# Patient Record
Sex: Male | Born: 1945 | Race: White | Hispanic: No | Marital: Married | Smoking: Former smoker
Health system: Southern US, Community
[De-identification: ages and names within clinical notes are randomized; demographics above are authoritative.]

## PROBLEM LIST (undated history)

## (undated) DIAGNOSIS — Z9289 Personal history of other medical treatment: Secondary | ICD-10-CM

## (undated) DIAGNOSIS — I509 Heart failure, unspecified: Secondary | ICD-10-CM

## (undated) DIAGNOSIS — E119 Type 2 diabetes mellitus without complications: Secondary | ICD-10-CM

## (undated) DIAGNOSIS — Z85828 Personal history of other malignant neoplasm of skin: Secondary | ICD-10-CM

## (undated) DIAGNOSIS — D649 Anemia, unspecified: Secondary | ICD-10-CM

## (undated) DIAGNOSIS — R0600 Dyspnea, unspecified: Secondary | ICD-10-CM

## (undated) DIAGNOSIS — E785 Hyperlipidemia, unspecified: Secondary | ICD-10-CM

## (undated) DIAGNOSIS — T8859XA Other complications of anesthesia, initial encounter: Secondary | ICD-10-CM

## (undated) DIAGNOSIS — Z8042 Family history of malignant neoplasm of prostate: Secondary | ICD-10-CM

## (undated) DIAGNOSIS — I480 Paroxysmal atrial fibrillation: Secondary | ICD-10-CM

## (undated) DIAGNOSIS — J189 Pneumonia, unspecified organism: Secondary | ICD-10-CM

## (undated) HISTORY — PX: BASAL CELL CARCINOMA EXCISION: SHX1214

## (undated) HISTORY — DX: Family history of malignant neoplasm of prostate: Z80.42

## (undated) HISTORY — PX: VASECTOMY: SHX75

## (undated) HISTORY — PX: COLONOSCOPY: SHX174

## (undated) HISTORY — DX: Type 2 diabetes mellitus without complications: E11.9

## (undated) HISTORY — DX: Personal history of other medical treatment: Z92.89

## (undated) HISTORY — PX: SKIN GRAFT: SHX250

## (undated) HISTORY — DX: Personal history of other malignant neoplasm of skin: Z85.828

## (undated) HISTORY — DX: Paroxysmal atrial fibrillation: I48.0

## (undated) HISTORY — DX: Hyperlipidemia, unspecified: E78.5

---

## 1991-12-14 HISTORY — PX: ANAL FISSURE REPAIR: SHX2312

## 2017-04-28 ENCOUNTER — Encounter: Payer: Self-pay | Admitting: Physician Assistant

## 2017-04-29 ENCOUNTER — Ambulatory Visit (INDEPENDENT_AMBULATORY_CARE_PROVIDER_SITE_OTHER): Payer: Medicare Other | Admitting: Physician Assistant

## 2017-04-29 ENCOUNTER — Encounter: Payer: Self-pay | Admitting: Physician Assistant

## 2017-04-29 ENCOUNTER — Encounter (INDEPENDENT_AMBULATORY_CARE_PROVIDER_SITE_OTHER): Payer: Self-pay

## 2017-04-29 VITALS — BP 136/70 | HR 73 | Ht 70.5 in | Wt 185.2 lb

## 2017-04-29 DIAGNOSIS — R0609 Other forms of dyspnea: Secondary | ICD-10-CM

## 2017-04-29 DIAGNOSIS — I480 Paroxysmal atrial fibrillation: Secondary | ICD-10-CM | POA: Diagnosis not present

## 2017-04-29 DIAGNOSIS — E119 Type 2 diabetes mellitus without complications: Secondary | ICD-10-CM

## 2017-04-29 DIAGNOSIS — R0683 Snoring: Secondary | ICD-10-CM

## 2017-04-29 MED ORDER — APIXABAN 5 MG PO TABS: 5.0000 mg | ORAL_TABLET | Freq: Two times a day (BID) | ORAL | 3 refills | Status: DC
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
## 2017-04-29 NOTE — Patient Instructions (Signed)
Medication Instructions:  1. STOP ASPIRIN   2. A REFILL HAS BEEN SENT IN FOR ELIQUIS   Labwork: 1. BMET, CBC TO BE DONE IN 6 WEEKS 06/10/17   Testing/Procedures: 1. Your physician has requested that you have an echocardiogram. Echocardiography is a painless test that uses sound waves to create images of your heart. It provides your doctor with information about the size and shape of your heart and how well your heart's chambers and valves are working. This procedure takes approximately one hour. There are no restrictions for this procedure.   2. Your physician has requested that you have en exercise stress myoview. For further information please visit [email or web address]. Please follow instruction sheet, as given.      Follow-Up: DR. Meda Coffee 3 MONTHS   Any Other Special Instructions Will Be Listed Below (If Applicable).     If you need a refill on your cardiac medications before your next appointment, please call your pharmacy.

## 2017-05-02 ENCOUNTER — Telehealth: Payer: Self-pay | Admitting: Physician Assistant

## 2017-05-02 MED ORDER — METOPROLOL SUCCINATE ER 25 MG PO TB24: 25.0000 mg | ORAL_TABLET | Freq: Every day | ORAL | 3 refills | Status: DC

## 2017-05-02 NOTE — Telephone Encounter (Signed)
I called pt and went over recommendations per Brynda Rim. PA of what would be acceptable for the pt to take for his cold. I did advise pt per PA to start Metoprolol Succinate 25 mg 1 tablet daily, Rx has been sent in to [facility] on [address]. Pt advised we do not need to schedule an appt for an EKG to be done in 2 weeks since he is having a Myoview and Echo 05/12/17. Advised per Brynda Rim. PA ok to continue with testing as planned on 05/12/17. Pt advised he can also check with pharmacist as to OTC cold medications and if so to be sure to let the pharm-d know that he has a hx of A-fib. Pt thanked me for my time and call tonight.

## 2017-05-02 NOTE — Telephone Encounter (Signed)
I called pt back in regards to his c/o HR still running high. I asked pt what has his heart rate been running. Pt states in the 80's and 90's. I did explain to the pt that these are normal ranges, normal range 60-100 BPM. I did ask pt has he had chest pain, sob, n/v, perfuse sweating, fevers, pt answered no not really. He said he has a cold right now that started about 2 days and so. I asked pt if taking any cold medicine, pt answered yes. Pt states he has taken Delsym cough syrup. I explained to the pt that some cold medicine contain an ingredient called guaifenesin which can raise heart rate and BP. I looked up Delsym and I did not see guaifenesin in this cough syrup. I stated to the pt that I will d/w Brynda Rim. PA for further advice about heart rate. I explained to the pt I will call back later today. Pt thanked me for my time and help in the matter. It was discussed Friday 04/29/17 of PA wanting to start pt on a low dose beta blocker, though pt declined at the time.

## 2017-05-02 NOTE — Telephone Encounter (Signed)
He is probably back in atrial fibrillation.  Guaifenesin will not increase blood pressure or heart rate.  However, I would avoid decongestants which can increase blood pressure and heart rate and may cause him to go back into atrial fibrillation.   Coricidin HBP is ok to use for cold symptoms. Robitussin with only guaifenesin or guaifenesin and dextromethorphan is ok. Tylenol, Flonase is ok. Do not take anything with phenylephrine or pseudoephedrine in it.  Some formulations of Delsym may have this. He can always ask the pharmacist about OTC medications and tell them that he has a hx of atrial fibrillation.   PLAN: 1. Start Metoprolol Succinate 25 mg Once daily. 2. Return in 2 weeks for ECG.  3. If his HR runs faster, call for sooner appointment. Richardson Dopp, PA-C    05/02/2017 5:20 PM

## 2017-05-02 NOTE — Telephone Encounter (Signed)
Follow Up:    Pt saw Scott on Friday,he wanted you to know that his pulse rate is still high. Please call to advise.

## 2017-05-10 ENCOUNTER — Telehealth (HOSPITAL_COMMUNITY): Payer: Self-pay | Admitting: *Deleted

## 2017-05-10 NOTE — Telephone Encounter (Signed)
Patient given detailed instructions per Myocardial Perfusion Study Information Sheet for the test on 05/12/17 at 0715. Patient notified to arrive 15 minutes early and that it is imperative to arrive on time for appointment to keep from having the test rescheduled.  If you need to cancel or reschedule your appointment, please call the office within 24 hours of your appointment. . Patient verbalized understanding.Amiere Cawley, Ranae Palms

## 2017-05-12 ENCOUNTER — Telehealth: Payer: Self-pay | Admitting: *Deleted

## 2017-05-12 ENCOUNTER — Ambulatory Visit (HOSPITAL_BASED_OUTPATIENT_CLINIC_OR_DEPARTMENT_OTHER): Payer: Medicare Other

## 2017-05-12 ENCOUNTER — Encounter: Payer: Self-pay | Admitting: Physician Assistant

## 2017-05-12 ENCOUNTER — Ambulatory Visit (HOSPITAL_COMMUNITY): Payer: Medicare Other | Attending: Internal Medicine

## 2017-05-12 ENCOUNTER — Other Ambulatory Visit: Payer: Self-pay

## 2017-05-12 DIAGNOSIS — R0609 Other forms of dyspnea: Secondary | ICD-10-CM | POA: Diagnosis not present

## 2017-05-12 DIAGNOSIS — I480 Paroxysmal atrial fibrillation: Secondary | ICD-10-CM | POA: Diagnosis present

## 2017-05-12 LAB — MYOCARDIAL PERFUSION IMAGING
CHL CUP NUCLEAR SDS: 3
CHL CUP RESTING HR STRESS: 70 {beats}/min
CSEPPHR: 141 {beats}/min
Estimated workload: 7 METS
Exercise duration (min): 6 min
Exercise duration (sec): 0 s
LVDIAVOL: 81 mL (ref 62–150)
LVSYSVOL: 32 mL
MPHR: 147 {beats}/min
Percent HR: 96 %
RATE: 0.29
RPE: 18
SRS: 9
SSS: 12
TID: 0.88

## 2017-05-12 LAB — ECHOCARDIOGRAM COMPLETE
HEIGHTINCHES: 70 in
WEIGHTICAEL: 2960 [oz_av]

## 2017-05-12 MED ORDER — REGADENOSON 0.4 MG/5ML IV SOLN: 0.4000 mg | Freq: Once | INTRAVENOUS | Status: AC

## 2017-05-12 MED ORDER — TECHNETIUM TC 99M TETROFOSMIN IV KIT
10.7000 | PACK | Freq: Once | INTRAVENOUS | Status: AC | PRN
  Administered 2017-05-12: 10.7 via INTRAVENOUS
  Filled 2017-05-12: qty 11

## 2017-05-12 MED ORDER — TECHNETIUM TC 99M TETROFOSMIN IV KIT
32.8000 | PACK | Freq: Once | INTRAVENOUS | Status: AC | PRN
  Administered 2017-05-12: 32.8 via INTRAVENOUS
  Filled 2017-05-12: qty 33

## 2017-05-12 NOTE — Telephone Encounter (Signed)
Lmtcb go over Myoview results.

## 2017-05-12 NOTE — Telephone Encounter (Signed)
-----   Message from Liliane Shi, [place] sent at 05/12/2017  2:03 PM EDT ----- Please call the patient. The Nuclear stress test is ok.  It does not show any ischemia (loss of blood flow from a blockage).   Continue current treatment plan.  Please fax a copy of this study result to his PCP:  Hulan Fess, MD  Thanks! Richardson Dopp, PA-C    05/12/2017 2:01 PM

## 2017-05-13 ENCOUNTER — Encounter: Payer: Self-pay | Admitting: Physician Assistant

## 2017-05-13 NOTE — Telephone Encounter (Signed)
-----   Message from Liliane Shi, [place] sent at 05/12/2017  2:03 PM EDT ----- Please call the patient. The Nuclear stress test is ok.  It does not show any ischemia (loss of blood flow from a blockage).   Continue current treatment plan.  Please fax a copy of this study result to his PCP:  Hulan Fess, MD  Thanks! Richardson Dopp, PA-C    05/12/2017 2:01 PM

## 2017-05-13 NOTE — Telephone Encounter (Signed)
Just wanted to try and reach pt to go over test results. No answer.

## 2017-05-13 NOTE — Telephone Encounter (Signed)
Lmtcb to go over Echo and Myoview results and recommendations.

## 2017-05-13 NOTE — Telephone Encounter (Signed)
-----   Message from Liliane Shi, [place] sent at 05/13/2017 10:49 AM EDT ----- Please call the patient. The echocardiogram shows normal ejection fraction.   The lung pressures are a little elevated.  Since he has had a hx of atrial fibrillation, I want him to get tested for obstructive sleep apnea. Please arrange Sleep Study. Please fax a copy of this study result to his PCP:  Hulan Fess, MD  Thanks! Richardson Dopp, PA-C    05/13/2017 10:43 AM

## 2017-05-16 ENCOUNTER — Telehealth: Payer: Self-pay | Admitting: Physician Assistant

## 2017-05-16 DIAGNOSIS — I480 Paroxysmal atrial fibrillation: Secondary | ICD-10-CM

## 2017-05-16 NOTE — Telephone Encounter (Signed)
Patient aware of results and recommendations. Patient verbalized understanding.   Sleep study order placed.  Patient request to call back when wife is available to get scheduled.

## 2017-05-16 NOTE — Telephone Encounter (Signed)
Patient calling, in regards to test results. Thanks.

## 2017-06-06 ENCOUNTER — Telehealth: Payer: Self-pay | Admitting: *Deleted

## 2017-06-06 ENCOUNTER — Other Ambulatory Visit: Payer: Medicare Other | Admitting: *Deleted

## 2017-06-06 DIAGNOSIS — R0609 Other forms of dyspnea: Principal | ICD-10-CM

## 2017-06-06 DIAGNOSIS — I480 Paroxysmal atrial fibrillation: Secondary | ICD-10-CM

## 2017-06-06 LAB — CBC
HEMOGLOBIN: 11.7 g/dL — AB (ref 13.0–17.7)
Hematocrit: 34.4 % — ABNORMAL LOW (ref 37.5–51.0)
MCH: 33 pg (ref 26.6–33.0)
MCHC: 34 g/dL (ref 31.5–35.7)
MCV: 97 fL (ref 79–97)
Platelets: 195 10*3/uL (ref 150–379)
RBC: 3.55 x10E6/uL — ABNORMAL LOW (ref 4.14–5.80)
RDW: 13.2 % (ref 12.3–15.4)
WBC: 5.3 10*3/uL (ref 3.4–10.8)

## 2017-06-06 LAB — BASIC METABOLIC PANEL
BUN/Creatinine Ratio: 16 (ref 10–24)
BUN: 17 mg/dL (ref 8–27)
CALCIUM: 9.8 mg/dL (ref 8.6–10.2)
CO2: 19 mmol/L — ABNORMAL LOW (ref 20–29)
CREATININE: 1.05 mg/dL (ref 0.76–1.27)
Chloride: 102 mmol/L (ref 96–106)
GFR, EST AFRICAN AMERICAN: 81 mL/min/{1.73_m2} (ref >59)
GFR, EST NON AFRICAN AMERICAN: 70 mL/min/{1.73_m2} (ref >59)
Glucose: 140 mg/dL — ABNORMAL HIGH (ref 65–99)
POTASSIUM: 4.5 mmol/L (ref 3.5–5.2)
SODIUM: 136 mmol/L (ref 134–144)

## 2017-06-06 NOTE — Telephone Encounter (Signed)
Lmtcb to go over lab results and recommendations to repeat cbc in 2 weeks.

## 2017-06-06 NOTE — Telephone Encounter (Signed)
-----   Message from Burtis Junes, NP sent at 06/06/2017  4:19 PM EDT ----- Ok to report. Reviewed for Richardson Dopp, PA His BMET is stable. The CBC is just slightly low - I do not see a baseline to compare to - needs repeat CBC in 2 weeks to recheck.  Otherwise would continue on current regimen.

## 2017-06-07 NOTE — Telephone Encounter (Signed)
Follow up    Pt is calling about results

## 2017-06-07 NOTE — Telephone Encounter (Signed)
Patient and wife informed and verbalized understanding of plan. 

## 2017-06-20 ENCOUNTER — Other Ambulatory Visit: Payer: Medicare Other

## 2017-06-20 ENCOUNTER — Encounter (INDEPENDENT_AMBULATORY_CARE_PROVIDER_SITE_OTHER): Payer: Self-pay

## 2017-06-20 DIAGNOSIS — I480 Paroxysmal atrial fibrillation: Secondary | ICD-10-CM

## 2017-06-21 ENCOUNTER — Telehealth: Payer: Self-pay | Admitting: *Deleted

## 2017-06-21 LAB — CBC
Hematocrit: 33.9 % — ABNORMAL LOW (ref 37.5–51.0)
Hemoglobin: 11.6 g/dL — ABNORMAL LOW (ref 13.0–17.7)
MCH: 32.2 pg (ref 26.6–33.0)
MCHC: 34.2 g/dL (ref 31.5–35.7)
MCV: 94 fL (ref 79–97)
PLATELETS: 224 10*3/uL (ref 150–379)
RBC: 3.6 x10E6/uL — ABNORMAL LOW (ref 4.14–5.80)
RDW: 13.8 % (ref 12.3–15.4)
WBC: 5.4 10*3/uL (ref 3.4–10.8)

## 2017-06-21 NOTE — Telephone Encounter (Signed)
-----   Message from Liliane Shi, [place] sent at 06/21/2017  7:55 AM EDT ----- Please call the patient The hemoglobin is stable. Continue with current treatment plan. Richardson Dopp, PA-C   06/21/2017 7:55 AM

## 2017-06-21 NOTE — Telephone Encounter (Signed)
Ptcb and has been notified of lab results and findings by phone with verbal understanding. I will forward lab results to PCP as well. Pt thanked me for my call today.

## 2017-11-29 ENCOUNTER — Encounter: Payer: Self-pay | Admitting: Cardiology

## 2017-11-29 ENCOUNTER — Ambulatory Visit: Payer: Medicare Other | Admitting: Cardiology

## 2017-11-29 VITALS — BP 136/72 | HR 72 | Ht 70.0 in | Wt 187.0 lb

## 2017-11-29 DIAGNOSIS — I480 Paroxysmal atrial fibrillation: Secondary | ICD-10-CM | POA: Diagnosis not present

## 2017-11-29 DIAGNOSIS — Z7901 Long term (current) use of anticoagulants: Secondary | ICD-10-CM

## 2017-11-29 DIAGNOSIS — I10 Essential (primary) hypertension: Secondary | ICD-10-CM

## 2017-11-29 NOTE — Patient Instructions (Signed)
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
## 2018-09-06 ENCOUNTER — Other Ambulatory Visit: Payer: Self-pay | Admitting: Physician Assistant

## 2018-09-06 DIAGNOSIS — I480 Paroxysmal atrial fibrillation: Secondary | ICD-10-CM

## 2018-09-07 NOTE — Telephone Encounter (Signed)
Pt last saw Dr Meda Coffee 11/29/17, last labs 05/17/18 Creat 1.03 at [place], age 72, weight 84.8kg, based on specified criteria pt is on appropriate dosage of Eliquis 5mg  BID, Will refill rx.

## 2018-10-12 ENCOUNTER — Encounter

## 2018-11-24 ENCOUNTER — Encounter: Payer: Self-pay | Admitting: Cardiology

## 2018-11-24 ENCOUNTER — Ambulatory Visit: Payer: Medicare Other | Admitting: Cardiology

## 2018-11-24 VITALS — BP 136/72 | HR 80 | Ht 70.0 in | Wt 183.0 lb

## 2018-11-24 DIAGNOSIS — I480 Paroxysmal atrial fibrillation: Secondary | ICD-10-CM

## 2018-11-24 DIAGNOSIS — R0600 Dyspnea, unspecified: Secondary | ICD-10-CM

## 2018-11-24 DIAGNOSIS — R0609 Other forms of dyspnea: Secondary | ICD-10-CM | POA: Diagnosis not present

## 2018-11-24 DIAGNOSIS — Z7901 Long term (current) use of anticoagulants: Secondary | ICD-10-CM

## 2018-11-24 MED ORDER — METOPROLOL SUCCINATE ER 25 MG PO TB24: 25.0000 mg | ORAL_TABLET | Freq: Every day | ORAL | 3 refills | Status: DC | PRN

## 2018-11-24 MED ORDER — ASPIRIN EC 81 MG PO TBEC: 81.0000 mg | DELAYED_RELEASE_TABLET | Freq: Every day | ORAL | 3 refills | Status: DC
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
## 2018-11-24 NOTE — Patient Instructions (Addendum)
Medication Instructions:  Your physician has recommended you make the following change in your medication:   STOP: eliquis  START: aspirin 81 mg daily  TAKE: metoprolol 25 mg daily AS NEEDED   If you need a refill on your cardiac medications before your next appointment, please call your pharmacy.   Lab work: None Ordered  If you have labs (blood work) drawn today and your tests are completely normal, you will receive your results only by: Marland Kitchen MyChart Message (if you have MyChart) OR . A paper copy in the mail If you have any lab test that is abnormal or we need to change your treatment, we will call you to review the results.  Testing/Procedures: None ordered  Follow-Up: At [facility], you and your health needs are our priority.  As part of our continuing mission to provide you with exceptional heart care, we have created designated Provider Care Teams.  These Care Teams include your primary Cardiologist (physician) and Advanced Practice Providers (APPs -  Physician Assistants and Nurse Practitioners) who all work together to provide you with the care you need, when you need it. . You will need a follow up appointment in 6 months.  Please call our office 2 months in advance to schedule this appointment.  You may see Ena Dawley, MD or one of the following Advanced Practice Providers on your designated Care Team:   . Lyda Jester, PA-C . Dayna Dunn, PA-C . Ermalinda Barrios, PA-C  Any Other Special Instructions Will Be Listed Below (If Applicable).

## 2019-01-04 ENCOUNTER — Other Ambulatory Visit: Payer: Self-pay | Admitting: Family Medicine

## 2019-01-04 DIAGNOSIS — Z87891 Personal history of nicotine dependence: Secondary | ICD-10-CM

## 2019-07-06 ENCOUNTER — Ambulatory Visit
Admission: RE | Admit: 2019-07-06 | Discharge: 2019-07-06 | Disposition: A | Discharge status: Home / Self Care | Payer: Medicare Other | Source: Ambulatory Visit | Attending: Family Medicine | Admitting: Family Medicine

## 2019-07-06 DIAGNOSIS — Z87891 Personal history of nicotine dependence: Secondary | ICD-10-CM

## 2019-11-15 ENCOUNTER — Ambulatory Visit: Payer: Medicare Other | Admitting: Cardiology

## 2019-11-20 ENCOUNTER — Other Ambulatory Visit: Payer: Self-pay

## 2019-11-20 ENCOUNTER — Ambulatory Visit: Payer: Medicare Other | Admitting: Cardiology

## 2019-11-20 ENCOUNTER — Encounter: Payer: Self-pay | Admitting: Cardiology

## 2019-11-20 VITALS — BP 134/74 | HR 90 | Ht 71.0 in | Wt 179.6 lb

## 2019-11-20 DIAGNOSIS — I480 Paroxysmal atrial fibrillation: Secondary | ICD-10-CM | POA: Diagnosis not present

## 2019-11-20 DIAGNOSIS — I10 Essential (primary) hypertension: Secondary | ICD-10-CM | POA: Diagnosis not present
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
## 2019-11-20 NOTE — Patient Instructions (Signed)
Medication Instructions:   Your physician recommends that you continue on your current medications as directed. Please refer to the Current Medication list given to you today.  *If you need a refill on your cardiac medications before your next appointment, please call your pharmacy*    Follow-Up: At [facility], you and your health needs are our priority.  As part of our continuing mission to provide you with exceptional heart care, we have created designated Provider Care Teams.  These Care Teams include your primary Cardiologist (physician) and Advanced Practice Providers (APPs -  Physician Assistants and Nurse Practitioners) who all work together to provide you with the care you need, when you need it.  Your next appointment:   12 month(s)  The format for your next appointment:   In Person  Provider:   Ena Dawley, MD

## 2020-01-26 ENCOUNTER — Ambulatory Visit: Payer: Medicare Other | Attending: Internal Medicine

## 2020-01-26 DIAGNOSIS — Z23 Encounter for immunization: Secondary | ICD-10-CM | POA: Insufficient documentation

## 2020-01-26 NOTE — Progress Notes (Signed)
   Covid-19 Vaccination Clinic  Name:  [name] [name]    MRN: [id number] DOB: 18-Oct-1946  01/26/2020  Mr. [name] [name] was observed post Covid-19 immunization for 15 minutes without incidence. He was provided with Vaccine Information Sheet and instruction to access the V-Safe system.   Mr. [name] [name] was instructed to call 911 with any severe reactions post vaccine: Marland Kitchen Difficulty breathing  . Swelling of your face and throat  . A fast heartbeat  . A bad rash all over your body  . Dizziness and weakness    Immunizations Administered    Name Date Dose VIS Date Route   Moderna COVID-19 Vaccine 01/26/2020  1:12 PM 0.5 mL 11/13/2019 Intramuscular   Manufacturer: Moderna   Lot: [id number]   [place]: 37366-815-94

## 2020-02-23 ENCOUNTER — Ambulatory Visit: Payer: Medicare Other | Attending: Internal Medicine

## 2020-02-23 DIAGNOSIS — Z23 Encounter for immunization: Secondary | ICD-10-CM

## 2020-02-23 NOTE — Progress Notes (Signed)
   Covid-19 Vaccination Clinic  Name:  Rooney Swails    MRN: [id number] DOB: 04-24-1946  02/23/2020  Mr. Brizzi was observed post Covid-19 immunization for 15 minutes without incident. He was provided with Vaccine Information Sheet and instruction to access the V-Safe system.   Mr. Smethers was instructed to call 911 with any severe reactions post vaccine: Marland Kitchen Difficulty breathing  . Swelling of face and throat  . A fast heartbeat  . A bad rash all over body  . Dizziness and weakness   Immunizations Administered    Name Date Dose VIS Date Route   Pfizer COVID-19 Vaccine 02/23/2020 11:14 AM 0.3 mL 11/23/2019 Intramuscular   Manufacturer: [place]   Lot: [id number]   [place]: [id number]

## 2021-10-21 ENCOUNTER — Other Ambulatory Visit: Payer: Self-pay | Admitting: Internal Medicine

## 2021-10-21 ENCOUNTER — Other Ambulatory Visit (HOSPITAL_COMMUNITY): Payer: Self-pay | Admitting: Internal Medicine

## 2021-10-21 DIAGNOSIS — R918 Other nonspecific abnormal finding of lung field: Secondary | ICD-10-CM

## 2021-10-22 ENCOUNTER — Other Ambulatory Visit: Payer: Self-pay

## 2021-10-22 ENCOUNTER — Ambulatory Visit (HOSPITAL_COMMUNITY)
Admission: RE | Admit: 2021-10-22 | Discharge: 2021-10-22 | Disposition: A | Discharge status: Home / Self Care | Payer: Medicare Other | Source: Ambulatory Visit | Attending: Internal Medicine | Admitting: Internal Medicine

## 2021-10-22 DIAGNOSIS — R918 Other nonspecific abnormal finding of lung field: Secondary | ICD-10-CM | POA: Diagnosis present

## 2021-10-22 LAB — POCT I-STAT CREATININE: Creatinine, Ser: 0.8 mg/dL (ref 0.61–1.24)

## 2021-10-22 MED ORDER — IOHEXOL 350 MG/ML SOLN
60.0000 mL | Freq: Once | INTRAVENOUS | Status: AC | PRN
  Administered 2021-10-22: 60 mL via INTRAVENOUS

## 2021-10-23 ENCOUNTER — Emergency Department (HOSPITAL_COMMUNITY): Payer: Medicare Other

## 2021-10-23 ENCOUNTER — Emergency Department (HOSPITAL_COMMUNITY)
Admission: EM | Admit: 2021-10-23 | Discharge: 2021-10-23 | Disposition: A | Discharge status: Home / Self Care | Payer: Medicare Other | Attending: Emergency Medicine | Admitting: Emergency Medicine

## 2021-10-23 ENCOUNTER — Other Ambulatory Visit: Payer: Self-pay

## 2021-10-23 ENCOUNTER — Encounter (HOSPITAL_COMMUNITY): Payer: Self-pay

## 2021-10-23 DIAGNOSIS — Z7982 Long term (current) use of aspirin: Secondary | ICD-10-CM | POA: Insufficient documentation

## 2021-10-23 DIAGNOSIS — R0602 Shortness of breath: Secondary | ICD-10-CM | POA: Insufficient documentation

## 2021-10-23 DIAGNOSIS — Z85118 Personal history of other malignant neoplasm of bronchus and lung: Secondary | ICD-10-CM | POA: Insufficient documentation

## 2021-10-23 DIAGNOSIS — E785 Hyperlipidemia, unspecified: Secondary | ICD-10-CM | POA: Insufficient documentation

## 2021-10-23 DIAGNOSIS — R918 Other nonspecific abnormal finding of lung field: Secondary | ICD-10-CM

## 2021-10-23 DIAGNOSIS — E1169 Type 2 diabetes mellitus with other specified complication: Secondary | ICD-10-CM | POA: Diagnosis not present

## 2021-10-23 DIAGNOSIS — Z7984 Long term (current) use of oral hypoglycemic drugs: Secondary | ICD-10-CM | POA: Insufficient documentation

## 2021-10-23 DIAGNOSIS — Z87891 Personal history of nicotine dependence: Secondary | ICD-10-CM | POA: Insufficient documentation

## 2021-10-23 DIAGNOSIS — R030 Elevated blood-pressure reading, without diagnosis of hypertension: Secondary | ICD-10-CM

## 2021-10-23 DIAGNOSIS — J9811 Atelectasis: Secondary | ICD-10-CM

## 2021-10-23 DIAGNOSIS — R051 Acute cough: Secondary | ICD-10-CM

## 2021-10-23 NOTE — Discharge Instructions (Addendum)
It was our pleasure to provide your ER care today - we hope that you feel better.  Try mucinex or robitussin to see if helps symptoms. Also try resting/sleeping in different positions to see if there is one position that is more comfortable in terms of your coughing/breathing.   Follow up closely with your doctor this coming week to discuss plan for cancer management. Also have your blood pressure rechecked then, as it is high tonight.   Return to ER if worse, new symptoms, fevers, increased trouble breathing, new/severe pain, or other concern.
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
## 2021-10-26 ENCOUNTER — Encounter: Payer: Self-pay | Admitting: *Deleted

## 2021-10-26 ENCOUNTER — Telehealth: Payer: Self-pay | Admitting: Internal Medicine

## 2021-10-26 DIAGNOSIS — R918 Other nonspecific abnormal finding of lung field: Secondary | ICD-10-CM

## 2021-10-26 NOTE — Telephone Encounter (Signed)
Scheduled appt per 11/11 referral. Pt is aware of appt date and time.  

## 2021-10-26 NOTE — Progress Notes (Signed)
I received new patient referral on [name] [name].  I notified new patient coordinator to call and schedule him to be seen on 11/18 at 9 with labs before.

## 2021-10-28 ENCOUNTER — Emergency Department (HOSPITAL_COMMUNITY)
Admission: EM | Admit: 2021-10-28 | Discharge: 2021-10-28 | Disposition: A | Discharge status: Home / Self Care | Payer: Medicare Other | Attending: Emergency Medicine | Admitting: Emergency Medicine

## 2021-10-28 ENCOUNTER — Emergency Department (HOSPITAL_COMMUNITY): Payer: Medicare Other

## 2021-10-28 ENCOUNTER — Encounter (HOSPITAL_COMMUNITY): Payer: Self-pay

## 2021-10-28 DIAGNOSIS — Z85118 Personal history of other malignant neoplasm of bronchus and lung: Secondary | ICD-10-CM | POA: Diagnosis not present

## 2021-10-28 DIAGNOSIS — Z87891 Personal history of nicotine dependence: Secondary | ICD-10-CM | POA: Insufficient documentation

## 2021-10-28 DIAGNOSIS — Z7984 Long term (current) use of oral hypoglycemic drugs: Secondary | ICD-10-CM | POA: Diagnosis not present

## 2021-10-28 DIAGNOSIS — J189 Pneumonia, unspecified organism: Secondary | ICD-10-CM | POA: Diagnosis not present

## 2021-10-28 DIAGNOSIS — E119 Type 2 diabetes mellitus without complications: Secondary | ICD-10-CM | POA: Diagnosis not present

## 2021-10-28 DIAGNOSIS — R0602 Shortness of breath: Secondary | ICD-10-CM | POA: Diagnosis present

## 2021-10-28 LAB — BASIC METABOLIC PANEL
Anion gap: 13 (ref 5–15)
BUN: 13 mg/dL (ref 8–23)
CO2: 23 mmol/L (ref 22–32)
Calcium: 9.2 mg/dL (ref 8.9–10.3)
Chloride: 91 mmol/L — ABNORMAL LOW (ref 98–111)
Creatinine, Ser: 0.81 mg/dL (ref 0.61–1.24)
GFR, Estimated: >60 mL/min (ref >60)
Glucose, Bld: 182 mg/dL — ABNORMAL HIGH (ref 70–99)
Potassium: 4.4 mmol/L (ref 3.5–5.1)
Sodium: 127 mmol/L — ABNORMAL LOW (ref 135–145)

## 2021-10-28 LAB — CBC WITH DIFFERENTIAL/PLATELET
Abs Immature Granulocytes: 0.05 10*3/uL (ref 0.00–0.07)
Basophils Absolute: 0 10*3/uL (ref 0.0–0.1)
Basophils Relative: 0 %
Eosinophils Absolute: 0 10*3/uL (ref 0.0–0.5)
Eosinophils Relative: 1 %
HCT: 32.4 % — ABNORMAL LOW (ref 39.0–52.0)
Hemoglobin: 11 g/dL — ABNORMAL LOW (ref 13.0–17.0)
Immature Granulocytes: 1 %
Lymphocytes Relative: 13 %
Lymphs Abs: 0.8 10*3/uL (ref 0.7–4.0)
MCH: 32.6 pg (ref 26.0–34.0)
MCHC: 34 g/dL (ref 30.0–36.0)
MCV: 96.1 fL (ref 80.0–100.0)
Monocytes Absolute: 0.6 10*3/uL (ref 0.1–1.0)
Monocytes Relative: 9 %
Neutro Abs: 4.9 10*3/uL (ref 1.7–7.7)
Neutrophils Relative %: 76 %
Platelets: 374 10*3/uL (ref 150–400)
RBC: 3.37 MIL/uL — ABNORMAL LOW (ref 4.22–5.81)
RDW: 11.5 % (ref 11.5–15.5)
WBC: 6.4 10*3/uL (ref 4.0–10.5)
nRBC: 0 % (ref 0.0–0.2)

## 2021-10-28 LAB — TROPONIN I (HIGH SENSITIVITY): Troponin I (High Sensitivity): 12 ng/L (ref <18)

## 2021-10-28 LAB — BRAIN NATRIURETIC PEPTIDE: B Natriuretic Peptide: 67.1 pg/mL (ref 0.0–100.0)

## 2021-10-28 MED ORDER — SODIUM CHLORIDE 0.9 % IV SOLN
1.0000 g | Freq: Once | INTRAVENOUS | Status: AC
  Administered 2021-10-28: 1 g via INTRAVENOUS
  Filled 2021-10-28: qty 10

## 2021-10-28 MED ORDER — IOHEXOL 350 MG/ML SOLN
80.0000 mL | Freq: Once | INTRAVENOUS | Status: AC | PRN
  Administered 2021-10-28: 80 mL via INTRAVENOUS

## 2021-10-28 MED ORDER — DOXYCYCLINE HYCLATE 100 MG PO CAPS: 100.0000 mg | ORAL_CAPSULE | Freq: Two times a day (BID) | ORAL | 0 refills | Status: AC

## 2021-10-28 MED ORDER — AMOXICILLIN-POT CLAVULANATE 875-125 MG PO TABS: 1.0000 | ORAL_TABLET | Freq: Two times a day (BID) | ORAL | 0 refills | Status: AC
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
## 2021-10-28 NOTE — ED Provider Notes (Signed)
Emergency Medicine Provider Triage Evaluation Note  [name] [name] , a 75 y.o. male  was evaluated in triage.  Pt complains of sob that has been ongoing for 3 days but worsened earlier today. He was recently dx with lung CA.   Review of Systems  Positive: Sob, cough Negative: Chest pain  Physical Exam  BP (!) 173/89 (BP Location: Left Arm)   Pulse 98   Temp 97.6 F (36.4 C) (Oral)   Resp 18   Ht 5\' 11"  (1.803 m)   Wt 70.3 kg   SpO2 100%   BMI 21.62 kg/m  Gen:   Awake, no distress   Resp:  Normal effort  MSK:   Moves extremities without difficulty  Other:  Decreased lung sounds throughout  Medical Decision Making  Medically screening exam initiated at 1:43 PM.  Appropriate orders placed.  [name] [name] was informed that the remainder of the evaluation will be completed by another provider, this initial triage assessment does not replace that evaluation, and the importance of remaining in the ED until their evaluation is complete.     Rodney Booze, [place] 10/28/21 1347    Valarie Merino, MD 10/31/21 1550

## 2021-10-28 NOTE — ED Notes (Signed)
Pt ambulated self to the bathroom. Oxy sats maintained at 98-100% on Rm Air

## 2021-10-28 NOTE — ED Notes (Signed)
Patient transported to CT 

## 2021-10-28 NOTE — ED Triage Notes (Signed)
Pt presents with c/o shortness of breath. Pt reports that he was diagnosed with lung cancer on Friday and just feels like he is having a hard time catching his breath. Pt is alert and oriented, does have some observed labored breathing but is able to talk in complete sentences.

## 2021-10-28 NOTE — ED Notes (Addendum)
Pt ambulated with staff from waiting room independently with no visible distress noted.Oxygen sats 98% on Rm Air when placed on monitor in room.

## 2021-10-28 NOTE — Discharge Instructions (Addendum)
Please pick up antibiotics and take as prescribed to cover for a pneumonia  Follow up with Dr. Earlie Server as scheduled on Friday  Return to the ED for any new/worsening symptoms

## 2021-10-28 NOTE — ED Notes (Signed)
Patient informed staff that he felt like he was having difficulty breathing. This writer took pt's SpO2, pt's O2 sats are 100% on RA. RN aware, will continue to monitor pt.

## 2021-10-30 ENCOUNTER — Encounter: Payer: Self-pay | Admitting: Internal Medicine

## 2021-10-30 ENCOUNTER — Inpatient Hospital Stay (HOSPITAL_BASED_OUTPATIENT_CLINIC_OR_DEPARTMENT_OTHER): Payer: Medicare Other | Admitting: *Deleted

## 2021-10-30 ENCOUNTER — Inpatient Hospital Stay: Payer: Medicare Other | Admitting: Internal Medicine

## 2021-10-30 ENCOUNTER — Inpatient Hospital Stay: Payer: Medicare Other | Attending: Internal Medicine

## 2021-10-30 ENCOUNTER — Other Ambulatory Visit: Payer: Self-pay

## 2021-10-30 VITALS — BP 144/68 | HR 107 | Temp 97.6°F | Resp 18 | Wt 153.2 lb

## 2021-10-30 DIAGNOSIS — R918 Other nonspecific abnormal finding of lung field: Secondary | ICD-10-CM

## 2021-10-30 DIAGNOSIS — C342 Malignant neoplasm of middle lobe, bronchus or lung: Secondary | ICD-10-CM | POA: Insufficient documentation

## 2021-10-30 DIAGNOSIS — C3490 Malignant neoplasm of unspecified part of unspecified bronchus or lung: Secondary | ICD-10-CM

## 2021-10-30 LAB — CMP (CANCER CENTER ONLY)
ALT: 15 U/L (ref 0–44)
AST: 17 U/L (ref 15–41)
Albumin: 3.4 g/dL — ABNORMAL LOW (ref 3.5–5.0)
Alkaline Phosphatase: 60 U/L (ref 38–126)
Anion gap: 15 (ref 5–15)
BUN: 14 mg/dL (ref 8–23)
CO2: 21 mmol/L — ABNORMAL LOW (ref 22–32)
Calcium: 9.6 mg/dL (ref 8.9–10.3)
Chloride: 95 mmol/L — ABNORMAL LOW (ref 98–111)
Creatinine: 1.06 mg/dL (ref 0.61–1.24)
GFR, Estimated: >60 mL/min (ref >60)
Glucose, Bld: 239 mg/dL — ABNORMAL HIGH (ref 70–99)
Potassium: 4.5 mmol/L (ref 3.5–5.1)
Sodium: 131 mmol/L — ABNORMAL LOW (ref 135–145)
Total Bilirubin: 0.5 mg/dL (ref 0.3–1.2)
Total Protein: 7.5 g/dL (ref 6.5–8.1)

## 2021-10-30 LAB — CBC WITH DIFFERENTIAL (CANCER CENTER ONLY)
Abs Immature Granulocytes: 0.08 10*3/uL — ABNORMAL HIGH (ref 0.00–0.07)
Basophils Absolute: 0 10*3/uL (ref 0.0–0.1)
Basophils Relative: 0 %
Eosinophils Absolute: 0 10*3/uL (ref 0.0–0.5)
Eosinophils Relative: 0 %
HCT: 31.5 % — ABNORMAL LOW (ref 39.0–52.0)
Hemoglobin: 10.5 g/dL — ABNORMAL LOW (ref 13.0–17.0)
Immature Granulocytes: 1 %
Lymphocytes Relative: 10 %
Lymphs Abs: 0.9 10*3/uL (ref 0.7–4.0)
MCH: 32.2 pg (ref 26.0–34.0)
MCHC: 33.3 g/dL (ref 30.0–36.0)
MCV: 96.6 fL (ref 80.0–100.0)
Monocytes Absolute: 0.9 10*3/uL (ref 0.1–1.0)
Monocytes Relative: 9 %
Neutro Abs: 7.4 10*3/uL (ref 1.7–7.7)
Neutrophils Relative %: 80 %
Platelet Count: 349 10*3/uL (ref 150–400)
RBC: 3.26 MIL/uL — ABNORMAL LOW (ref 4.22–5.81)
RDW: 11.4 % — ABNORMAL LOW (ref 11.5–15.5)
WBC Count: 9.4 10*3/uL (ref 4.0–10.5)
nRBC: 0 % (ref 0.0–0.2)
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
## 2021-10-30 NOTE — Progress Notes (Signed)
Oncology Nurse Navigator Documentation  Oncology Nurse Navigator Flowsheets 10/30/2021  Abnormal Finding Date 10/28/2021  Diagnosis Status Additional Work Up  Navigator Follow Up Date: 11/03/2021  Navigator Follow Up Reason: Appointment Review  Navigator Location [facility]  Navigator Encounter Type Clinic/MDC;Initial MedOnc  Patient Visit Type Initial;MedOnc/I spoke to [name] today during his first visit to see Dr. [name].  Per Dr. [name], patient has cancer of unknown primary and needs further workup.  I spoke to him about treatment plan with scans and referral to pulmonary for bx.    Treatment Phase Abnormal Scans  Barriers/Navigation Needs Coordination of Care;Education  Education Other  Interventions Coordination of Care;Education;Psycho-Social Support  Acuity Level 2-Minimal Needs (1-2 Barriers Identified)  Coordination of Care Other  Education Method Verbal;Written  Time Spent with Patient 26

## 2021-11-02 ENCOUNTER — Telehealth: Payer: Self-pay | Admitting: Medical Oncology

## 2021-11-02 ENCOUNTER — Telehealth: Payer: Self-pay | Admitting: Pulmonary Disease

## 2021-11-02 ENCOUNTER — Ambulatory Visit: Payer: Medicare Other | Admitting: Pulmonary Disease

## 2021-11-02 ENCOUNTER — Other Ambulatory Visit: Payer: Self-pay

## 2021-11-02 ENCOUNTER — Encounter: Payer: Self-pay | Admitting: Pulmonary Disease

## 2021-11-02 ENCOUNTER — Other Ambulatory Visit: Payer: Self-pay | Admitting: Internal Medicine

## 2021-11-02 VITALS — BP 112/76 | HR 103 | Temp 97.6°F | Ht 71.0 in | Wt 157.0 lb

## 2021-11-02 DIAGNOSIS — R918 Other nonspecific abnormal finding of lung field: Secondary | ICD-10-CM | POA: Diagnosis not present

## 2021-11-02 DIAGNOSIS — Z87891 Personal history of nicotine dependence: Secondary | ICD-10-CM

## 2021-11-02 DIAGNOSIS — R590 Localized enlarged lymph nodes: Secondary | ICD-10-CM | POA: Diagnosis not present

## 2021-11-02 MED ORDER — LORAZEPAM 0.5 MG PO TABS: ORAL_TABLET | ORAL | 0 refills | Status: DC

## 2021-11-02 NOTE — Telephone Encounter (Signed)
Pt is claustrophobic. He would like something to calm /relax him .
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
## 2021-11-02 NOTE — Patient Instructions (Addendum)
Thank you for visiting Dr. Valeta Harms at [facility]. Today we recommend the following:  Orders Placed This Encounter  Procedures   Procedural/ Surgical Case Request: ROBOTIC ASSISTED NAVIGATIONAL BRONCHOSCOPY, VIDEO BRONCHOSCOPY WITH ENDOBRONCHIAL ULTRASOUND   Ambulatory referral to Pulmonology   Bronchoscopy on 11/09/2021 with Dr. Lamonte Sakai  Return in about 2 weeks (around 11/16/2021) for with Eric Form, NP, or Dr. Lamonte Sakai .    Please do your part to reduce the spread of COVID-19.

## 2021-11-02 NOTE — Telephone Encounter (Addendum)
The following has been scheduled & patient has been made aware:   ENB/EBUS on 11/28 @ 8:45 - pt to check in by 5:30 - will need COVID Test completed that morning.  (Drive thru site is closed on 11/25 d/t thanksgiving holiday).   Spoke to [place] w/ [facility] & requested a super D Disk for CT completed on 11/10 to be sent to [facility] Attn:  Vista Lawman.   No medication restrictions listed.

## 2021-11-03 ENCOUNTER — Encounter (HOSPITAL_COMMUNITY): Payer: Self-pay | Admitting: Emergency Medicine

## 2021-11-03 ENCOUNTER — Ambulatory Visit (HOSPITAL_COMMUNITY)
Admission: RE | Admit: 2021-11-03 | Discharge: 2021-11-03 | Disposition: A | Discharge status: Home / Self Care | Payer: Medicare Other | Source: Ambulatory Visit | Attending: Internal Medicine | Admitting: Internal Medicine

## 2021-11-03 ENCOUNTER — Other Ambulatory Visit: Payer: Self-pay

## 2021-11-03 DIAGNOSIS — C3490 Malignant neoplasm of unspecified part of unspecified bronchus or lung: Secondary | ICD-10-CM | POA: Insufficient documentation

## 2021-11-03 MED ORDER — GADOBUTROL 1 MMOL/ML IV SOLN
7.5000 mL | Freq: Once | INTRAVENOUS | Status: AC | PRN
  Administered 2021-11-03: 7.5 mL via INTRAVENOUS

## 2021-11-03 NOTE — Progress Notes (Addendum)
[name] [name] denies chest pain and shortness of breath at this time.  [name] [name] was short of breath 10/28/21 and was coughing a lot, patient was seen in the ED and diagnosed with pneumonia. Patient denies having any s/s of Covid in his household.  Patient denies any known exposure to Covid.   [name] [name] has type II diabetes, patient  states he checks CBG every morning, it runs around 170. I instructed [name] [name] to not take any medication for diabetes on Monday morning. I instructed patient to check CBG after awaking and every 2 hours until arrival  to the hospital.  I Instructed patient if CBG is less than 70 to take 4 Glucose Tablets or 1 tube of Glucose Gel or 1/2 cup of a clear juice. Recheck CBG in 15 minutes if CBG is not over 70 call, pre- op desk at [phone] for further instructions.   [name] [name] will come to have Covid test Friday, 11/25 /22 at 0900..  I instructed patient to shower with antibiotic soap, if it is available.  Dry off with a clean towel. Do not put lotion, powder, cologne or deodorant or makeup.No jewelry or piercings. Men may shave their face and neck. Woman should not shave. No nail polish, artificial or acrylic nails. Wear clean clothes, brush your teeth. Glasses, contact lens,dentures or partials may not be worn in the OR. If you need to wear them, please bring a case for glasses, do not wear contacts or bring a case, the hospital does not have contact cases, dentures or partials will have to be removed , make sure they are clean, we will provide a denture cup to put them in. You will need some one to drive you home and a responsible person over the age of 65 to stay with you for the first 24 hours after surgery.   PCP is Dr. Domenick Gong.

## 2021-11-04 ENCOUNTER — Encounter: Payer: Self-pay | Admitting: *Deleted

## 2021-11-04 ENCOUNTER — Ambulatory Visit (HOSPITAL_COMMUNITY)
Admission: RE | Admit: 2021-11-04 | Discharge: 2021-11-04 | Disposition: A | Discharge status: Home / Self Care | Payer: Medicare Other | Source: Ambulatory Visit | Attending: Internal Medicine | Admitting: Internal Medicine

## 2021-11-04 DIAGNOSIS — C3490 Malignant neoplasm of unspecified part of unspecified bronchus or lung: Secondary | ICD-10-CM | POA: Diagnosis not present

## 2021-11-04 LAB — GLUCOSE, CAPILLARY: Glucose-Capillary: 132 mg/dL — ABNORMAL HIGH (ref 70–99)

## 2021-11-04 MED ORDER — FLUDEOXYGLUCOSE F - 18 (FDG) INJECTION
8.1000 | Freq: Once | INTRAVENOUS | Status: AC
  Administered 2021-11-04: 7.49 via INTRAVENOUS

## 2021-11-04 NOTE — Progress Notes (Signed)
Oncology Nurse Navigator Documentation  Oncology Nurse Navigator Flowsheets 11/04/2021 10/30/2021  Abnormal Finding Date - 10/28/2021  Diagnosis Status - Additional Work Up  Navigator Follow Up Date: 11/12/2021 11/03/2021  Navigator Follow Up Reason: Follow-up After Biopsy Appointment Review  Navigator Location [facility]  Navigator Encounter Type Other: Clinic/MDC;Initial MedOnc  Patient Visit Type Other Initial;MedOnc  Treatment Phase Abnormal Scans Abnormal Scans  Barriers/Navigation Needs Coordination of Care Coordination of Care;Education  Education - Other  Interventions Coordination of Care/I followed up on Mr. Mestas treatment plan. He is had MRI brain yesterday, PET today, bronchoscopy on 11/28, and will see Dr. Tammi Klippel and Dr. [name] next week.  He plan is set up at this time.   Coordination of Care;Education;Psycho-Social Support  Acuity Level 2-Minimal Needs (1-2 Barriers Identified) Level 2-Minimal Needs (1-2 Barriers Identified)  Coordination of Care Other Other  Education Method - Verbal;Written  Time Spent with Patient [phone]

## 2021-11-06 ENCOUNTER — Other Ambulatory Visit (HOSPITAL_COMMUNITY)
Admission: RE | Admit: 2021-11-06 | Discharge: 2021-11-06 | Disposition: A | Discharge status: Home / Self Care | Payer: Medicare Other | Source: Ambulatory Visit | Attending: Emergency Medicine | Admitting: Emergency Medicine

## 2021-11-06 DIAGNOSIS — Z01812 Encounter for preprocedural laboratory examination: Secondary | ICD-10-CM | POA: Diagnosis present

## 2021-11-06 DIAGNOSIS — Z01818 Encounter for other preprocedural examination: Secondary | ICD-10-CM

## 2021-11-06 DIAGNOSIS — Z20822 Contact with and (suspected) exposure to covid-19: Secondary | ICD-10-CM | POA: Diagnosis not present

## 2021-11-06 LAB — SARS CORONAVIRUS 2 (TAT 6-24 HRS): SARS Coronavirus 2: NEGATIVE

## 2021-11-09 ENCOUNTER — Ambulatory Visit (HOSPITAL_COMMUNITY): Payer: Medicare Other | Admitting: Certified Registered"

## 2021-11-09 ENCOUNTER — Other Ambulatory Visit: Payer: Self-pay

## 2021-11-09 ENCOUNTER — Ambulatory Visit (HOSPITAL_COMMUNITY)
Admission: RE | Admit: 2021-11-09 | Discharge: 2021-11-09 | Disposition: A | Discharge status: Home / Self Care | Payer: Medicare Other | Attending: Emergency Medicine | Admitting: Emergency Medicine

## 2021-11-09 ENCOUNTER — Ambulatory Visit (HOSPITAL_COMMUNITY): Payer: Medicare Other

## 2021-11-09 ENCOUNTER — Encounter (HOSPITAL_COMMUNITY): Payer: Self-pay | Admitting: Emergency Medicine

## 2021-11-09 ENCOUNTER — Encounter (HOSPITAL_COMMUNITY)
Admission: RE | Disposition: A | Discharge status: Home / Self Care | Payer: Self-pay | Source: Home / Self Care | Attending: Emergency Medicine

## 2021-11-09 DIAGNOSIS — Z7984 Long term (current) use of oral hypoglycemic drugs: Secondary | ICD-10-CM | POA: Insufficient documentation

## 2021-11-09 DIAGNOSIS — C342 Malignant neoplasm of middle lobe, bronchus or lung: Secondary | ICD-10-CM | POA: Insufficient documentation

## 2021-11-09 DIAGNOSIS — R918 Other nonspecific abnormal finding of lung field: Secondary | ICD-10-CM | POA: Diagnosis present

## 2021-11-09 DIAGNOSIS — R590 Localized enlarged lymph nodes: Secondary | ICD-10-CM | POA: Diagnosis not present

## 2021-11-09 DIAGNOSIS — C3411 Malignant neoplasm of upper lobe, right bronchus or lung: Secondary | ICD-10-CM | POA: Insufficient documentation

## 2021-11-09 DIAGNOSIS — Z419 Encounter for procedure for purposes other than remedying health state, unspecified: Secondary | ICD-10-CM

## 2021-11-09 DIAGNOSIS — Z87891 Personal history of nicotine dependence: Secondary | ICD-10-CM | POA: Diagnosis not present

## 2021-11-09 DIAGNOSIS — E785 Hyperlipidemia, unspecified: Secondary | ICD-10-CM | POA: Diagnosis not present

## 2021-11-09 DIAGNOSIS — E119 Type 2 diabetes mellitus without complications: Secondary | ICD-10-CM | POA: Insufficient documentation

## 2021-11-09 DIAGNOSIS — D649 Anemia, unspecified: Secondary | ICD-10-CM | POA: Insufficient documentation

## 2021-11-09 DIAGNOSIS — Z9889 Other specified postprocedural states: Secondary | ICD-10-CM

## 2021-11-09 HISTORY — DX: Pneumonia, unspecified organism: J18.9

## 2021-11-09 HISTORY — DX: Other complications of anesthesia, initial encounter: T88.59XA

## 2021-11-09 HISTORY — PX: BRONCHIAL BIOPSY: SHX5109

## 2021-11-09 HISTORY — DX: Heart failure, unspecified: I50.9

## 2021-11-09 HISTORY — PX: BRONCHIAL BRUSHINGS: SHX5108

## 2021-11-09 HISTORY — DX: Dyspnea, unspecified: R06.00

## 2021-11-09 HISTORY — PX: VIDEO BRONCHOSCOPY WITH RADIAL ENDOBRONCHIAL ULTRASOUND: SHX6849

## 2021-11-09 HISTORY — DX: Anemia, unspecified: D64.9

## 2021-11-09 HISTORY — PX: VIDEO BRONCHOSCOPY WITH ENDOBRONCHIAL ULTRASOUND: SHX6177

## 2021-11-09 HISTORY — PX: BRONCHIAL NEEDLE ASPIRATION BIOPSY: SHX5106

## 2021-11-09 LAB — COMPREHENSIVE METABOLIC PANEL
ALT: 16 U/L (ref 0–44)
AST: 16 U/L (ref 15–41)
Albumin: 2.9 g/dL — ABNORMAL LOW (ref 3.5–5.0)
Alkaline Phosphatase: 43 U/L (ref 38–126)
Anion gap: 9 (ref 5–15)
BUN: 17 mg/dL (ref 8–23)
CO2: 25 mmol/L (ref 22–32)
Calcium: 9 mg/dL (ref 8.9–10.3)
Chloride: 96 mmol/L — ABNORMAL LOW (ref 98–111)
Creatinine, Ser: 0.86 mg/dL (ref 0.61–1.24)
GFR, Estimated: >60 mL/min (ref >60)
Glucose, Bld: 213 mg/dL — ABNORMAL HIGH (ref 70–99)
Potassium: 4 mmol/L (ref 3.5–5.1)
Sodium: 130 mmol/L — ABNORMAL LOW (ref 135–145)
Total Bilirubin: 0.6 mg/dL (ref 0.3–1.2)
Total Protein: 6.1 g/dL — ABNORMAL LOW (ref 6.5–8.1)

## 2021-11-09 LAB — CBC
HCT: 28.6 % — ABNORMAL LOW (ref 39.0–52.0)
Hemoglobin: 9.7 g/dL — ABNORMAL LOW (ref 13.0–17.0)
MCH: 33.2 pg (ref 26.0–34.0)
MCHC: 33.9 g/dL (ref 30.0–36.0)
MCV: 97.9 fL (ref 80.0–100.0)
Platelets: 282 10*3/uL (ref 150–400)
RBC: 2.92 MIL/uL — ABNORMAL LOW (ref 4.22–5.81)
RDW: 11.9 % (ref 11.5–15.5)
WBC: 5.6 10*3/uL (ref 4.0–10.5)
nRBC: 0 % (ref 0.0–0.2)

## 2021-11-09 LAB — GLUCOSE, CAPILLARY
Glucose-Capillary: 225 mg/dL — ABNORMAL HIGH (ref 70–99)
Glucose-Capillary: 194 mg/dL — ABNORMAL HIGH (ref 70–99)

## 2021-11-09 SURGERY — BRONCHOSCOPY, WITH BIOPSY USING ELECTROMAGNETIC NAVIGATION
Anesthesia: General | Laterality: Right

## 2021-11-09 MED ORDER — ONDANSETRON HCL 4 MG/2ML IJ SOLN
INTRAMUSCULAR | Status: DC | PRN
  Administered 2021-11-09: 4 mg via INTRAVENOUS

## 2021-11-09 MED ORDER — PROPOFOL 10 MG/ML IV BOLUS
INTRAVENOUS | Status: DC | PRN
  Administered 2021-11-09: 200 mg via INTRAVENOUS

## 2021-11-09 MED ORDER — CHLORHEXIDINE GLUCONATE 0.12 % MT SOLN: 15.0000 mL | Freq: Once | OROMUCOSAL | Status: AC

## 2021-11-09 MED ORDER — LIDOCAINE 2% (20 MG/ML) 5 ML SYRINGE
INTRAMUSCULAR | Status: DC | PRN
  Administered 2021-11-09: 60 mg via INTRAVENOUS

## 2021-11-09 MED ORDER — FENTANYL CITRATE (PF) 100 MCG/2ML IJ SOLN: 25.0000 ug | INTRAMUSCULAR | Status: DC | PRN

## 2021-11-09 MED ORDER — SUGAMMADEX SODIUM 200 MG/2ML IV SOLN
INTRAVENOUS | Status: DC | PRN
  Administered 2021-11-09: 200 mg via INTRAVENOUS

## 2021-11-09 MED ORDER — FENTANYL CITRATE (PF) 100 MCG/2ML IJ SOLN
INTRAMUSCULAR | Status: DC | PRN
  Administered 2021-11-09: 100 ug via INTRAVENOUS

## 2021-11-09 MED ORDER — CHLORHEXIDINE GLUCONATE 0.12 % MT SOLN
OROMUCOSAL | Status: AC
  Administered 2021-11-09: 07:00:00 15 mL via OROMUCOSAL
  Filled 2021-11-09: qty 15

## 2021-11-09 MED ORDER — ONDANSETRON HCL 4 MG/2ML IJ SOLN: 4.0000 mg | Freq: Once | INTRAMUSCULAR | Status: DC | PRN

## 2021-11-09 MED ORDER — LACTATED RINGERS IV SOLN: INTRAVENOUS | Status: DC

## 2021-11-09 MED ORDER — AMISULPRIDE (ANTIEMETIC) 5 MG/2ML IV SOLN: 10.0000 mg | Freq: Once | INTRAVENOUS | Status: DC | PRN

## 2021-11-09 MED ORDER — PHENYLEPHRINE HCL-NACL 20-0.9 MG/250ML-% IV SOLN
INTRAVENOUS | Status: DC | PRN
  Administered 2021-11-09: 45 ug/min via INTRAVENOUS

## 2021-11-09 MED ORDER — ROCURONIUM BROMIDE 10 MG/ML (PF) SYRINGE
PREFILLED_SYRINGE | INTRAVENOUS | Status: DC | PRN
  Administered 2021-11-09: 20 mg via INTRAVENOUS
  Administered 2021-11-09: 60 mg via INTRAVENOUS

## 2021-11-09 MED ORDER — DEXAMETHASONE SODIUM PHOSPHATE 10 MG/ML IJ SOLN
INTRAMUSCULAR | Status: DC | PRN
  Administered 2021-11-09: 10 mg via INTRAVENOUS

## 2021-11-09 MED ORDER — PHENYLEPHRINE 40 MCG/ML (10ML) SYRINGE FOR IV PUSH (FOR BLOOD PRESSURE SUPPORT)
PREFILLED_SYRINGE | INTRAVENOUS | Status: DC | PRN
  Administered 2021-11-09: 80 ug via INTRAVENOUS

## 2021-11-09 MED ORDER — FENTANYL CITRATE (PF) 100 MCG/2ML IJ SOLN
INTRAMUSCULAR | Status: AC
  Filled 2021-11-09: qty 2

## 2021-11-09 NOTE — Anesthesia Preprocedure Evaluation (Signed)
Anesthesia Evaluation  Patient identified by MRN, date of birth, ID band Patient awake    Reviewed: Allergy & Precautions, NPO status , Patient's Chart, lab work & pertinent test results  Airway Mallampati: III  TM Distance: >3 FB Neck ROM: Full    Dental  (+) Chipped   Pulmonary former smoker,    Pulmonary exam normal breath sounds clear to auscultation       Cardiovascular negative cardio ROS Normal cardiovascular exam Rhythm:Regular Rate:Normal  ECG: SR, rate 97   Neuro/Psych negative neurological ROS  negative psych ROS   GI/Hepatic negative GI ROS, Neg liver ROS,   Endo/Other  diabetes, Oral Hypoglycemic Agents  Renal/GU negative Renal ROS     Musculoskeletal negative musculoskeletal ROS (+)   Abdominal   Peds  Hematology  (+) anemia , HLD   Anesthesia Other Findings lung mass, adenopathy  Reproductive/Obstetrics                             Anesthesia Physical Anesthesia Plan  ASA: 3  Anesthesia Plan: General   Post-op Pain Management:    Induction: Intravenous  PONV Risk Score and Plan: 2 and Ondansetron, Dexamethasone and Treatment may vary due to age or medical condition  Airway Management Planned: Oral ETT  Additional Equipment:   Intra-op Plan:   Post-operative Plan: Extubation in OR  Informed Consent: I have reviewed the patients History and Physical, chart, labs and discussed the procedure including the risks, benefits and alternatives for the proposed anesthesia with the patient or authorized representative who has indicated his/her understanding and acceptance.     Dental advisory given  Plan Discussed with: CRNA  Anesthesia Plan Comments:         Anesthesia Quick Evaluation

## 2021-11-09 NOTE — Discharge Instructions (Signed)
Flexible Bronchoscopy, Care After This sheet gives you information about how to care for yourself after your test. Your doctor may also give you more specific instructions. If you have problems or questions, contact your doctor. Follow these instructions at home: Eating and drinking Do not eat or drink anything (not even water) for 2 hours after your test, or until your numbing medicine (local anesthetic) wears off. When your numbness is gone and your cough and gag reflexes have come back, you may: Eat only soft foods. Slowly drink liquids. The day after the test, go back to your normal diet. Driving Do not drive for 24 hours if you were given a medicine to help you relax (sedative). Do not drive or use heavy machinery while taking prescription pain medicine. General instructions  Take over-the-counter and prescription medicines only as told by your doctor. Return to your normal activities as told. Ask what activities are safe for you. Do not use any products that have nicotine or tobacco in them. This includes cigarettes and e-cigarettes. If you need help quitting, ask your doctor. Keep all follow-up visits as told by your doctor. This is important. It is very important if you had a tissue sample (biopsy) taken. Get help right away if: You have shortness of breath that gets worse. You get light-headed. You feel like you are going to pass out (faint). You have chest pain. You cough up: More than a little blood. More blood than before. Summary Do not eat or drink anything (not even water) for 2 hours after your test, or until your numbing medicine wears off. Do not use cigarettes. Do not use e-cigarettes. Get help right away if you have chest pain.  Please call our office for any questions or concerns.  [phone].  This information is not intended to replace advice given to you by your health care provider. Make sure you discuss any questions you have with your health care  provider. Document Released: 09/26/2009 Document Revised: 11/11/2017 Document Reviewed: 12/17/2016 Elsevier Patient Education  2020 [employer].

## 2021-11-09 NOTE — Anesthesia Procedure Notes (Signed)
Procedure Name: Intubation Date/Time: 11/09/2021 9:35 AM Performed by: Griffin Dakin, CRNA Pre-anesthesia Checklist: Patient identified, Emergency Drugs available, Suction available and Patient being monitored Patient Re-evaluated:Patient Re-evaluated prior to induction Oxygen Delivery Method: Circle system utilized Preoxygenation: Pre-oxygenation with 100% oxygen Induction Type: IV induction Ventilation: Mask ventilation without difficulty Laryngoscope Size: Glidescope and 4 Grade View: Grade I Tube type: Oral Tube size: 8.5 mm Number of attempts: 1 Airway Equipment and Method: Rigid stylet and Video-laryngoscopy Placement Confirmation: ETT inserted through vocal cords under direct vision, positive ETCO2 and breath sounds checked- equal and bilateral Secured at: 24 cm Tube secured with: Tape Dental Injury: Teeth and Oropharynx as per pre-operative assessment  Comments: X1 attempt MAC 4 grade IV view, grade I view achieved with Glidescope 4

## 2021-11-09 NOTE — Transfer of Care (Signed)
Immediate Anesthesia Transfer of Care Note  Patient: [name] [name]  Procedure(s) Performed: ROBOTIC ASSISTED NAVIGATIONAL BRONCHOSCOPY (Right) VIDEO BRONCHOSCOPY WITH ENDOBRONCHIAL ULTRASOUND (Bilateral) BRONCHIAL BIOPSIES BRONCHIAL BRUSHINGS VIDEO BRONCHOSCOPY WITH RADIAL ENDOBRONCHIAL ULTRASOUND BRONCHIAL NEEDLE ASPIRATION BIOPSIES  Patient Location: PACU  Anesthesia Type:General  Level of Consciousness: awake, alert  and oriented  Airway & Oxygen Therapy: Patient Spontanous Breathing  Post-op Assessment: Report given to RN and Post -op Vital signs reviewed and stable  Post vital signs: Reviewed and stable  Last Vitals:  Vitals Value Taken Time  BP 134/65 11/09/21 1209  Temp    Pulse 91 11/09/21 1210  Resp 17 11/09/21 1210  SpO2 96 % 11/09/21 1210  Vitals shown include unvalidated device data.  Last Pain:  Vitals:   11/09/21 0709  TempSrc:   PainSc: 0-No pain         Complications: No notable events documented.

## 2021-11-09 NOTE — Anesthesia Postprocedure Evaluation (Signed)
Anesthesia Post Note  Patient: Jakevion Arney  Procedure(s) Performed: ROBOTIC ASSISTED NAVIGATIONAL BRONCHOSCOPY (Right) VIDEO BRONCHOSCOPY WITH ENDOBRONCHIAL ULTRASOUND (Bilateral) BRONCHIAL BIOPSIES BRONCHIAL BRUSHINGS VIDEO BRONCHOSCOPY WITH RADIAL ENDOBRONCHIAL ULTRASOUND BRONCHIAL NEEDLE ASPIRATION BIOPSIES     Patient location during evaluation: PACU Anesthesia Type: General Level of consciousness: awake Pain management: pain level controlled Vital Signs Assessment: post-procedure vital signs reviewed and stable Respiratory status: spontaneous breathing, nonlabored ventilation, respiratory function stable and patient connected to nasal cannula oxygen Cardiovascular status: blood pressure returned to baseline and stable Postop Assessment: no apparent nausea or vomiting Anesthetic complications: no   No notable events documented.  Last Vitals:  Vitals:   11/09/21 1225 11/09/21 1235  BP: 118/64 120/64  Pulse: 94 90  Resp: (!) 24 16  Temp:  36.9 C  SpO2: 94% 94%    Last Pain:  Vitals:   11/09/21 1235  TempSrc:   PainSc: 0-No pain                 Tarnisha Kachmar P Lajuana Patchell

## 2021-11-09 NOTE — Interval H&P Note (Signed)
History and Physical Interval Note:  11/09/2021 7:42 AM  [name] [name]  has presented today for surgery, with the diagnosis of lung mass, adenopathy.  The various methods of treatment have been discussed with the patient and family. After consideration of risks, benefits and other options for treatment, the patient has consented to  Procedure(s) with comments: ROBOTIC ASSISTED NAVIGATIONAL BRONCHOSCOPY (Right) - ION VIDEO BRONCHOSCOPY WITH ENDOBRONCHIAL ULTRASOUND (Bilateral) as a surgical intervention.  The patient's history has been reviewed, patient examined, no change in status, stable for surgery.  I have reviewed the patient's chart and labs.  Questions were answered to the patient's satisfaction.     Collene Gobble

## 2021-11-09 NOTE — Op Note (Signed)
Video Bronchoscopy with Robotic Assisted Bronchoscopic Navigation and Endobronchial Ultrasound Procedure Note  Date of Operation: 11/09/2021   Pre-op Diagnosis: Right middle lobe mass, right hilar mass, mediastinal adenopathy  Post-op Diagnosis: Same  Surgeon: Baltazar Apo  Assistants: None  Anesthesia: General endotracheal anesthesia  Operation: Flexible video fiberoptic bronchoscopy with robotic assistance and biopsies.  Estimated Blood Loss: Minimal  Complications: None  Indications and History: [name] [name] is a 75 y.o. male with history of minimal tobacco use.  He was found to have right lower lobe mass as well as a right upper lobe perihilar mass, left hilar adenopathy.  Recommendation was made to achieve tissue diagnosis via robotic assisted navigational bronchoscopy with endobronchial ultrasound and biopsies. The risks, benefits, complications, treatment options and expected outcomes were discussed with the patient.  The possibilities of pneumothorax, pneumonia, reaction to medication, pulmonary aspiration, perforation of a viscus, bleeding, failure to diagnose a condition and creating a complication requiring transfusion or operation were discussed with the patient who freely signed the consent.    Description of Procedure: The patient was seen in the Preoperative Area, was examined and was deemed appropriate to proceed.  The patient was taken to [facility] endoscopy room 3, identified as [name] and the procedure verified as Flexible Video Fiberoptic Bronchoscopy.  A Time Out was held and the above information confirmed.   Prior to the date of the procedure a high-resolution CT scan of the chest was performed. Utilizing ION software program a virtual tracheobronchial tree was generated to allow the creation of distinct navigation pathways to the patient's parenchymal abnormalities. After being taken to the operating room general anesthesia was initiated and the patient  was orally  intubated. The video fiberoptic bronchoscope was introduced via the endotracheal tube and a general inspection was performed which showed normal left lung anatomy.the main carina was sharp.  The right middle lobe and right lower lobe airways were widely patent and appeared normal.  The right upper lobe segmental airways were splayed, hypervascular and irregular.  Endobronchial biopsies, brushings, Wang needle aspiration biopsies were performed in the right upper lobe bronchus.  Aspiration of the bilateral mainstems was completed to remove any remaining secretions. Robotic catheter inserted into patient's endotracheal tube.   Target #1 right middle lobe mass: The distinct navigation pathways prepared prior to this procedure were then utilized to navigate to patient's lesion identified on CT scan. The robotic catheter was secured into place and the vision probe was withdrawn.  Lesion location was approximated using fluoroscopy and radial endobronchial ultrasound for peripheral targeting.  Local registration and targeting was performed using [employer] three-dimensional software.  Under fluoroscopic guidance transbronchial needle brushings, transbronchial needle biopsies, and transbronchial forceps biopsies were performed to be sent for cytology and pathology.   The standard scope was then withdrawn and the endobronchial ultrasound was used to identify and characterize the peritracheal, hilar and bronchial lymph nodes. Inspection showed an irregular vascular mass lesion at the right upper lobe bronchus in the typical locations of station 10 R. Using real-time ultrasound guidance Wang needle biopsies were take from the right upper lobe mass at Station 10 R and were sent for cytology.  Mass lesion was identified in the apical segment of the right upper lobe but accessing the apical segment with the EBUS scope and needle was difficult due to lack of flexibility.  At the end of the procedure a general airway inspection  was performed and there was no evidence of active bleeding. The bronchoscope was removed.  The patient tolerated the procedure well. There was no significant blood loss and there were no obvious complications. A post-procedural chest x-ray is pending.  Samples navigation target #1: 1. Transbronchial needle brushings from right middle lobe mass 2. Transbronchial Wang needle biopsies from right middle lobe mass 3. Transbronchial forceps biopsies from right middle lobe mass  Endobronchial samples: 1.  Endobronchial brushings from right upper lobe airway 2.  Wang needle biopsies from right upper lobe airway 3.  Endobronchial orceps biopsies from right upper lobe airway  EBUS samples: 1. Wang needle biopsies from right upper lobe mass (10 R)   Plans:  The patient will be discharged from the PACU to home when recovered from anesthesia and after chest x-ray is reviewed. We will review the cytology, pathology and microbiology results with the patient when they become available. Outpatient followup will be with Dr Valeta Harms or Dr Lamonte Sakai.    Baltazar Apo, MD, PhD 11/09/2021, 12:04 PM [facility] [phone] or if no answer before 7:00PM call [phone] For any issues after 7:00PM please call eLink [phone]

## 2021-11-10 ENCOUNTER — Ambulatory Visit
Admission: RE | Admit: 2021-11-10 | Discharge: 2021-11-10 | Disposition: A | Discharge status: Home / Self Care | Payer: Medicare Other | Source: Ambulatory Visit | Attending: Radiation Oncology | Admitting: Radiation Oncology

## 2021-11-10 VITALS — BP 139/62 | HR 89 | Temp 96.6°F | Resp 18 | Ht 71.0 in | Wt 157.4 lb

## 2021-11-10 DIAGNOSIS — Z8042 Family history of malignant neoplasm of prostate: Secondary | ICD-10-CM | POA: Diagnosis not present

## 2021-11-10 DIAGNOSIS — Z7984 Long term (current) use of oral hypoglycemic drugs: Secondary | ICD-10-CM | POA: Diagnosis not present

## 2021-11-10 DIAGNOSIS — E785 Hyperlipidemia, unspecified: Secondary | ICD-10-CM | POA: Insufficient documentation

## 2021-11-10 DIAGNOSIS — E119 Type 2 diabetes mellitus without complications: Secondary | ICD-10-CM | POA: Diagnosis not present

## 2021-11-10 DIAGNOSIS — J189 Pneumonia, unspecified organism: Secondary | ICD-10-CM | POA: Diagnosis not present

## 2021-11-10 DIAGNOSIS — I509 Heart failure, unspecified: Secondary | ICD-10-CM | POA: Diagnosis not present

## 2021-11-10 DIAGNOSIS — R918 Other nonspecific abnormal finding of lung field: Secondary | ICD-10-CM

## 2021-11-10 DIAGNOSIS — Z85828 Personal history of other malignant neoplasm of skin: Secondary | ICD-10-CM | POA: Diagnosis not present

## 2021-11-10 DIAGNOSIS — J90 Pleural effusion, not elsewhere classified: Secondary | ICD-10-CM | POA: Insufficient documentation

## 2021-11-10 DIAGNOSIS — R590 Localized enlarged lymph nodes: Secondary | ICD-10-CM | POA: Diagnosis not present

## 2021-11-10 DIAGNOSIS — I2510 Atherosclerotic heart disease of native coronary artery without angina pectoris: Secondary | ICD-10-CM | POA: Insufficient documentation

## 2021-11-10 DIAGNOSIS — I480 Paroxysmal atrial fibrillation: Secondary | ICD-10-CM | POA: Diagnosis not present

## 2021-11-10 DIAGNOSIS — Z7982 Long term (current) use of aspirin: Secondary | ICD-10-CM | POA: Diagnosis not present

## 2021-11-10 DIAGNOSIS — I3481 Nonrheumatic mitral (valve) annulus calcification: Secondary | ICD-10-CM | POA: Insufficient documentation

## 2021-11-10 NOTE — Progress Notes (Signed)
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 Johns, PA-C    Tyler Pita, MD  [facility] Direct Dial: [phone]  Fax: [phone] [email or web address]  Skype  LinkedIn   This document serves as a record of services personally performed by Tyler Pita, MD and Freeman Caldron, PA-C. It was created on their behalf by Wilburn Mylar, a trained medical scribe. The creation of this record is based on the scribe's personal observations and the provider's statements to them. This document has been checked and approved by the attending provider.

## 2021-11-10 NOTE — Progress Notes (Signed)
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  Yes, alcohol 7 glasses of wine per week.  Past/Anticipated interventions by cardiothoracic surgery, if any:    11/09/2021   Dr. Malvin Johns   ROBOTIC ASSISTED NAVIGATIONAL BRONCHOSCOPY (Right) - ION VIDEO BRONCHOSCOPY WITH ENDOBRONCHIAL ULTRASOUND (Bilateral) as a surgical intervention.  Past/Anticipated interventions by medical oncology, if any:   10/30/2021 Dr. [name]  Complete the staging work-up by ordering a PET scan as well as MRI of the brain.   Also refer the patient to pulmonary medicine urgently for consideration of bronchoscopy and biopsy of the right middle lobe lung mass and right hilar adenopathy.  Once the biopsy is available, we will send it for molecular study to rule out any actionable mutation in this patient with remote history of smoking.  I will see the patient back for follow-up visit in around 2 weeks for evaluation and more discussion of his treatment options based on the final pathology and staging work-up.   Signs/Symptoms Weight changes, if any:  Lost amount 6 lbs over six months period. Respiratory complaints, if any:  Yes Hemoptysis, if any:  Yes Pain issues, if any:  0/10  SAFETY ISSUES: Prior radiation?  No Pacemaker/ICD?   No Possible current pregnancy?  Male Is the patient on methotrexate?  No  Current Complaints / other details:  Learn more information about treatment.

## 2021-11-11 ENCOUNTER — Ambulatory Visit
Admission: RE | Admit: 2021-11-11 | Discharge: 2021-11-11 | Disposition: A | Discharge status: Home / Self Care | Payer: Medicare Other | Source: Ambulatory Visit | Attending: Radiation Oncology | Admitting: Radiation Oncology

## 2021-11-11 ENCOUNTER — Encounter: Payer: Self-pay | Admitting: *Deleted

## 2021-11-11 ENCOUNTER — Other Ambulatory Visit: Payer: Self-pay

## 2021-11-11 ENCOUNTER — Encounter (HOSPITAL_COMMUNITY): Payer: Self-pay | Admitting: Emergency Medicine

## 2021-11-11 DIAGNOSIS — C342 Malignant neoplasm of middle lobe, bronchus or lung: Secondary | ICD-10-CM | POA: Insufficient documentation

## 2021-11-11 DIAGNOSIS — C3491 Malignant neoplasm of unspecified part of right bronchus or lung: Secondary | ICD-10-CM

## 2021-11-11 NOTE — Progress Notes (Signed)
[facility] CSW Psychosocial Distress Screening   Social Work was referred by distress screening protocol.  The patient scored a 7 on the Psychosocial Distress Thermometer which indicates moderate distress. Social Work Theatre manager contacted patient by phone to assess for distress and other psychosocial needs.   [facility] DISTRESS SCREENING 11/10/2021  Screening Type Initial Screening  Distress experienced in past week (1-10) 7  Emotional problem type Adjusting to illness  Spiritual/Religous concerns type Facing my mortality  Information Concerns Type Lack of info about treatment;Lack of info about complementary therapy choices;Lack of info about maintaining fitness  Physical Problem type Sleep/insomnia;Getting around;Breathing;Mouth sores/swallowing;Loss of appetitie    Patient reports he is feeling a whole lot better since his appointment yesterday, that he is breathing easier and is ready to start radiation next week. Patient denies any resource/ financial needs at this time.   Patient and Intern discussed common feelings and emotions when being diagnosed with cancer, and the importance of support during treatment; Informed patient of the support team roles and support services at [facility]; Provided CSW contact information and encouraged patient to call with any questions or concerns.  Rosary Lively, Social Work Intern Supervised by Gwinda Maine, LCSW

## 2021-11-12 ENCOUNTER — Encounter: Payer: Self-pay | Admitting: Internal Medicine

## 2021-11-12 ENCOUNTER — Inpatient Hospital Stay: Payer: Medicare Other | Attending: Internal Medicine | Admitting: Internal Medicine

## 2021-11-12 DIAGNOSIS — J90 Pleural effusion, not elsewhere classified: Secondary | ICD-10-CM | POA: Insufficient documentation

## 2021-11-12 DIAGNOSIS — C439 Malignant melanoma of skin, unspecified: Secondary | ICD-10-CM | POA: Diagnosis not present

## 2021-11-12 DIAGNOSIS — R590 Localized enlarged lymph nodes: Secondary | ICD-10-CM | POA: Diagnosis not present

## 2021-11-12 DIAGNOSIS — C3491 Malignant neoplasm of unspecified part of right bronchus or lung: Secondary | ICD-10-CM | POA: Diagnosis not present

## 2021-11-12 DIAGNOSIS — C7801 Secondary malignant neoplasm of right lung: Secondary | ICD-10-CM | POA: Insufficient documentation
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
## 2021-11-12 NOTE — Progress Notes (Signed)
  Radiation Oncology         [phone] ________________________________  Name: [name] [name] MRN: [id number]  Date: 11/11/2021  DOB: 10-31-1946  SIMULATION AND TREATMENT PLANNING NOTE    ICD-10-CM   1. 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  NARRATIVE:  The patient was brought to the [facility].  Identity was confirmed.  All relevant records and images related to the planned course of therapy were reviewed.  The patient freely provided informed written consent to proceed with treatment after reviewing the details related to the planned course of therapy. The consent form was witnessed and verified by the simulation staff.  Then, the patient was set-up in a stable reproducible  supine position for radiation therapy.  CT images were obtained.  Surface markings were placed.  The CT images were loaded into the planning software.  Then the target and avoidance structures were contoured.  Treatment planning then occurred.  The radiation prescription was entered and confirmed.  Then, I designed and supervised the construction of a total of 6 medically necessary complex treatment devices, including a BodyFix immobilization mold custom fitted to the patient along with 5 multileaf collimators conformally shaped radiation around the treatment target while shielding critical structures such as the heart and spinal cord maximally.  I have requested : 3D Simulation  I have requested a DVH of the following structures: Left lung, right lung, spinal cord, heart, esophagus, and target.  I have ordered:Nutrition Consult  SPECIAL TREATMENT PROCEDURE:  The planned course of therapy using radiation constitutes a special treatment procedure. Special care is required in the management of this patient for  the following reasons.  The patient will be receiving concurrent chemotherapy requiring careful monitoring for increased toxicities of treatment including periodic laboratory values.  The special nature of the planned course of radiotherapy will require increased physician supervision and oversight to ensure patient's safety with optimal treatment outcomes.  PLAN:  The patient will receive 66 Gy in 33 fractions.  ________________________________  Sheral Apley Tammi Klippel, M.D.

## 2021-11-16 ENCOUNTER — Ambulatory Visit (INDEPENDENT_AMBULATORY_CARE_PROVIDER_SITE_OTHER): Payer: Medicare Other

## 2021-11-16 ENCOUNTER — Ambulatory Visit: Payer: Medicare Other | Admitting: Acute Care

## 2021-11-16 ENCOUNTER — Encounter: Payer: Self-pay | Admitting: Acute Care

## 2021-11-16 ENCOUNTER — Other Ambulatory Visit: Payer: Self-pay | Admitting: Acute Care

## 2021-11-16 ENCOUNTER — Other Ambulatory Visit: Payer: Self-pay

## 2021-11-16 ENCOUNTER — Telehealth: Payer: Self-pay | Admitting: Acute Care

## 2021-11-16 VITALS — BP 140/64 | HR 85 | Temp 97.6°F | Ht 71.0 in | Wt 156.6 lb

## 2021-11-16 DIAGNOSIS — C3491 Malignant neoplasm of unspecified part of right bronchus or lung: Secondary | ICD-10-CM

## 2021-11-16 DIAGNOSIS — R918 Other nonspecific abnormal finding of lung field: Secondary | ICD-10-CM

## 2021-11-16 DIAGNOSIS — J189 Pneumonia, unspecified organism: Secondary | ICD-10-CM

## 2021-11-16 DIAGNOSIS — R590 Localized enlarged lymph nodes: Secondary | ICD-10-CM

## 2021-11-16 DIAGNOSIS — C342 Malignant neoplasm of middle lobe, bronchus or lung: Secondary | ICD-10-CM | POA: Diagnosis not present

## 2021-11-16 NOTE — Patient Instructions (Addendum)
It is good to see you today. Your cytology is not back yet today. We will call you with the results. [name] [name] with radiation treatment. Follow up with Dr. Earlie Server and Dr. Tammi Klippel as scheduled CXR today to re-evaluate the pneumonia for improvement ( Stat)  Follow up with Dr. Valeta Harms in 2 months. If pneumonia remains a concern, we will see you sooner.  Call for any blood in your sputum, seek emergency care if this is a large amount that does not resolve on its own.  Please contact office for sooner follow up if symptoms do not improve or worsen or seek emergency care

## 2021-11-16 NOTE — H&P (View-Only) (Signed)
History of Present Illness [name] [name] is a 75 y.o. male  former smoker, quit 50 years ago, worked for [employer], from [place], [place].  He presented with abnormal CT imaging found to have a large lung mass with associated adenopathy.  Patient had CT scan of the chest completed on 10/28/2021.  CT of the chest revealed a 6.4 x 4.2 right middle lobe mass concerning for primary malignancy with associated right hilar adenopathy and a very small pleural effusion.  Also has a large left hilar lymph node. He had a robotic assisted navigational bronchoscopy ( right) with video bronchoscopy with EBUS. By Dr. Lamonte Sakai in 11/09/2021. He is here for follow up after procedure. 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 Stage IV (T3, N3, M1 a) neoplasm pending final tissue diagnosis   11/16/2021 Pt. Presents for follow up. He had a  robotic assisted navigational bronchoscopy ( right) with video bronchoscopy with EBUS  on 11/09/2021 by Dr. Lamonte Sakai for suspected lung cancer. He states he is doing well after the procedure. He states he did cough up some scant blood secretions for a day or two after the procedure, but that has stopped completely. He does endorse a cough. He has seen Dr. Tammi Klippel, and starts radiation treatments on Wednesday to treat the 5.4 cm right hilar mass extending into the mediastinum, causing significant narrowing of multiple right upper lobe bronchi resulting in postobstructive atelectasis, with hope this will improve with treatment .He does endorse shortness of breath with exertion and fatigue. Both the patient and his wife are anxious to know the results of the cytology as they know this will determine further treatment options.   Test Results: CT Chest 10/28/2021 No evidence of pulmonary embolus.   6.4 x 4.2 cm right middle lobe mass concerning for primary lung cancer.   Large right  hilar/suprahilar mass/adenopathy with postobstructive airspace disease throughout the right upper lobe concerning for postobstructive pneumonia. Mass also occludes the superior branches of the right pulmonary artery.   Small right pleural effusion, stable since prior study. Left hilar adenopathy, stable.  PET Scan 11/04/2021 Constellation of findings compatible with bronchogenic neoplasm, multifocal and or metastatic involvement in the bilateral chest.   RIGHT hilar, paramediastinal upper lobe mass inseparable from mediastinal fat and RIGHT hilum with marked hypermetabolic features.   RIGHT middle lobe mass tracking along the fissure, associated with some nodularity inferiorly that extends across the fissure into the RIGHT lower lobe also hypermetabolic.   Hypermetabolic juxta hilar/mediastinal node in the LEFT chest anterior to the aorta is compatible with contralateral nodal disease.   Pleural effusion suspicious raises the question of for malignant effusion but without discrete nodularity.   Areas of airspace disease in the peripheral RIGHT upper lobe may reflect postobstructive pneumonitis and or areas of pulmonary infarct given that the dominant area about the RIGHT hilum occludes the RIGHT upper lobe arterial branches.   Small RIGHT axillary lymph node favored to be reactive or even related to recent vaccination, attention on follow-up.   No signs of disease in the abdomen or pelvis. Specifically, the adrenal glands are normal.    MRI Brain 11/03/2021  Negative for any acute intracranial process or metastatic disease  All Cytology is pending as of 11/16/2021.  Samples navigation target #1: 1. Transbronchial needle brushings from right middle lobe mass 2. Transbronchial Wang needle biopsies from right middle lobe mass 3. Transbronchial forceps biopsies from  right middle lobe mass   Endobronchial samples: 1.  Endobronchial brushings from right upper lobe airway 2.   Wang needle biopsies from right upper lobe airway 3.  Endobronchial orceps biopsies from right upper lobe airway   CBC Latest Ref Rng & Units 11/09/2021 10/30/2021 10/28/2021  WBC 4.0 - 10.5 K/uL 5.6 9.4 6.4  Hemoglobin 13.0 - 17.0 g/dL 9.7(L) 10.5(L) 11.0(L)  Hematocrit 39.0 - 52.0 % 28.6(L) 31.5(L) 32.4(L)  Platelets 150 - 400 K/uL 282 349 374    BMP Latest Ref Rng & Units 11/09/2021 10/30/2021 10/28/2021  Glucose 70 - 99 mg/dL 213(H) 239(H) 182(H)  BUN 8 - 23 mg/dL '17 14 13  ' Creatinine 0.61 - 1.24 mg/dL 0.86 1.06 0.81  BUN/Creat Ratio 10 - 24 - - -  Sodium 135 - 145 mmol/L 130(L) 131(L) 127(L)  Potassium 3.5 - 5.1 mmol/L 4.0 4.5 4.4  Chloride 98 - 111 mmol/L 96(L) 95(L) 91(L)  CO2 22 - 32 mmol/L 25 21(L) 23  Calcium 8.9 - 10.3 mg/dL 9.0 9.6 9.2    BNP    Component Value Date/Time   BNP 67.1 10/28/2021 1407    ProBNP No results found for: PROBNP  PFT No results found for: FEV1PRE, FEV1POST, FVCPRE, FVCPOST, TLC, DLCOUNC, PREFEV1FVCRT, PSTFEV1FVCRT  DG Chest 2 View  Result Date: 10/28/2021 CLINICAL DATA:  Lung cancer EXAM: CHEST - 2 VIEW COMPARISON:  10/23/2021.  CT chest 10/22/2021 FINDINGS: Right perihilar mass and right middle lobe mass as noted on prior CT. Right upper lobe infiltrate is present which may represent postobstructive pneumonia. This is unchanged. Improvement in small right pleural effusion. Left lung is clear.  Negative for heart failure IMPRESSION: Right hilar and right middle lobe mass lesions unchanged. Right upper lobe infiltrate unchanged likely pneumonia. Improvement in right pleural effusion. Electronically Signed   By: Franchot Gallo M.D.   On: 10/28/2021 14:07   DG Chest 2 View  Result Date: 10/23/2021 CLINICAL DATA:  cough , recent dx lung cancer EXAM: CHEST - 2 VIEW COMPARISON:  CT chest 10/22/2021 FINDINGS: The heart and mediastinal contours are otherwise within normal limits. Aortic calcification. A proximally 5 cm right perihilar mass  consistent with known lung cancer. Similar finding within the right middle lobe measuring approximately 7 cm. Patchy right upper lobe airspace opacity. No pulmonary edema. Trace right pleural effusion. No left pleural effusion. No pneumothorax. No acute osseous abnormality. IMPRESSION: 1. Right upper lobe infection/inflammation, possibly postobstructive in the setting of known right perihilar and right middle lobe malignant masses. 2. Associated trace right pleural effusion. Electronically Signed   By: Iven Finn M.D.   On: 10/23/2021 19:46   CT CHEST W CONTRAST  Result Date: 10/22/2021 CLINICAL DATA:  Cough. EXAM: CT CHEST WITH CONTRAST TECHNIQUE: Multidetector CT imaging of the chest was performed during intravenous contrast administration. CONTRAST:  81m OMNIPAQUE IOHEXOL 350 MG/ML SOLN COMPARISON:  May 04, 2017. FINDINGS: Cardiovascular: Atherosclerosis of thoracic aorta is noted without aneurysm or dissection. Normal cardiac size. No pericardial effusion. Mild coronary artery calcifications are noted. Mediastinum/Nodes: Thyroid gland is unremarkable. The esophagus is unremarkable. 2.4 cm left hilar lymph node is noted consistent with metastatic disease. Right hilar mass measuring 5.4 x 5.1 cm is noted concerning for metastatic disease resulting in some degree of postobstructive atelectasis of the right upper lobe. Lungs/Pleura: No pneumothorax is noted. Left lung is clear. Multiple airspace opacities are noted in right upper lobe peripherally concerning for postobstructive atelectasis or pneumonia. As noted previously, large right hilar mass  is noted which may represent primary pulmonary malignancy; this appears to be causing significant narrowing of multiple right upper lobe bronchi. 12 x 9 mm nodule is noted anteriorly in right lower lobe concerning for pulmonary metastatic disease. Small right pleural effusion is noted. 6.1 x 3.9 cm rounded mass is noted in right middle lobe concerning for  malignancy. Upper Abdomen: No acute abnormality. Musculoskeletal: No chest wall abnormality. No acute or significant osseous findings. IMPRESSION: 6.1 x 3.9 cm rounded mass is noted in right middle lobe concerning for primary pulmonary malignancy. Large right hilar mass measuring at least 5.4 x 5.1 cm is noted which is concerning for metastatic disease which is extended into the mediastinum. It appears to be causing significant narrowing of multiple right upper lobe bronchi, which results in postobstructive atelectasis or multifocal pneumonia of the right upper lobe. 2.4 cm left hilar lymph node is noted concerning for metastatic disease. 12 x 9 mm nodule is noted anteriorly in right upper lobe concerning for metastatic disease. These results will be called to the ordering clinician or representative by the Radiologist Assistant, and communication documented in the PACS or zVision Dashboard. Mild coronary artery calcifications are noted. Small right pleural effusion is noted. Aortic Atherosclerosis (ICD10-I70.0). Electronically Signed   By: Marijo Conception M.D.   On: 10/22/2021 16:45   CT Angio Chest PE W/Cm &/Or Wo Cm  Result Date: 10/28/2021 CLINICAL DATA:  Shortness of breath. EXAM: CT ANGIOGRAPHY CHEST WITH CONTRAST TECHNIQUE: Multidetector CT imaging of the chest was performed using the standard protocol during bolus administration of intravenous contrast. Multiplanar CT image reconstructions and MIPs were obtained to evaluate the vascular anatomy. CONTRAST:  24m OMNIPAQUE IOHEXOL 350 MG/ML SOLN COMPARISON:  10/22/2021 FINDINGS: Cardiovascular: No evidence of pulmonary embolus. There is occlusion of the superior branches of the right upper lobe from the large right hilar mass. Heart is normal size. Aorta is normal caliber. Coronary artery and aortic calcifications. Mediastinum/Nodes: Left infrahilar mass measures 2.4 cm, stable since prior study. Right suprahilar mass difficult to measure due to adjacent  postobstructive airways disease, but approximately 6.3 x 5.5 cm. Show Lungs/Pleura: Imaging into the upper abdomen demonstrates no acute findings. As described above, right hilar/suprahilar mass again noted with postobstructive airspace disease throughout the right upper lobe concerning for postobstructive pneumonia. Small right pleural effusion, similar to prior study. Upper Abdomen: Imaging into the upper abdomen demonstrates no acute findings. Musculoskeletal: Chest wall soft tissues are unremarkable. No acute bony abnormality. Review of the MIP images confirms the above findings. IMPRESSION: No evidence of pulmonary embolus. 6.4 x 4.2 cm right middle lobe mass concerning for primary lung cancer. Large right hilar/suprahilar mass/adenopathy with postobstructive airspace disease throughout the right upper lobe concerning for postobstructive pneumonia. Mass also occludes the superior branches of the right pulmonary artery. Small right pleural effusion, stable since prior study. Left hilar adenopathy, stable. Coronary artery disease. Aortic Atherosclerosis (ICD10-I70.0). Electronically Signed   By: KRolm BaptiseM.D.   On: 10/28/2021 21:09   MR Brain W Wo Contrast  Result Date: 11/04/2021 CLINICAL DATA:  Non-small cell lung cancer, staging. EXAM: MRI HEAD WITHOUT AND WITH CONTRAST TECHNIQUE: Multiplanar, multiecho pulse sequences of the brain and surrounding structures were obtained without and with intravenous contrast. CONTRAST:  7.540mGADAVIST GADOBUTROL 1 MMOL/ML IV SOLN COMPARISON:  None. FINDINGS: Brain: No restricted diffusion to suggest acute or subacute infarct. No hemorrhage, mass, mass effect, or midline shift. No abnormal enhancement. No foci of hemosiderin deposition to suggest remote  hemorrhage. No extra-axial collection or hydrocephalus. T2 hyperintense signal in the periventricular white matter, likely the sequela of chronic small vessel ischemic disease. Vascular: Normal flow voids. Skull and  upper cervical spine: Normal marrow signal. Sinuses/Orbits: Negative.  Status post bilateral lens replacements. Other: Trace fluid in right mastoid air cells. IMPRESSION: No acute intracranial process. No evidence of metastatic disease in the brain. Electronically Signed   By: Merilyn Baba M.D.   On: 11/04/2021 22:35   NM PET Image Initial (PI) Skull Base To Thigh (F-18 FDG)  Result Date: 11/05/2021 CLINICAL DATA:  Initial treatment strategy for non-small cell lung cancer in a 75 year old male. EXAM: NUCLEAR MEDICINE PET SKULL BASE TO THIGH TECHNIQUE: 7.49 mCi F-18 FDG was injected intravenously. Full-ring PET imaging was performed from the skull base to thigh after the radiotracer. CT data was obtained and used for attenuation correction and anatomic localization. Fasting blood glucose: 132 mg/dl COMPARISON:  CT of the chest of October 28, 2021 October 22, 2021. FINDINGS: Mediastinal blood pool activity: SUV max 2.60 Liver activity: SUV max NA NECK: No hypermetabolic lymph nodes in the neck. Incidental CT findings: none CHEST: RIGHT paramediastinal and juxta hilar mass measuring at a minimum 7.2 x 5.4 cm (image 82/4) maximum SUV of 14.7. This is inseparable from RIGHT mediastinal fat and RIGHT hilum as on recent CT imaging. Soft tissue tracking along the major fissure in the RIGHT middle lobe is ovoid, nodular and hypermetabolic measuring 5.9 x 4.6 cm with a maximum SUV 28. LEFT mediastinal lymph node with enlargement (image 93/4) this measures approximately 1.8 cm short axis with a maximum SUV of 20.8. RIGHT sided pleural effusion with mildly increased metabolic activity with a maximum SUV of 2.0. Multifocal areas of ground-glass at the periphery of the RIGHT upper lobe more organized but with similar size and distribution when compared to the CT of October 28, 2021 show mild increased metabolic activity. The LEFT lung is clear. Incidental CT findings: Aortic atherosclerosis without aneurysmal dilation.  Mitral annular calcification. Three-vessel coronary artery disease. Small lymph node in the RIGHT axillary region with a maximum SUV of 2.4 shows a fatty hilum and measures 9 mm (image 65/4) ABDOMEN/PELVIS: No abnormal hypermetabolic activity within the liver, pancreas, adrenal glands, or spleen. No hypermetabolic lymph nodes in the abdomen or pelvis. Incidental CT findings: No acute findings relative to liver, gallbladder, pancreas, spleen, adrenal glands, kidneys, stomach, small or large bowel. RIGHT inguinal hernia with incipient herniation of small bowel loops, no sign of obstruction. Appendix is normal. Colonic diverticulosis. Aortic atherosclerosis without aneurysm. SKELETON: No focal hypermetabolic activity to suggest skeletal metastasis. Incidental CT findings: none IMPRESSION: Constellation of findings compatible with bronchogenic neoplasm, multifocal and or metastatic involvement in the bilateral chest. RIGHT hilar, paramediastinal upper lobe mass inseparable from mediastinal fat and RIGHT hilum with marked hypermetabolic features. RIGHT middle lobe mass tracking along the fissure, associated with some nodularity inferiorly that extends across the fissure into the RIGHT lower lobe also hypermetabolic. Hypermetabolic juxta hilar/mediastinal node in the LEFT chest anterior to the aorta is compatible with contralateral nodal disease. Pleural effusion suspicious raises the question of for malignant effusion but without discrete nodularity. Areas of airspace disease in the peripheral RIGHT upper lobe may reflect postobstructive pneumonitis and or areas of pulmonary infarct given that the dominant area about the RIGHT hilum occludes the RIGHT upper lobe arterial branches. Small RIGHT axillary lymph node favored to be reactive or even related to recent vaccination, attention on follow-up. No signs of  disease in the abdomen or pelvis. Specifically, the adrenal glands are normal. Electronically Signed   By:  Zetta Bills M.D.   On: 11/05/2021 13:03   DG Chest Port 1 View  Result Date: 11/09/2021 CLINICAL DATA:  S/P bronchoscopy with biopsy EXAM: PORTABLE CHEST - 1 VIEW COMPARISON:  10/28/2021 FINDINGS: No pneumothorax. Right hilar mass and right middle lobe mass persist. Worsening peripheral airspace consolidation laterally in the right upper lobe. Some increase in interstitial opacities throughout the remainder of the right upper lobe. Left lung clear. Heart size within normal limits. Aortic Atherosclerosis (ICD10-170.0). No effusion. Visualized bones unremarkable. IMPRESSION: 1. No pneumothorax post bronchoscopic biopsy 2. Stable right hilar and middle lobe masses. 3. Right lung interstitial and airspace opacities increased since previous. Electronically Signed   By: Lucrezia Europe M.D.   On: 11/09/2021 12:29   DG C-Arm 1-60 Min-No Report  Result Date: 11/09/2021 Fluoroscopy was utilized by the requesting physician.  No radiographic interpretation.   DG C-ARM BRONCHOSCOPY  Result Date: 11/09/2021 C-ARM BRONCHOSCOPY: Fluoroscopy was utilized by the requesting physician.  No radiographic interpretation.     Past medical hx Past Medical History:  Diagnosis Date   Anemia    CHF (congestive heart failure) (HCC)    Complication of anesthesia    slow to awaken  x1   Dyspnea    Family hx of prostate cancer    IN BROTHER   History of echocardiogram    Echo 5/18: EF 70-17, normal diastolic function, PASP 32   History of nuclear stress test    Myoview 5/18: EF 60, inf defect suspicious for artifact, no ischemia, Low Risk   Hx of basal cell carcinoma    FOLLOWED BY DERMATOLOGIST DR. GOODRICH   Hyperlipidemia    PAF (paroxysmal atrial fibrillation) ([place])    DIAGNOSED 5/18   Pneumonia    Type 2 diabetes mellitus ([facility])      Social History   Tobacco Use   Smoking status: Former    Years: 8.00    Types: Cigarettes    Quit date: 04/28/1970    Years since quitting: 51.5   Smokeless tobacco:  Never  Vaping Use   Vaping Use: Never used  Substance Use Topics   Alcohol use: Yes    Alcohol/week: 17.0 standard drinks    Types: 14 Glasses of wine, 3 Shots of liquor per week    Comment: shots - 3.5 - drinks 1/2 shot 7 daysa week.   Drug use: No    Mr.Schweigert reports that he quit smoking about 51 years ago. His smoking use included cigarettes. He has never used smokeless tobacco. He reports current alcohol use of about 17.0 standard drinks per week. He reports that he does not use drugs.  Tobacco Cessation: Pt. Is a former smoker . Quit 1971   Past surgical hx, Family hx, Social hx all reviewed.  Current Outpatient Medications on File Prior to Visit  Medication Sig   ACCU-CHEK SOFTCLIX LANCETS lancets by Other route. Use as instructed   acetaminophen (TYLENOL) 500 MG tablet Take 1,000 mg by mouth every 6 (six) hours as needed for moderate pain.   aspirin EC 81 MG tablet Take 1 tablet (81 mg total) by mouth daily. (Patient taking differently: Take 81 mg by mouth every evening.)   aspirin-sod bicarb-citric acid (ALKA-SELTZER) 325 MG TBEF tablet Take 650 mg by mouth every 6 (six) hours as needed (indigestion).   Blood Glucose Monitoring Suppl (ACCU-CHEK AVIVA PLUS) w/Device KIT by Does not  apply route.   Carboxymethylcellul-Glycerin (LUBRICATING EYE DROPS OP) Place 1 drop into both eyes daily as needed (dry eyes).   dextromethorphan (DELSYM) 30 MG/5ML liquid Take 30 mg by mouth as needed for cough.   dextromethorphan-guaiFENesin (MUCINEX DM) 30-600 MG 12hr tablet Take 1 tablet by mouth 2 (two) times daily as needed (congestion).   DM-APAP-CPM (CORICIDIN HBP PO) Take 1 tablet by mouth daily as needed (congestion).   glimepiride (AMARYL) 4 MG tablet Take 2 mg by mouth daily.   glucose blood test strip 1 each by Other route as needed for other. Use as instructed   LORazepam (ATIVAN) 0.5 MG tablet One tab po 30 min before MRI. Repeat x 1 if needed   metFORMIN (GLUCOPHAGE) 500 MG tablet  Take 1,000 mg by mouth 2 (two) times daily.   Multiple Vitamin (MULTIVITAMIN) tablet Take 1 tablet by mouth daily.   Multiple Vitamins-Minerals (ZINC PO) Take 1 tablet by mouth daily.   simvastatin (ZOCOR) 40 MG tablet Take 40 mg by mouth daily at 6 PM.   Current Facility-Administered Medications on File Prior to Visit  Medication   regadenoson (LEXISCAN) injection SOLN 0.4 mg     No Known Allergies  Review Of Systems:  Constitutional:   No  weight loss, night sweats,  Fevers, chills, + fatigue, or  lassitude.  HEENT:   No headaches,  Difficulty swallowing,  Tooth/dental problems, or  Sore throat,                No sneezing, itching, ear ache, nasal congestion, post nasal drip,   CV:  No chest pain,  Orthopnea, PND, swelling in lower extremities, anasarca, dizziness, palpitations, syncope.   GI  No heartburn, indigestion, abdominal pain, nausea, vomiting, diarrhea, change in bowel habits, loss of appetite, bloody stools.   Resp: + shortness of breath with exertion or at rest.  No excess mucus, no productive cough,  No non-productive cough,  No coughing up of blood.  No change in color of mucus.  No wheezing.  No chest wall deformity  Skin: no rash or lesions.  GU: no dysuria, change in color of urine, no urgency or frequency.  No flank pain, no hematuria   MS:  No joint pain or swelling.  No decreased range of motion.  No back pain.  Psych:  No change in mood or affect. No depression or anxiety.  No memory loss.   Vital Signs BP 140/64 (BP Location: Left Arm, Patient Position: Sitting, Cuff Size: Normal)   Pulse 85   Temp 97.6 F (36.4 C) (Oral)   Ht '5\' 11"'  (1.803 m)   Wt 156 lb 9.6 oz (71 kg)   SpO2 99%   BMI 21.84 kg/m    Physical Exam:  General- No distress,  A&Ox3, pleasant ENT: No sinus tenderness, TM clear, pale nasal mucosa, no oral exudate,no post nasal drip, no LAN Cardiac: S1, S2, regular rate and rhythm, no murmur Chest: + Shortness of breath, No wheeze/  rales/ dullness; no accessory muscle use, no nasal flaring, no sternal retractions, breath sounds are diminished throughout.  Abd.: Soft Non-tender, ND, BS +, Body mass index is 21.84 kg/m.  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 Stage IV (T3, N3, M1  a) neoplasm pending final tissue diagnosis Plan Your cytology is not back yet today. We will call you with the results. Emmit Alexanders with radiation treatment. Follow up with Dr. Earlie Server and Dr. Tammi Klippel as scheduled CXR today to re-evaluate the pneumonia for improvement ( Stat)  Follow up with Dr. Valeta Harms in 2 months. If pneumonia remains a concern, we will see you sooner.  Call for any blood in your sputum, seek emergency care if this is a large amount that does not resolve on its own.  Please contact office for sooner follow up if symptoms do not improve or worsen or seek emergency care    Slow to resolve Pneumonia  Previously treated with antimicrobials Plan CXR today to assess for resolution  We will call you with the results Follow up as noted above  I spent 30 minutes dedicated to the care of this patient on the date of this encounter to include pre-visit review of records, face-to-face time with the patient discussing conditions above, post visit ordering of testing, clinical documentation with the electronic health record, making appropriate referrals as documented, and communicating necessary information to the patient's healthcare team.   Magdalen Spatz, NP 11/16/2021  9:30 AM

## 2021-11-16 NOTE — Progress Notes (Signed)
History of Present Illness [name] [name] is a 75 y.o. male  former smoker, quit 50 years ago, worked for [employer], from [place], [place].  He presented with abnormal CT imaging found to have a large lung mass with associated adenopathy.  Patient had CT scan of the chest completed on 10/28/2021.  CT of the chest revealed a 6.4 x 4.2 right middle lobe mass concerning for primary malignancy with associated right hilar adenopathy and a very small pleural effusion.  Also has a large left hilar lymph node. He had a robotic assisted navigational bronchoscopy ( right) with video bronchoscopy with EBUS. By Dr. Lamonte Sakai in 11/09/2021. He is here for follow up after procedure. 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 Stage IV (T3, N3, M1 a) neoplasm pending final tissue diagnosis   11/16/2021 Pt. Presents for follow up. He had a  robotic assisted navigational bronchoscopy ( right) with video bronchoscopy with EBUS  on 11/09/2021 by Dr. Lamonte Sakai for suspected lung cancer. He states he is doing well after the procedure. He states he did cough up some scant blood secretions for a day or two after the procedure, but that has stopped completely. He does endorse a cough. He has seen Dr. Tammi Klippel, and starts radiation treatments on Wednesday to treat the 5.4 cm right hilar mass extending into the mediastinum, causing significant narrowing of multiple right upper lobe bronchi resulting in postobstructive atelectasis, with hope this will improve with treatment .He does endorse shortness of breath with exertion and fatigue. Both the patient and his wife are anxious to know the results of the cytology as they know this will determine further treatment options.   Test Results: CT Chest 10/28/2021 No evidence of pulmonary embolus.   6.4 x 4.2 cm right middle lobe mass concerning for primary lung cancer.   Large right  hilar/suprahilar mass/adenopathy with postobstructive airspace disease throughout the right upper lobe concerning for postobstructive pneumonia. Mass also occludes the superior branches of the right pulmonary artery.   Small right pleural effusion, stable since prior study. Left hilar adenopathy, stable.  PET Scan 11/04/2021 Constellation of findings compatible with bronchogenic neoplasm, multifocal and or metastatic involvement in the bilateral chest.   RIGHT hilar, paramediastinal upper lobe mass inseparable from mediastinal fat and RIGHT hilum with marked hypermetabolic features.   RIGHT middle lobe mass tracking along the fissure, associated with some nodularity inferiorly that extends across the fissure into the RIGHT lower lobe also hypermetabolic.   Hypermetabolic juxta hilar/mediastinal node in the LEFT chest anterior to the aorta is compatible with contralateral nodal disease.   Pleural effusion suspicious raises the question of for malignant effusion but without discrete nodularity.   Areas of airspace disease in the peripheral RIGHT upper lobe may reflect postobstructive pneumonitis and or areas of pulmonary infarct given that the dominant area about the RIGHT hilum occludes the RIGHT upper lobe arterial branches.   Small RIGHT axillary lymph node favored to be reactive or even related to recent vaccination, attention on follow-up.   No signs of disease in the abdomen or pelvis. Specifically, the adrenal glands are normal.    MRI Brain 11/03/2021  Negative for any acute intracranial process or metastatic disease  All Cytology is pending as of 11/16/2021.  Samples navigation target #1: 1. Transbronchial needle brushings from right middle lobe mass 2. Transbronchial Wang needle biopsies from right middle lobe mass 3. Transbronchial forceps biopsies from  right middle lobe mass   Endobronchial samples: 1.  Endobronchial brushings from right upper lobe airway 2.   Wang needle biopsies from right upper lobe airway 3.  Endobronchial orceps biopsies from right upper lobe airway   CBC Latest Ref Rng & Units 11/09/2021 10/30/2021 10/28/2021  WBC 4.0 - 10.5 K/uL 5.6 9.4 6.4  Hemoglobin 13.0 - 17.0 g/dL 9.7(L) 10.5(L) 11.0(L)  Hematocrit 39.0 - 52.0 % 28.6(L) 31.5(L) 32.4(L)  Platelets 150 - 400 K/uL 282 349 374    BMP Latest Ref Rng & Units 11/09/2021 10/30/2021 10/28/2021  Glucose 70 - 99 mg/dL 213(H) 239(H) 182(H)  BUN 8 - 23 mg/dL '17 14 13  ' Creatinine 0.61 - 1.24 mg/dL 0.86 1.06 0.81  BUN/Creat Ratio 10 - 24 - - -  Sodium 135 - 145 mmol/L 130(L) 131(L) 127(L)  Potassium 3.5 - 5.1 mmol/L 4.0 4.5 4.4  Chloride 98 - 111 mmol/L 96(L) 95(L) 91(L)  CO2 22 - 32 mmol/L 25 21(L) 23  Calcium 8.9 - 10.3 mg/dL 9.0 9.6 9.2    BNP    Component Value Date/Time   BNP 67.1 10/28/2021 1407    ProBNP No results found for: PROBNP  PFT No results found for: FEV1PRE, FEV1POST, FVCPRE, FVCPOST, TLC, DLCOUNC, PREFEV1FVCRT, PSTFEV1FVCRT  DG Chest 2 View  Result Date: 10/28/2021 CLINICAL DATA:  Lung cancer EXAM: CHEST - 2 VIEW COMPARISON:  10/23/2021.  CT chest 10/22/2021 FINDINGS: Right perihilar mass and right middle lobe mass as noted on prior CT. Right upper lobe infiltrate is present which may represent postobstructive pneumonia. This is unchanged. Improvement in small right pleural effusion. Left lung is clear.  Negative for heart failure IMPRESSION: Right hilar and right middle lobe mass lesions unchanged. Right upper lobe infiltrate unchanged likely pneumonia. Improvement in right pleural effusion. Electronically Signed   By: Franchot Gallo M.D.   On: 10/28/2021 14:07   DG Chest 2 View  Result Date: 10/23/2021 CLINICAL DATA:  cough , recent dx lung cancer EXAM: CHEST - 2 VIEW COMPARISON:  CT chest 10/22/2021 FINDINGS: The heart and mediastinal contours are otherwise within normal limits. Aortic calcification. A proximally 5 cm right perihilar mass  consistent with known lung cancer. Similar finding within the right middle lobe measuring approximately 7 cm. Patchy right upper lobe airspace opacity. No pulmonary edema. Trace right pleural effusion. No left pleural effusion. No pneumothorax. No acute osseous abnormality. IMPRESSION: 1. Right upper lobe infection/inflammation, possibly postobstructive in the setting of known right perihilar and right middle lobe malignant masses. 2. Associated trace right pleural effusion. Electronically Signed   By: Iven Finn M.D.   On: 10/23/2021 19:46   CT CHEST W CONTRAST  Result Date: 10/22/2021 CLINICAL DATA:  Cough. EXAM: CT CHEST WITH CONTRAST TECHNIQUE: Multidetector CT imaging of the chest was performed during intravenous contrast administration. CONTRAST:  82m OMNIPAQUE IOHEXOL 350 MG/ML SOLN COMPARISON:  May 04, 2017. FINDINGS: Cardiovascular: Atherosclerosis of thoracic aorta is noted without aneurysm or dissection. Normal cardiac size. No pericardial effusion. Mild coronary artery calcifications are noted. Mediastinum/Nodes: Thyroid gland is unremarkable. The esophagus is unremarkable. 2.4 cm left hilar lymph node is noted consistent with metastatic disease. Right hilar mass measuring 5.4 x 5.1 cm is noted concerning for metastatic disease resulting in some degree of postobstructive atelectasis of the right upper lobe. Lungs/Pleura: No pneumothorax is noted. Left lung is clear. Multiple airspace opacities are noted in right upper lobe peripherally concerning for postobstructive atelectasis or pneumonia. As noted previously, large right hilar mass  is noted which may represent primary pulmonary malignancy; this appears to be causing significant narrowing of multiple right upper lobe bronchi. 12 x 9 mm nodule is noted anteriorly in right lower lobe concerning for pulmonary metastatic disease. Small right pleural effusion is noted. 6.1 x 3.9 cm rounded mass is noted in right middle lobe concerning for  malignancy. Upper Abdomen: No acute abnormality. Musculoskeletal: No chest wall abnormality. No acute or significant osseous findings. IMPRESSION: 6.1 x 3.9 cm rounded mass is noted in right middle lobe concerning for primary pulmonary malignancy. Large right hilar mass measuring at least 5.4 x 5.1 cm is noted which is concerning for metastatic disease which is extended into the mediastinum. It appears to be causing significant narrowing of multiple right upper lobe bronchi, which results in postobstructive atelectasis or multifocal pneumonia of the right upper lobe. 2.4 cm left hilar lymph node is noted concerning for metastatic disease. 12 x 9 mm nodule is noted anteriorly in right upper lobe concerning for metastatic disease. These results will be called to the ordering clinician or representative by the Radiologist Assistant, and communication documented in the PACS or zVision Dashboard. Mild coronary artery calcifications are noted. Small right pleural effusion is noted. Aortic Atherosclerosis (ICD10-I70.0). Electronically Signed   By: Marijo Conception M.D.   On: 10/22/2021 16:45   CT Angio Chest PE W/Cm &/Or Wo Cm  Result Date: 10/28/2021 CLINICAL DATA:  Shortness of breath. EXAM: CT ANGIOGRAPHY CHEST WITH CONTRAST TECHNIQUE: Multidetector CT imaging of the chest was performed using the standard protocol during bolus administration of intravenous contrast. Multiplanar CT image reconstructions and MIPs were obtained to evaluate the vascular anatomy. CONTRAST:  15m OMNIPAQUE IOHEXOL 350 MG/ML SOLN COMPARISON:  10/22/2021 FINDINGS: Cardiovascular: No evidence of pulmonary embolus. There is occlusion of the superior branches of the right upper lobe from the large right hilar mass. Heart is normal size. Aorta is normal caliber. Coronary artery and aortic calcifications. Mediastinum/Nodes: Left infrahilar mass measures 2.4 cm, stable since prior study. Right suprahilar mass difficult to measure due to adjacent  postobstructive airways disease, but approximately 6.3 x 5.5 cm. Show Lungs/Pleura: Imaging into the upper abdomen demonstrates no acute findings. As described above, right hilar/suprahilar mass again noted with postobstructive airspace disease throughout the right upper lobe concerning for postobstructive pneumonia. Small right pleural effusion, similar to prior study. Upper Abdomen: Imaging into the upper abdomen demonstrates no acute findings. Musculoskeletal: Chest wall soft tissues are unremarkable. No acute bony abnormality. Review of the MIP images confirms the above findings. IMPRESSION: No evidence of pulmonary embolus. 6.4 x 4.2 cm right middle lobe mass concerning for primary lung cancer. Large right hilar/suprahilar mass/adenopathy with postobstructive airspace disease throughout the right upper lobe concerning for postobstructive pneumonia. Mass also occludes the superior branches of the right pulmonary artery. Small right pleural effusion, stable since prior study. Left hilar adenopathy, stable. Coronary artery disease. Aortic Atherosclerosis (ICD10-I70.0). Electronically Signed   By: KRolm BaptiseM.D.   On: 10/28/2021 21:09   MR Brain W Wo Contrast  Result Date: 11/04/2021 CLINICAL DATA:  Non-small cell lung cancer, staging. EXAM: MRI HEAD WITHOUT AND WITH CONTRAST TECHNIQUE: Multiplanar, multiecho pulse sequences of the brain and surrounding structures were obtained without and with intravenous contrast. CONTRAST:  7.536mGADAVIST GADOBUTROL 1 MMOL/ML IV SOLN COMPARISON:  None. FINDINGS: Brain: No restricted diffusion to suggest acute or subacute infarct. No hemorrhage, mass, mass effect, or midline shift. No abnormal enhancement. No foci of hemosiderin deposition to suggest remote  hemorrhage. No extra-axial collection or hydrocephalus. T2 hyperintense signal in the periventricular white matter, likely the sequela of chronic small vessel ischemic disease. Vascular: Normal flow voids. Skull and  upper cervical spine: Normal marrow signal. Sinuses/Orbits: Negative.  Status post bilateral lens replacements. Other: Trace fluid in right mastoid air cells. IMPRESSION: No acute intracranial process. No evidence of metastatic disease in the brain. Electronically Signed   By: Merilyn Baba M.D.   On: 11/04/2021 22:35   NM PET Image Initial (PI) Skull Base To Thigh (F-18 FDG)  Result Date: 11/05/2021 CLINICAL DATA:  Initial treatment strategy for non-small cell lung cancer in a 75 year old male. EXAM: NUCLEAR MEDICINE PET SKULL BASE TO THIGH TECHNIQUE: 7.49 mCi F-18 FDG was injected intravenously. Full-ring PET imaging was performed from the skull base to thigh after the radiotracer. CT data was obtained and used for attenuation correction and anatomic localization. Fasting blood glucose: 132 mg/dl COMPARISON:  CT of the chest of October 28, 2021 October 22, 2021. FINDINGS: Mediastinal blood pool activity: SUV max 2.60 Liver activity: SUV max NA NECK: No hypermetabolic lymph nodes in the neck. Incidental CT findings: none CHEST: RIGHT paramediastinal and juxta hilar mass measuring at a minimum 7.2 x 5.4 cm (image 82/4) maximum SUV of 14.7. This is inseparable from RIGHT mediastinal fat and RIGHT hilum as on recent CT imaging. Soft tissue tracking along the major fissure in the RIGHT middle lobe is ovoid, nodular and hypermetabolic measuring 5.9 x 4.6 cm with a maximum SUV 28. LEFT mediastinal lymph node with enlargement (image 93/4) this measures approximately 1.8 cm short axis with a maximum SUV of 20.8. RIGHT sided pleural effusion with mildly increased metabolic activity with a maximum SUV of 2.0. Multifocal areas of ground-glass at the periphery of the RIGHT upper lobe more organized but with similar size and distribution when compared to the CT of October 28, 2021 show mild increased metabolic activity. The LEFT lung is clear. Incidental CT findings: Aortic atherosclerosis without aneurysmal dilation.  Mitral annular calcification. Three-vessel coronary artery disease. Small lymph node in the RIGHT axillary region with a maximum SUV of 2.4 shows a fatty hilum and measures 9 mm (image 65/4) ABDOMEN/PELVIS: No abnormal hypermetabolic activity within the liver, pancreas, adrenal glands, or spleen. No hypermetabolic lymph nodes in the abdomen or pelvis. Incidental CT findings: No acute findings relative to liver, gallbladder, pancreas, spleen, adrenal glands, kidneys, stomach, small or large bowel. RIGHT inguinal hernia with incipient herniation of small bowel loops, no sign of obstruction. Appendix is normal. Colonic diverticulosis. Aortic atherosclerosis without aneurysm. SKELETON: No focal hypermetabolic activity to suggest skeletal metastasis. Incidental CT findings: none IMPRESSION: Constellation of findings compatible with bronchogenic neoplasm, multifocal and or metastatic involvement in the bilateral chest. RIGHT hilar, paramediastinal upper lobe mass inseparable from mediastinal fat and RIGHT hilum with marked hypermetabolic features. RIGHT middle lobe mass tracking along the fissure, associated with some nodularity inferiorly that extends across the fissure into the RIGHT lower lobe also hypermetabolic. Hypermetabolic juxta hilar/mediastinal node in the LEFT chest anterior to the aorta is compatible with contralateral nodal disease. Pleural effusion suspicious raises the question of for malignant effusion but without discrete nodularity. Areas of airspace disease in the peripheral RIGHT upper lobe may reflect postobstructive pneumonitis and or areas of pulmonary infarct given that the dominant area about the RIGHT hilum occludes the RIGHT upper lobe arterial branches. Small RIGHT axillary lymph node favored to be reactive or even related to recent vaccination, attention on follow-up. No signs of  disease in the abdomen or pelvis. Specifically, the adrenal glands are normal. Electronically Signed   By:  Zetta Bills M.D.   On: 11/05/2021 13:03   DG Chest Port 1 View  Result Date: 11/09/2021 CLINICAL DATA:  S/P bronchoscopy with biopsy EXAM: PORTABLE CHEST - 1 VIEW COMPARISON:  10/28/2021 FINDINGS: No pneumothorax. Right hilar mass and right middle lobe mass persist. Worsening peripheral airspace consolidation laterally in the right upper lobe. Some increase in interstitial opacities throughout the remainder of the right upper lobe. Left lung clear. Heart size within normal limits. Aortic Atherosclerosis (ICD10-170.0). No effusion. Visualized bones unremarkable. IMPRESSION: 1. No pneumothorax post bronchoscopic biopsy 2. Stable right hilar and middle lobe masses. 3. Right lung interstitial and airspace opacities increased since previous. Electronically Signed   By: Lucrezia Europe M.D.   On: 11/09/2021 12:29   DG C-Arm 1-60 Min-No Report  Result Date: 11/09/2021 Fluoroscopy was utilized by the requesting physician.  No radiographic interpretation.   DG C-ARM BRONCHOSCOPY  Result Date: 11/09/2021 C-ARM BRONCHOSCOPY: Fluoroscopy was utilized by the requesting physician.  No radiographic interpretation.     Past medical hx Past Medical History:  Diagnosis Date   Anemia    CHF (congestive heart failure) (HCC)    Complication of anesthesia    slow to awaken  x1   Dyspnea    Family hx of prostate cancer    IN BROTHER   History of echocardiogram    Echo 5/18: EF 00-86, normal diastolic function, PASP 32   History of nuclear stress test    Myoview 5/18: EF 60, inf defect suspicious for artifact, no ischemia, Low Risk   Hx of basal cell carcinoma    FOLLOWED BY DERMATOLOGIST DR. GOODRICH   Hyperlipidemia    PAF (paroxysmal atrial fibrillation) ([place])    DIAGNOSED 5/18   Pneumonia    Type 2 diabetes mellitus ([place])      Social History   Tobacco Use   Smoking status: Former    Years: 8.00    Types: Cigarettes    Quit date: 04/28/1970    Years since quitting: 51.5   Smokeless tobacco:  Never  Vaping Use   Vaping Use: Never used  Substance Use Topics   Alcohol use: Yes    Alcohol/week: 17.0 standard drinks    Types: 14 Glasses of wine, 3 Shots of liquor per week    Comment: shots - 3.5 - drinks 1/2 shot 7 daysa week.   Drug use: No    Mr.Kerkman reports that he quit smoking about 51 years ago. His smoking use included cigarettes. He has never used smokeless tobacco. He reports current alcohol use of about 17.0 standard drinks per week. He reports that he does not use drugs.  Tobacco Cessation: Pt. Is a former smoker . Quit 1971   Past surgical hx, Family hx, Social hx all reviewed.  Current Outpatient Medications on File Prior to Visit  Medication Sig   ACCU-CHEK SOFTCLIX LANCETS lancets by Other route. Use as instructed   acetaminophen (TYLENOL) 500 MG tablet Take 1,000 mg by mouth every 6 (six) hours as needed for moderate pain.   aspirin EC 81 MG tablet Take 1 tablet (81 mg total) by mouth daily. (Patient taking differently: Take 81 mg by mouth every evening.)   aspirin-sod bicarb-citric acid (ALKA-SELTZER) 325 MG TBEF tablet Take 650 mg by mouth every 6 (six) hours as needed (indigestion).   Blood Glucose Monitoring Suppl (ACCU-CHEK AVIVA PLUS) w/Device KIT by Does not  apply route.   Carboxymethylcellul-Glycerin (LUBRICATING EYE DROPS OP) Place 1 drop into both eyes daily as needed (dry eyes).   dextromethorphan (DELSYM) 30 MG/5ML liquid Take 30 mg by mouth as needed for cough.   dextromethorphan-guaiFENesin (MUCINEX DM) 30-600 MG 12hr tablet Take 1 tablet by mouth 2 (two) times daily as needed (congestion).   DM-APAP-CPM (CORICIDIN HBP PO) Take 1 tablet by mouth daily as needed (congestion).   glimepiride (AMARYL) 4 MG tablet Take 2 mg by mouth daily.   glucose blood test strip 1 each by Other route as needed for other. Use as instructed   LORazepam (ATIVAN) 0.5 MG tablet One tab po 30 min before MRI. Repeat x 1 if needed   metFORMIN (GLUCOPHAGE) 500 MG tablet  Take 1,000 mg by mouth 2 (two) times daily.   Multiple Vitamin (MULTIVITAMIN) tablet Take 1 tablet by mouth daily.   Multiple Vitamins-Minerals (ZINC PO) Take 1 tablet by mouth daily.   simvastatin (ZOCOR) 40 MG tablet Take 40 mg by mouth daily at 6 PM.   Current Facility-Administered Medications on File Prior to Visit  Medication   regadenoson (LEXISCAN) injection SOLN 0.4 mg     No Known Allergies  Review Of Systems:  Constitutional:   No  weight loss, night sweats,  Fevers, chills, + fatigue, or  lassitude.  HEENT:   No headaches,  Difficulty swallowing,  Tooth/dental problems, or  Sore throat,                No sneezing, itching, ear ache, nasal congestion, post nasal drip,   CV:  No chest pain,  Orthopnea, PND, swelling in lower extremities, anasarca, dizziness, palpitations, syncope.   GI  No heartburn, indigestion, abdominal pain, nausea, vomiting, diarrhea, change in bowel habits, loss of appetite, bloody stools.   Resp: + shortness of breath with exertion or at rest.  No excess mucus, no productive cough,  No non-productive cough,  No coughing up of blood.  No change in color of mucus.  No wheezing.  No chest wall deformity  Skin: no rash or lesions.  GU: no dysuria, change in color of urine, no urgency or frequency.  No flank pain, no hematuria   MS:  No joint pain or swelling.  No decreased range of motion.  No back pain.  Psych:  No change in mood or affect. No depression or anxiety.  No memory loss.   Vital Signs BP 140/64 (BP Location: Left Arm, Patient Position: Sitting, Cuff Size: Normal)   Pulse 85   Temp 97.6 F (36.4 C) (Oral)   Ht '5\' 11"'  (1.803 m)   Wt 156 lb 9.6 oz (71 kg)   SpO2 99%   BMI 21.84 kg/m    Physical Exam:  General- No distress,  A&Ox3, pleasant ENT: No sinus tenderness, TM clear, pale nasal mucosa, no oral exudate,no post nasal drip, no LAN Cardiac: S1, S2, regular rate and rhythm, no murmur Chest: + Shortness of breath, No wheeze/  rales/ dullness; no accessory muscle use, no nasal flaring, no sternal retractions, breath sounds are diminished throughout.  Abd.: Soft Non-tender, ND, BS +, Body mass index is 21.84 kg/m.  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 Stage IV (T3, N3, M1  a) neoplasm pending final tissue diagnosis Plan Your cytology is not back yet today. We will call you with the results. Emmit Alexanders with radiation treatment. Follow up with Dr. Earlie Server and Dr. Tammi Klippel as scheduled CXR today to re-evaluate the pneumonia for improvement ( Stat)  Follow up with Dr. Valeta Harms in 2 months. If pneumonia remains a concern, we will see you sooner.  Call for any blood in your sputum, seek emergency care if this is a large amount that does not resolve on its own.  Please contact office for sooner follow up if symptoms do not improve or worsen or seek emergency care    Slow to resolve Pneumonia  Previously treated with antimicrobials Plan CXR today to assess for resolution  We will call you with the results Follow up as noted above  I spent 30 minutes dedicated to the care of this patient on the date of this encounter to include pre-visit review of records, face-to-face time with the patient discussing conditions above, post visit ordering of testing, clinical documentation with the electronic health record, making appropriate referrals as documented, and communicating necessary information to the patient's healthcare team.   Magdalen Spatz, NP 11/16/2021  9:30 AM

## 2021-11-16 NOTE — Telephone Encounter (Signed)
Just as an FYI, I saw [name] [name] today. He is doing well after his procedure, but his cytology has not yet resulted. I wanted you all to be aware. Thanks so much

## 2021-11-17 LAB — CYTOLOGY - NON PAP

## 2021-11-17 NOTE — Telephone Encounter (Signed)
Spoke with patient by phone.  His 10 R nodal biopsy results are available, unclear, show stromal cells, lymphocytes but overall appear to be nondiagnostic.  I do not see that his right middle lobe transbronchial biopsy information has been reported out.  Not clear that were going to get a definitive diagnosis from the samples.  He is seeing Dr. [name] and Dr. Tammi Klippel, needs a tissue diagnosis.  Dr. [name] has requested that we repeat his biopsy and I will work on arranging.  I discussed this with the patient and he agrees.  I will try to get it done on 11/23/2021.  Alternatively 11/30/2021.

## 2021-11-17 NOTE — Addendum Note (Signed)
Addended by: Collene Gobble on: 11/17/2021 03:34 PM   Modules accepted: Orders

## 2021-11-18 ENCOUNTER — Ambulatory Visit
Admission: RE | Admit: 2021-11-18 | Discharge: 2021-11-18 | Disposition: A | Discharge status: Home / Self Care | Payer: Medicare Other | Source: Ambulatory Visit | Attending: Radiation Oncology | Admitting: Radiation Oncology

## 2021-11-18 ENCOUNTER — Encounter: Payer: Self-pay | Admitting: Emergency Medicine

## 2021-11-18 ENCOUNTER — Other Ambulatory Visit: Payer: Self-pay

## 2021-11-18 DIAGNOSIS — C342 Malignant neoplasm of middle lobe, bronchus or lung: Secondary | ICD-10-CM | POA: Diagnosis not present

## 2021-11-19 ENCOUNTER — Ambulatory Visit
Admission: RE | Admit: 2021-11-19 | Discharge: 2021-11-19 | Disposition: A | Discharge status: Home / Self Care | Payer: Medicare Other | Source: Ambulatory Visit | Attending: Radiation Oncology | Admitting: Radiation Oncology

## 2021-11-19 DIAGNOSIS — C342 Malignant neoplasm of middle lobe, bronchus or lung: Secondary | ICD-10-CM | POA: Diagnosis not present

## 2021-11-20 ENCOUNTER — Ambulatory Visit
Admission: RE | Admit: 2021-11-20 | Discharge: 2021-11-20 | Disposition: A | Discharge status: Home / Self Care | Payer: Medicare Other | Source: Ambulatory Visit | Attending: Radiation Oncology | Admitting: Radiation Oncology

## 2021-11-20 ENCOUNTER — Other Ambulatory Visit: Payer: Self-pay

## 2021-11-20 ENCOUNTER — Other Ambulatory Visit: Payer: Self-pay | Admitting: Emergency Medicine

## 2021-11-20 ENCOUNTER — Encounter (HOSPITAL_COMMUNITY): Payer: Self-pay | Admitting: Emergency Medicine

## 2021-11-20 DIAGNOSIS — C342 Malignant neoplasm of middle lobe, bronchus or lung: Secondary | ICD-10-CM | POA: Diagnosis not present

## 2021-11-20 NOTE — Progress Notes (Signed)
DUE TO COVID-19 ONLY ONE VISITOR IS ALLOWED TO COME WITH YOU AND STAY IN THE WAITING ROOM ONLY DURING PRE OP AND PROCEDURE DAY OF SURGERY.   PCP - Dr Domenick Gong Cardiologist - Dr Ena Dawley  Oncology - Dr Curt Bears  Chest x-ray - 11/16/21 (2V) EKG - 10/28/21 Stress Test - 05/12/17 ECHO - 05/12/17 Cardiac Cath - n/a  ICD Pacemaker/Loop - n/a  Sleep Study -  n/a CPAP - none  Do not take Metformin and Glimepiride on the morning of surgery.  If your blood sugar is less than 70 mg/dL, you will need to treat for low blood sugar: Treat a low blood sugar (less than 70 mg/dL) with  cup of clear juice (cranberry or apple), 4 glucose tablets, OR glucose gel. Recheck blood sugar in 15 minutes after treatment (to make sure it is greater than 70 mg/dL). If your blood sugar is not greater than 70 mg/dL on recheck, call [phone] for further instructions.  Aspirin Instructions: Follow your surgeon's instructions on when to stop aspirin prior to surgery,  If no instructions were given by your surgeon then you will need to call the office for those instructions.  Anesthesia review: Yes  STOP now taking any Aspirin (unless otherwise instructed by your surgeon), Aleve, Naproxen, Ibuprofen, Motrin, Advil, Goody's, BC's, all herbal medications, fish oil, and all vitamins.   Coronavirus Screening Covid test on n/a Ambulatory Surgery  Do you have any of the following symptoms:  Cough Yes, nothing new Fever (>100.60F)  yes/no: No Runny nose yes/no: No Sore throat yes/no: No Difficulty breathing/shortness of breath  yes/no: No  Have you traveled in the last 14 days and where? yes/no: No  Patient verbalized understanding of instructions that were given via phone.

## 2021-11-20 NOTE — Anesthesia Preprocedure Evaluation (Addendum)
Anesthesia Evaluation  Patient identified by MRN, date of birth, ID band Patient awake    Reviewed: Allergy & Precautions, NPO status , Patient's Chart, lab work & pertinent test results  History of Anesthesia Complications (+) PROLONGED EMERGENCE and history of anesthetic complications  Airway Mallampati: III  TM Distance: >3 FB Neck ROM: Full  Mouth opening: Limited Mouth Opening Comment: Small mouth opening Dental  (+) Teeth Intact, Dental Advisory Given   Pulmonary former smoker,  RML mass, right hilar mass  s/p video bronchoscopy with robotic assisted bronchoscopic navigation and EBUS 11/09/21, but cytology non-diagnostic  Quit smoking 1971   Pulmonary exam normal breath sounds clear to auscultation       Cardiovascular Normal cardiovascular exam Rhythm:Regular Rate:Normal  Echo 2018: - Left ventricle: The cavity size was normal. Wall thickness was  normal. Systolic function was normal. The estimated ejection  fraction was in the range of 55% to 60%. Left ventricular  diastolic function parameters were normal.  - Pulmonary arteries: PA peak pressure: 32 mm Hg (S).   Nuclear stress test 05/12/17: ? Nuclear stress EF: 60%. ? Blood pressure demonstrated a normal response to exercise. ? No T wave inversion was noted during stress. ? There was no ST segment deviation noted during stress. ? Defect 1: There is a medium defect of moderate severity.  Moderate size and intensity, fixed inferoseptal and inferolateral perfusion defects with normal wall motion - suspicious of artifact. No reversible ischemia. LVEF 60%. This is a low risk study.    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)  alcohol use,   Endo/Other  diabetes, Poorly Controlled, Type 2, Oral Hypoglycemic AgentsFS 194 in preop  Renal/GU negative Renal ROS  negative  genitourinary   Musculoskeletal negative musculoskeletal ROS (+)   Abdominal   Peds  Hematology  (+) Blood dyscrasia, anemia , hct 28.6   Anesthesia Other Findings   Reproductive/Obstetrics negative OB ROS                           Anesthesia Physical Anesthesia Plan  ASA: 3  Anesthesia Plan: General   Post-op Pain Management: Tylenol PO (pre-op)   Induction: Intravenous  PONV Risk Score and Plan: Ondansetron, Dexamethasone and Treatment may vary due to age or medical condition  Airway Management Planned: Oral ETT and Video Laryngoscope Planned  Additional Equipment: None  Intra-op Plan:   Post-operative Plan: Extubation in OR  Informed Consent: I have reviewed the patients History and Physical, chart, labs and discussed the procedure including the risks, benefits and alternatives for the proposed anesthesia with the patient or authorized representative who has indicated his/her understanding and acceptance.     Dental advisory given  Plan Discussed with: CRNA  Anesthesia Plan Comments: (Grade 4 view w/ DL last bronch in Nov 2022, required glidescope 4)      Anesthesia Quick Evaluation
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
## 2021-11-21 LAB — SARS CORONAVIRUS 2 (TAT 6-24 HRS): SARS Coronavirus 2: NEGATIVE

## 2021-11-23 ENCOUNTER — Ambulatory Visit (HOSPITAL_COMMUNITY)
Admission: RE | Admit: 2021-11-23 | Discharge: 2021-11-23 | Disposition: A | Discharge status: Home / Self Care | Payer: Medicare Other | Attending: Emergency Medicine | Admitting: Emergency Medicine

## 2021-11-23 ENCOUNTER — Ambulatory Visit
Admission: RE | Admit: 2021-11-23 | Discharge: 2021-11-23 | Disposition: A | Discharge status: Home / Self Care | Payer: Medicare Other | Source: Ambulatory Visit | Attending: Radiation Oncology | Admitting: Radiation Oncology

## 2021-11-23 ENCOUNTER — Other Ambulatory Visit: Payer: Self-pay

## 2021-11-23 ENCOUNTER — Ambulatory Visit (HOSPITAL_COMMUNITY): Payer: Medicare Other | Admitting: Vascular Surgery

## 2021-11-23 ENCOUNTER — Ambulatory Visit (HOSPITAL_COMMUNITY): Payer: Medicare Other

## 2021-11-23 ENCOUNTER — Encounter (HOSPITAL_COMMUNITY)
Admission: RE | Disposition: A | Discharge status: Home / Self Care | Payer: Self-pay | Source: Home / Self Care | Attending: Emergency Medicine

## 2021-11-23 ENCOUNTER — Encounter (HOSPITAL_COMMUNITY): Payer: Self-pay | Admitting: Emergency Medicine

## 2021-11-23 DIAGNOSIS — R918 Other nonspecific abnormal finding of lung field: Secondary | ICD-10-CM | POA: Diagnosis present

## 2021-11-23 DIAGNOSIS — E119 Type 2 diabetes mellitus without complications: Secondary | ICD-10-CM | POA: Diagnosis not present

## 2021-11-23 DIAGNOSIS — R590 Localized enlarged lymph nodes: Secondary | ICD-10-CM | POA: Diagnosis present

## 2021-11-23 DIAGNOSIS — C342 Malignant neoplasm of middle lobe, bronchus or lung: Secondary | ICD-10-CM | POA: Diagnosis not present

## 2021-11-23 DIAGNOSIS — C3491 Malignant neoplasm of unspecified part of right bronchus or lung: Secondary | ICD-10-CM | POA: Diagnosis not present

## 2021-11-23 DIAGNOSIS — R846 Abnormal cytological findings in specimens from respiratory organs and thorax: Secondary | ICD-10-CM | POA: Diagnosis not present

## 2021-11-23 DIAGNOSIS — Z87891 Personal history of nicotine dependence: Secondary | ICD-10-CM | POA: Diagnosis not present

## 2021-11-23 DIAGNOSIS — Z9889 Other specified postprocedural states: Secondary | ICD-10-CM

## 2021-11-23 DIAGNOSIS — Z419 Encounter for procedure for purposes other than remedying health state, unspecified: Secondary | ICD-10-CM

## 2021-11-23 HISTORY — PX: BRONCHIAL NEEDLE ASPIRATION BIOPSY: SHX5106

## 2021-11-23 HISTORY — PX: VIDEO BRONCHOSCOPY WITH ENDOBRONCHIAL ULTRASOUND: SHX6177

## 2021-11-23 HISTORY — PX: BRONCHIAL BRUSHINGS: SHX5108

## 2021-11-23 HISTORY — PX: BRONCHIAL BIOPSY: SHX5109

## 2021-11-23 LAB — GLUCOSE, CAPILLARY
Glucose-Capillary: 130 mg/dL — ABNORMAL HIGH (ref 70–99)
Glucose-Capillary: 134 mg/dL — ABNORMAL HIGH (ref 70–99)

## 2021-11-23 SURGERY — BRONCHOSCOPY, WITH BIOPSY USING ELECTROMAGNETIC NAVIGATION
Anesthesia: General

## 2021-11-23 MED ORDER — SUCCINYLCHOLINE CHLORIDE 200 MG/10ML IV SOSY
PREFILLED_SYRINGE | INTRAVENOUS | Status: DC | PRN
  Administered 2021-11-23: 100 mg via INTRAVENOUS

## 2021-11-23 MED ORDER — PROMETHAZINE HCL 25 MG/ML IJ SOLN: 6.2500 mg | INTRAMUSCULAR | Status: DC | PRN

## 2021-11-23 MED ORDER — ONDANSETRON HCL 4 MG/2ML IJ SOLN
INTRAMUSCULAR | Status: DC | PRN
  Administered 2021-11-23: 4 mg via INTRAVENOUS

## 2021-11-23 MED ORDER — LACTATED RINGERS IV SOLN: INTRAVENOUS | Status: DC

## 2021-11-23 MED ORDER — FENTANYL CITRATE (PF) 100 MCG/2ML IJ SOLN: 25.0000 ug | INTRAMUSCULAR | Status: DC | PRN

## 2021-11-23 MED ORDER — LIDOCAINE 2% (20 MG/ML) 5 ML SYRINGE
INTRAMUSCULAR | Status: DC | PRN
  Administered 2021-11-23: 50 mg via INTRAVENOUS

## 2021-11-23 MED ORDER — SUGAMMADEX SODIUM 200 MG/2ML IV SOLN
INTRAVENOUS | Status: DC | PRN
  Administered 2021-11-23: 300 mg via INTRAVENOUS

## 2021-11-23 MED ORDER — CHLORHEXIDINE GLUCONATE 0.12 % MT SOLN: 15.0000 mL | Freq: Once | OROMUCOSAL | Status: DC

## 2021-11-23 MED ORDER — ASPIRIN EC 81 MG PO TBEC: 81.0000 mg | DELAYED_RELEASE_TABLET | Freq: Every evening | ORAL | Status: DC

## 2021-11-23 MED ORDER — DEXAMETHASONE SODIUM PHOSPHATE 10 MG/ML IJ SOLN
INTRAMUSCULAR | Status: DC | PRN
  Administered 2021-11-23: 10 mg via INTRAVENOUS

## 2021-11-23 MED ORDER — PROPOFOL 10 MG/ML IV BOLUS
INTRAVENOUS | Status: DC | PRN
  Administered 2021-11-23: 100 mg via INTRAVENOUS
  Administered 2021-11-23: 20 mg via INTRAVENOUS

## 2021-11-23 MED ORDER — PHENYLEPHRINE HCL-NACL 20-0.9 MG/250ML-% IV SOLN
INTRAVENOUS | Status: DC | PRN
Start: 2021-11-23 — End: 2021-11-23
  Administered 2021-11-23: 25 ug/min via INTRAVENOUS

## 2021-11-23 MED ORDER — ROCURONIUM BROMIDE 10 MG/ML (PF) SYRINGE
PREFILLED_SYRINGE | INTRAVENOUS | Status: DC | PRN
  Administered 2021-11-23: 50 mg via INTRAVENOUS
  Administered 2021-11-23: 20 mg via INTRAVENOUS
  Administered 2021-11-23: 50 mg via INTRAVENOUS

## 2021-11-23 MED ORDER — FENTANYL CITRATE (PF) 250 MCG/5ML IJ SOLN
INTRAMUSCULAR | Status: DC | PRN
  Administered 2021-11-23 (×2): 25 ug via INTRAVENOUS

## 2021-11-23 MED ORDER — CHLORHEXIDINE GLUCONATE 0.12 % MT SOLN
OROMUCOSAL | Status: AC
  Filled 2021-11-23: qty 15

## 2021-11-23 NOTE — Discharge Instructions (Signed)
Flexible Bronchoscopy, Care After This sheet gives you information about how to care for yourself after your test. Your doctor may also give you more specific instructions. If you have problems or questions, contact your doctor. Follow these instructions at home: Eating and drinking Do not eat or drink anything (not even water) for 2 hours after your test, or until your numbing medicine (local anesthetic) wears off. When your numbness is gone and your cough and gag reflexes have come back, you may: Eat only soft foods. Slowly drink liquids. The day after the test, go back to your normal diet. Driving Do not drive for 24 hours if you were given a medicine to help you relax (sedative). Do not drive or use heavy machinery while taking prescription pain medicine. General instructions  Take over-the-counter and prescription medicines only as told by your doctor. Return to your normal activities as told. Ask what activities are safe for you. Do not use any products that have nicotine or tobacco in them. This includes cigarettes and e-cigarettes. If you need help quitting, ask your doctor. Keep all follow-up visits as told by your doctor. This is important. It is very important if you had a tissue sample (biopsy) taken. Get help right away if: You have shortness of breath that gets worse. You get light-headed. You feel like you are going to pass out (faint). You have chest pain. You cough up: More than a little blood. More blood than before. Summary Do not eat or drink anything (not even water) for 2 hours after your test, or until your numbing medicine wears off. Do not use cigarettes. Do not use e-cigarettes. Get help right away if you have chest pain.  Please call our office for any questions or concerns.  [phone].   This information is not intended to replace advice given to you by your health care provider. Make sure you discuss any questions you have with your health care  provider. Document Released: 09/26/2009 Document Revised: 11/11/2017 Document Reviewed: 12/17/2016 Elsevier Patient Education  2020 [employer].

## 2021-11-23 NOTE — Interval H&P Note (Signed)
History and Physical Interval Note:  11/23/2021 7:54 AM  [name] [name]  has presented today for surgery, with the diagnosis of RIGHT MIDDLE LOBE MASS; RIGHT UPPER LOPE HILAR MASS; MEDIASTINAL ADENOPATHY.  The various methods of treatment have been discussed with the patient and family. After consideration of risks, benefits and other options for treatment, the patient has consented to  Procedure(s): ROBOTIC ASSISTED NAVIGATIONAL BRONCHOSCOPY (N/A) VIDEO BRONCHOSCOPY WITH ENDOBRONCHIAL ULTRASOUND (N/A) as a surgical intervention.  The patient's history has been reviewed, patient examined, no change in status, stable for surgery.  I have reviewed the patient's chart and labs.  Questions were answered to the patient's satisfaction.     Collene Gobble

## 2021-11-23 NOTE — Transfer of Care (Signed)
Immediate Anesthesia Transfer of Care Note  Patient: Jaeveon Ashland  Procedure(s) Performed: ROBOTIC ASSISTED NAVIGATIONAL BRONCHOSCOPY VIDEO BRONCHOSCOPY WITH ENDOBRONCHIAL ULTRASOUND BRONCHIAL BIOPSIES BRONCHIAL BRUSHINGS BRONCHIAL NEEDLE ASPIRATION BIOPSIES VIDEO BRONCHOSCOPY WITH RADIAL ENDOBRONCHIAL ULTRASOUND  Patient Location: PACU  Anesthesia Type:General  Level of Consciousness: awake, alert  and oriented  Airway & Oxygen Therapy: Patient connected to face mask oxygen  Post-op Assessment: Post -op Vital signs reviewed and stable  Post vital signs: stable  Last Vitals:  Vitals Value Taken Time  BP    Temp    Pulse 88 11/23/21 1024  Resp 13 11/23/21 1024  SpO2 100 % 11/23/21 1024  Vitals shown include unvalidated device data.  Last Pain:  Vitals:   11/23/21 0722  TempSrc:   PainSc: 0-No pain      Patients Stated Pain Goal: 1 (35/46/56 8127)  Complications: No notable events documented.

## 2021-11-23 NOTE — Op Note (Signed)
Video Bronchoscopy with Robotic Assisted Bronchoscopic Navigation and Endobronchial Ultrasound Procedure Note  Date of Operation: 11/23/2021   Pre-op Diagnosis: Right middle lobe mass, right upper lobe perihilar mass, mediastinal adenopathy  Post-op Diagnosis: Same  Surgeon: Baltazar Apo  Assistants: None  Anesthesia: General endotracheal anesthesia  Operation: Flexible video fiberoptic bronchoscopy with robotic assistance and biopsies.  Estimated Blood Loss: Minimal  Complications: None  Indications and History: [name] [name] is a 75 y.o. male with history of former tobacco use.  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  He was told that this was not melanoma postresection.  He has a right middle lobe mass, right upper lobe perihilar mass and mediastinal adenopathy found on CT chest and confirmed to be hypermetabolic by PET scan.  He underwent navigational bronchoscopy in 10/2021 with pathology suggestive of a spindle cell process.  Definitive diagnosis could not be made with the available material.  He returns now for repeat robotic assisted navigational bronchoscopy and biopsies. The risks, benefits, complications, treatment options and expected outcomes were discussed with the patient.  The possibilities of pneumothorax, pneumonia, reaction to medication, pulmonary aspiration, perforation of a viscus, bleeding, failure to diagnose a condition and creating a complication requiring transfusion or operation were discussed with the patient who freely signed the consent.    Description of Procedure: The patient was seen in the Preoperative Area, was examined and was deemed appropriate to proceed.  The patient was taken to [facility] endoscopy room 3, identified as Amarii Bordas and the procedure verified as Flexible Video Fiberoptic Bronchoscopy.  A Time Out was held and the above information confirmed.   Prior to the date of the procedure a high-resolution CT scan of the  chest was performed. Utilizing ION software program a virtual tracheobronchial tree was generated to allow the creation of distinct navigation pathways to the patient's parenchymal abnormalities. After being taken to the operating room general anesthesia was initiated and the patient  was orally intubated. The video fiberoptic bronchoscope was introduced via the endotracheal tube and a general inspection was performed which showed normal left-sided anatomy.  There was an exophytic right upper lobe endobronchial lesion that was distorting the apical and posterior segmental airways.  The anterior segmental airway was for the most part intact.  Endobronchial forceps biopsies were performed on the right upper lobe lesion to be sent for pathology.  The bilateral mainstem bronchi were suctioned free of secretions and then the robotic catheter was inserted into the patient's endotracheal tube.  Endobronchial biopsies: 1. Endobronchial biopsies from right upper lobe endobronchial lesion  Navigation Target #1 right middle lobe mass: The distinct navigation pathway prepared prior to this procedure were then utilized to navigate to patient's lesion identified on CT scan. The robotic catheter was secured into place and the vision probe was withdrawn.  Lesion location was approximated using fluoroscopy and radial endobronchial ultrasound for peripheral targeting. Under fluoroscopic guidance transbronchial needle brushings, transbronchial needle biopsies, and transbronchial forceps biopsies were performed to be sent for cytology and pathology.  Samples Target #1: 1. Transbronchial needle brushings from right middle lobe mass 2. Transbronchial Wang needle biopsies from right middle lobe mass 3. Transbronchial forceps biopsies from right middle lobe mass   The robotic scope was then withdrawn and the endobronchial ultrasound was used to identify and characterize the peritracheal, hilar and bronchial lymph nodes.  Inspection showed significant enlargement of 10 L node. Using real-time ultrasound guidance Wang needle biopsies were take from  Station 10 L node and were sent for cytology.  EBUS Samples: 1. Wang needle biopsies from 10 L node   Plans:  The patient will be discharged from the PACU to home when recovered from anesthesia and after chest x-ray is reviewed. We will review the cytology, pathology and microbiology results with the patient when they become available. Outpatient followup will be with Dr Lamonte Sakai or Dr Valeta Harms.    Baltazar Apo, MD, PhD 11/23/2021, 10:22 AM [facility] [phone] or if no answer before 7:00PM call [phone] For any issues after 7:00PM please call eLink [phone]

## 2021-11-23 NOTE — Anesthesia Postprocedure Evaluation (Signed)
Anesthesia Post Note  Patient: Avry Roedl  Procedure(s) Performed: ROBOTIC ASSISTED NAVIGATIONAL BRONCHOSCOPY VIDEO BRONCHOSCOPY WITH ENDOBRONCHIAL ULTRASOUND BRONCHIAL BIOPSIES BRONCHIAL BRUSHINGS BRONCHIAL NEEDLE ASPIRATION BIOPSIES VIDEO BRONCHOSCOPY WITH RADIAL ENDOBRONCHIAL ULTRASOUND     Patient location during evaluation: PACU Anesthesia Type: General Level of consciousness: awake and alert, oriented and patient cooperative Pain management: pain level controlled Vital Signs Assessment: post-procedure vital signs reviewed and stable Respiratory status: spontaneous breathing, nonlabored ventilation and respiratory function stable Cardiovascular status: blood pressure returned to baseline and stable Postop Assessment: no apparent nausea or vomiting Anesthetic complications: no   No notable events documented.  Last Vitals:  Vitals:   11/23/21 1039 11/23/21 1054  BP: 119/72 116/68  Pulse: 93 84  Resp: 14 10  Temp:  36.8 C  SpO2: 96% 98%    Last Pain:  Vitals:   11/23/21 1054  TempSrc:   PainSc: 0-No pain                 Pervis Hocking

## 2021-11-23 NOTE — Anesthesia Procedure Notes (Signed)
Procedure Name: Intubation Date/Time: 11/23/2021 8:55 AM Performed by: Lavell Luster, CRNA Pre-anesthesia Checklist: Patient identified, Emergency Drugs available, Suction available, Patient being monitored and Timeout performed Patient Re-evaluated:Patient Re-evaluated prior to induction Oxygen Delivery Method: Circle system utilized Preoxygenation: Pre-oxygenation with 100% oxygen Induction Type: IV induction Ventilation: Mask ventilation without difficulty Laryngoscope Size: Mac, 4 and Glidescope Grade View: Grade I Tube type: Oral Tube size: 8.5 mm Number of attempts: 1 Airway Equipment and Method: Rigid stylet Placement Confirmation: ETT inserted through vocal cords under direct vision, positive ETCO2 and breath sounds checked- equal and bilateral Secured at: 24 cm Tube secured with: Tape Dental Injury: Teeth and Oropharynx as per pre-operative assessment

## 2021-11-24 ENCOUNTER — Encounter: Payer: Self-pay | Admitting: *Deleted

## 2021-11-24 ENCOUNTER — Ambulatory Visit
Admission: RE | Admit: 2021-11-24 | Discharge: 2021-11-24 | Disposition: A | Discharge status: Home / Self Care | Payer: Medicare Other | Source: Ambulatory Visit | Attending: Radiation Oncology | Admitting: Radiation Oncology

## 2021-11-24 DIAGNOSIS — C342 Malignant neoplasm of middle lobe, bronchus or lung: Secondary | ICD-10-CM | POA: Diagnosis not present

## 2021-11-24 NOTE — Progress Notes (Signed)
I updated scheduling to call and schedule [name] [name] to see Dr. [name] and his PA-C on 12/19 with labs. I completed scheduling message.

## 2021-11-24 NOTE — Progress Notes (Signed)
Oncology Nurse Navigator Documentation  Oncology Nurse Navigator Flowsheets 11/24/2021 11/04/2021 10/30/2021  Abnormal Finding Date - - 10/28/2021  Confirmed Diagnosis Date 11/23/2021 - -  Diagnosis Status Pathology Pending - Additional Work Up  Planned Course of Treatment Radiation - -  Phase of Treatment Radiation - -  Radiation Actual Start Date: 11/11/2021 - -  Navigator Follow Up Date: 11/27/2021 11/12/2021 11/03/2021  Navigator Follow Up Reason: Pathology Follow-up After Biopsy Appointment Review  Navigator Location [facility] [place]  Navigator Encounter Type Other:;Pathology Review Other: Clinic/MDC;Initial MedOnc  Treatment Initiated Date 11/11/2021 - -  Patient Visit Type Other Other Initial;MedOnc  Treatment Phase Abnormal Scans Abnormal Scans Abnormal Scans  Barriers/Navigation Needs Coordination of Care Coordination of Care Coordination of Care;Education  Education - - Other  Interventions Coordination of Care/I followed up on bx.  Mr. Calixto had bx by Dr. Lamonte Sakai completed yesterday. Pathology results are pending. I will follow up with Dr. [name] to see when patient needs a follow up.  Coordination of Care Coordination of Care;Education;Psycho-Social Support  Acuity Level 2-Minimal Needs (1-2 Barriers Identified) Level 2-Minimal Needs (1-2 Barriers Identified) Level 2-Minimal Needs (1-2 Barriers Identified)  Coordination of Care Pathology Other Other  Education Method - - Verbal;Written  Time Spent with Patient [phone]

## 2021-11-25 ENCOUNTER — Telehealth: Payer: Self-pay | Admitting: Emergency Medicine

## 2021-11-25 ENCOUNTER — Encounter: Payer: Self-pay | Admitting: *Deleted

## 2021-11-25 ENCOUNTER — Telehealth: Payer: Self-pay | Admitting: Physician Assistant

## 2021-11-25 ENCOUNTER — Other Ambulatory Visit: Payer: Self-pay

## 2021-11-25 ENCOUNTER — Encounter (HOSPITAL_COMMUNITY): Payer: Self-pay | Admitting: Emergency Medicine

## 2021-11-25 ENCOUNTER — Ambulatory Visit
Admission: RE | Admit: 2021-11-25 | Discharge: 2021-11-25 | Disposition: A | Discharge status: Home / Self Care | Payer: Medicare Other | Source: Ambulatory Visit | Attending: Radiation Oncology | Admitting: Radiation Oncology

## 2021-11-25 DIAGNOSIS — C342 Malignant neoplasm of middle lobe, bronchus or lung: Secondary | ICD-10-CM | POA: Diagnosis not present

## 2021-11-25 LAB — CYTOLOGY - NON PAP

## 2021-11-25 NOTE — Progress Notes (Signed)
Oncology Nurse Navigator Documentation  Oncology Nurse Navigator Flowsheets 11/25/2021 11/24/2021 11/04/2021 10/30/2021  Abnormal Finding Date - - - 10/28/2021  Confirmed Diagnosis Date - 11/23/2021 - -  Diagnosis Status Confirmed Diagnosis Complete Pathology Pending - Additional Work Up  Planned Course of Treatment - Radiation - -  Phase of Treatment - Radiation - -  Radiation Actual Start Date: - 11/11/2021 - -  Navigator Follow Up Date: - 11/27/2021 11/12/2021 11/03/2021  Navigator Follow Up Reason: - Pathology Follow-up After Biopsy Appointment Review  Navigator Location [facility]  Navigator Encounter Type Telephone Other:;Pathology Review Other: Clinic/[facility]C;Initial MedOnc  Telephone Outgoing Call - - -  Treatment Initiated Date - 11/11/2021 - -  Patient Visit Type Other Other Other Initial;MedOnc  Treatment Phase Pre-Tx/Tx Discussion Abnormal Scans Abnormal Scans Abnormal Scans  Barriers/Navigation Needs Coordination of Care;Education Coordination of Care Coordination of Care Coordination of Care;Education  Education Other - - Other  Interventions Coordination of Care;Education/I received a message from Dr. [name] to call patient and see if he is able to come to the office tomorrow due to final path is back. I called patient. Patient is unable to come in sooner due to going out of town. Patient does have an appt to be seen with med onc on Monday 12/19.  I updated Dr. [name].  Coordination of Care Coordination of Care Coordination of Care;Education;Psycho-Social Support  Acuity Level 2-Minimal Needs (1-2 Barriers Identified) Level 2-Minimal Needs (1-2 Barriers Identified) Level 2-Minimal Needs (1-2 Barriers Identified) Level 2-Minimal Needs (1-2 Barriers Identified)  Coordination of Care Other Pathology Other Other  Education Method Verbal - - Verbal;Written  Time Spent with Patient 30 30 30  45

## 2021-11-25 NOTE — Telephone Encounter (Signed)
Called and spoke with [place]. She stated that Dr. Saralyn Pilar was able to speak with Dr. Lamonte Sakai earlier today and was made aware of the test results.   Nothing further needed at time of call.
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
## 2021-11-25 NOTE — Telephone Encounter (Signed)
Scheduled per sch msg. Called and left msg  

## 2021-11-26 ENCOUNTER — Ambulatory Visit: Payer: Medicare Other

## 2021-11-26 LAB — SURGICAL PATHOLOGY

## 2021-11-27 ENCOUNTER — Telehealth: Payer: Self-pay | Admitting: Emergency Medicine

## 2021-11-27 ENCOUNTER — Ambulatory Visit: Payer: Medicare Other

## 2021-11-27 NOTE — Telephone Encounter (Signed)
Called the patient to review pathology results, he was unavailable and I left a message.  It looks like this is metastatic from his skin lesion that was removed from the scalp.  Dr. Saralyn Pilar believes this is most consistent with desmoplastic melanoma.  Patient has follow-up with Dr. [name] next week.  I will try to call him again to review

## 2021-11-27 NOTE — Telephone Encounter (Signed)
I spoke with the patient by phone.  Explained the biopsy results, shows desmoplastic melanoma.  He has follow-up with Dr. [name] next week.

## 2021-11-30 ENCOUNTER — Other Ambulatory Visit: Payer: Self-pay | Admitting: Internal Medicine

## 2021-11-30 ENCOUNTER — Ambulatory Visit
Admission: RE | Admit: 2021-11-30 | Discharge: 2021-11-30 | Disposition: A | Discharge status: Home / Self Care | Payer: Medicare Other | Source: Ambulatory Visit | Attending: Radiation Oncology | Admitting: Radiation Oncology

## 2021-11-30 ENCOUNTER — Inpatient Hospital Stay: Payer: Medicare Other

## 2021-11-30 ENCOUNTER — Other Ambulatory Visit: Payer: Self-pay

## 2021-11-30 ENCOUNTER — Inpatient Hospital Stay: Payer: Medicare Other | Admitting: Physician Assistant

## 2021-11-30 VITALS — BP 152/64 | HR 70 | Temp 97.9°F | Resp 19 | Ht 71.0 in | Wt 155.3 lb

## 2021-11-30 DIAGNOSIS — C7800 Secondary malignant neoplasm of unspecified lung: Secondary | ICD-10-CM

## 2021-11-30 DIAGNOSIS — C7801 Secondary malignant neoplasm of right lung: Secondary | ICD-10-CM | POA: Diagnosis not present

## 2021-11-30 DIAGNOSIS — C3491 Malignant neoplasm of unspecified part of right bronchus or lung: Secondary | ICD-10-CM

## 2021-11-30 DIAGNOSIS — C439 Malignant melanoma of skin, unspecified: Secondary | ICD-10-CM

## 2021-11-30 DIAGNOSIS — C342 Malignant neoplasm of middle lobe, bronchus or lung: Secondary | ICD-10-CM | POA: Diagnosis not present

## 2021-11-30 LAB — CBC WITH DIFFERENTIAL (CANCER CENTER ONLY)
Abs Immature Granulocytes: 0.04 10*3/uL (ref 0.00–0.07)
Basophils Absolute: 0 10*3/uL (ref 0.0–0.1)
Basophils Relative: 1 %
Eosinophils Absolute: 0.1 10*3/uL (ref 0.0–0.5)
Eosinophils Relative: 2 %
HCT: 30.5 % — ABNORMAL LOW (ref 39.0–52.0)
Hemoglobin: 10.2 g/dL — ABNORMAL LOW (ref 13.0–17.0)
Immature Granulocytes: 1 %
Lymphocytes Relative: 14 %
Lymphs Abs: 0.7 10*3/uL (ref 0.7–4.0)
MCH: 32.4 pg (ref 26.0–34.0)
MCHC: 33.4 g/dL (ref 30.0–36.0)
MCV: 96.8 fL (ref 80.0–100.0)
Monocytes Absolute: 0.6 10*3/uL (ref 0.1–1.0)
Monocytes Relative: 12 %
Neutro Abs: 3.5 10*3/uL (ref 1.7–7.7)
Neutrophils Relative %: 70 %
Platelet Count: 229 10*3/uL (ref 150–400)
RBC: 3.15 MIL/uL — ABNORMAL LOW (ref 4.22–5.81)
RDW: 12.6 % (ref 11.5–15.5)
WBC Count: 4.9 10*3/uL (ref 4.0–10.5)
nRBC: 0 % (ref 0.0–0.2)

## 2021-11-30 LAB — CMP (CANCER CENTER ONLY)
ALT: 17 U/L (ref 0–44)
AST: 16 U/L (ref 15–41)
Albumin: 3.4 g/dL — ABNORMAL LOW (ref 3.5–5.0)
Alkaline Phosphatase: 57 U/L (ref 38–126)
Anion gap: 10 (ref 5–15)
BUN: 17 mg/dL (ref 8–23)
CO2: 23 mmol/L (ref 22–32)
Calcium: 9.5 mg/dL (ref 8.9–10.3)
Chloride: 101 mmol/L (ref 98–111)
Creatinine: 0.94 mg/dL (ref 0.61–1.24)
GFR, Estimated: >60 mL/min (ref >60)
Glucose, Bld: 210 mg/dL — ABNORMAL HIGH (ref 70–99)
Potassium: 4.5 mmol/L (ref 3.5–5.1)
Sodium: 134 mmol/L — ABNORMAL LOW (ref 135–145)
Total Bilirubin: 0.5 mg/dL (ref 0.3–1.2)
Total Protein: 7.4 g/dL (ref 6.5–8.1)

## 2021-11-30 MED ORDER — PROCHLORPERAZINE MALEATE 10 MG PO TABS
10.0000 mg | ORAL_TABLET | Freq: Four times a day (QID) | ORAL | 2 refills | Status: DC | PRN
Start: 2021-11-30 — End: 2022-03-10

## 2021-11-30 NOTE — Patient Instructions (Signed)
Summary:  -The sample (biopsy) that they took of your tumor was consistent with a subtype Melanoma.  -We covered a lot of important information at your appointment today regarding what the treatment plan is moving forward. Here are the the main points that were discussed at your office visit with [place] today:  -The treatment that you will receive consists of two immunotherapy drugs, called Nivolumab and ipilimumab (see below) -We are planning on starting your treatment next week on 01/04/22 but before your start your treatment, I would like you to attend a Chemotherapy Education Class. This involves having you sit down with one of our nurse educators. She will discuss with your one-on-one more details about your treatment as well as general information about resources here at the cancer center.  -Your treatment will be given once every 3 weeks. After 4 rounds of this, then you will just be on one medication Nivolumab every 4 weeks.    Follow up:  -We will see you back for a follow up visit before your first round of treatment.   -If you need to reach [place] at any time, the main office number to the cancer center is [phone], when you call, ask to speak to either Cassie's or Dr. Worthy Flank nurse.

## 2021-11-30 NOTE — Progress Notes (Signed)
START ON PATHWAY REGIMEN - Melanoma and Other Skin Cancers     Cycles 1 through 4: A cycle is every 21 days:     Nivolumab      Ipilimumab    Cycles 5 and beyond: A cycle is every 28 days:     Nivolumab   **Always confirm dose/schedule in your pharmacy ordering system**  Patient Characteristics: Melanoma, Cutaneous/Unknown Primary, Distant Metastases or Unresectable Local Recurrence, Unresectable, Symptomatic, First Line, BRAF V600 Wild Type / BRAF V600 Results Pending or Unknown, Candidate for Immunotherapy Disease Classification: Melanoma Disease Subtype: Cutaneous BRAF V600 Mutation Status: Awaiting BRAF V600 Results Therapeutic Status: Distant Metastases Metastatic Disease Type: Symptomatic Line of Therapy: First Line Immunotherapy Candidate Status: Candidate for Immunotherapy Intent of Therapy: Non-Curative / Palliative Intent, Discussed with Patient

## 2021-12-01 ENCOUNTER — Telehealth: Payer: Self-pay | Admitting: Physician Assistant

## 2021-12-01 ENCOUNTER — Ambulatory Visit
Admission: RE | Admit: 2021-12-01 | Discharge: 2021-12-01 | Disposition: A | Discharge status: Home / Self Care | Payer: Medicare Other | Source: Ambulatory Visit | Attending: Radiation Oncology | Admitting: Radiation Oncology

## 2021-12-01 DIAGNOSIS — C342 Malignant neoplasm of middle lobe, bronchus or lung: Secondary | ICD-10-CM | POA: Diagnosis not present

## 2021-12-01 NOTE — Telephone Encounter (Signed)
Sch per 12/19 los, pt aware °

## 2021-12-02 ENCOUNTER — Ambulatory Visit
Admission: RE | Admit: 2021-12-02 | Discharge: 2021-12-02 | Disposition: A | Discharge status: Home / Self Care | Payer: Medicare Other | Source: Ambulatory Visit | Attending: Radiation Oncology | Admitting: Radiation Oncology

## 2021-12-02 ENCOUNTER — Encounter (HOSPITAL_COMMUNITY): Payer: Self-pay

## 2021-12-02 ENCOUNTER — Other Ambulatory Visit: Payer: Self-pay

## 2021-12-02 DIAGNOSIS — C342 Malignant neoplasm of middle lobe, bronchus or lung: Secondary | ICD-10-CM | POA: Diagnosis not present

## 2021-12-03 ENCOUNTER — Ambulatory Visit: Payer: Medicare Other

## 2021-12-03 ENCOUNTER — Ambulatory Visit
Admission: RE | Admit: 2021-12-03 | Discharge: 2021-12-03 | Disposition: A | Discharge status: Home / Self Care | Payer: Medicare Other | Source: Ambulatory Visit | Attending: Radiation Oncology | Admitting: Radiation Oncology

## 2021-12-03 DIAGNOSIS — C342 Malignant neoplasm of middle lobe, bronchus or lung: Secondary | ICD-10-CM | POA: Diagnosis not present

## 2021-12-04 ENCOUNTER — Inpatient Hospital Stay: Payer: Medicare Other

## 2021-12-04 ENCOUNTER — Other Ambulatory Visit: Payer: Self-pay

## 2021-12-04 ENCOUNTER — Ambulatory Visit: Payer: Medicare Other

## 2021-12-04 ENCOUNTER — Ambulatory Visit
Admission: RE | Admit: 2021-12-04 | Discharge: 2021-12-04 | Disposition: A | Discharge status: Home / Self Care | Payer: Medicare Other | Source: Ambulatory Visit | Attending: Radiation Oncology | Admitting: Radiation Oncology

## 2021-12-04 DIAGNOSIS — C342 Malignant neoplasm of middle lobe, bronchus or lung: Secondary | ICD-10-CM | POA: Diagnosis not present

## 2021-12-08 ENCOUNTER — Ambulatory Visit
Admission: RE | Admit: 2021-12-08 | Discharge: 2021-12-08 | Disposition: A | Discharge status: Home / Self Care | Payer: Medicare Other | Source: Ambulatory Visit | Attending: Radiation Oncology | Admitting: Radiation Oncology

## 2021-12-08 ENCOUNTER — Other Ambulatory Visit: Payer: Self-pay

## 2021-12-08 ENCOUNTER — Encounter (HOSPITAL_COMMUNITY): Payer: Self-pay

## 2021-12-08 DIAGNOSIS — C342 Malignant neoplasm of middle lobe, bronchus or lung: Secondary | ICD-10-CM | POA: Diagnosis not present

## 2021-12-09 ENCOUNTER — Ambulatory Visit
Admission: RE | Admit: 2021-12-09 | Discharge: 2021-12-09 | Disposition: A | Discharge status: Home / Self Care | Payer: Medicare Other | Source: Ambulatory Visit | Attending: Radiation Oncology | Admitting: Radiation Oncology

## 2021-12-09 DIAGNOSIS — C342 Malignant neoplasm of middle lobe, bronchus or lung: Secondary | ICD-10-CM | POA: Diagnosis not present

## 2021-12-09 NOTE — Progress Notes (Signed)
Pharmacist Chemotherapy Monitoring - Initial Assessment    Anticipated start date: 12/17/21   The following has been reviewed per standard work regarding the patient's treatment regimen: The patient's diagnosis, treatment plan and drug doses, and organ/hematologic function Lab orders and baseline tests specific to treatment regimen  The treatment plan start date, drug sequencing, and pre-medications Prior authorization status  Patient's documented medication list, including drug-drug interaction screen and prescriptions for anti-emetics and supportive care specific to the treatment regimen The drug concentrations, fluid compatibility, administration routes, and timing of the medications to be used The patient's access for treatment and lifetime cumulative dose history, if applicable  The patient's medication allergies and previous infusion related reactions, if applicable   Changes made to treatment plan:  N/A  Follow up needed:  N/A  Benn Moulder, PharmD Pharmacy Resident  12/09/2021 2:49 PM

## 2021-12-10 ENCOUNTER — Ambulatory Visit: Payer: Medicare Other

## 2021-12-10 ENCOUNTER — Encounter: Payer: Self-pay | Admitting: *Deleted

## 2021-12-10 ENCOUNTER — Encounter (HOSPITAL_COMMUNITY): Payer: Self-pay

## 2021-12-10 NOTE — Progress Notes (Signed)
Oncology Nurse Navigator Documentation  Oncology Nurse Navigator Flowsheets 12/10/2021 11/25/2021 11/24/2021 11/04/2021 10/30/2021  Abnormal Finding Date - - - - 10/28/2021  Confirmed Diagnosis Date - - 11/23/2021 - -  Diagnosis Status - Confirmed Diagnosis Complete Pathology Pending - Additional Work Up  Planned Course of Treatment - - Radiation - -  Phase of Treatment - - Radiation - -  Radiation Actual Start Date: - - 11/11/2021 - -  Navigator Follow Up Date: - - 11/27/2021 11/12/2021 11/03/2021  Navigator Follow Up Reason: - - Pathology Follow-up After Biopsy Appointment Review  Navigation Complete Date: 12/10/2021 - - - -  Post Navigation: Continue to Follow Patient? No/I followed up on patient's pathology and noted patient has metastatic melanoma.  Due to not having lung cancer, thoracic navigation is no longer needed.  - - - -  Reason Not Navigating Patient: Other: - - - -  Navigator Location [facility]  Navigator Encounter Type - Telephone Other:;Pathology Review Other: Clinic/MDC;Initial MedOnc  Telephone - Outgoing Call - - -  Treatment Initiated Date - - 11/11/2021 - -  Patient Visit Type Other Other Other Other Initial;MedOnc  Treatment Phase Treatment Pre-Tx/Tx Discussion Abnormal Scans Abnormal Scans Abnormal Scans  Barriers/Navigation Needs - Coordination of Care;Education Coordination of Care Coordination of Care Coordination of Care;Education  Education - Other - - Other  Interventions - Coordination of Care;Education Coordination of Care Coordination of Care Coordination of Care;Education;Psycho-Social Support  Acuity Level 1-No Barriers Level 2-Minimal Needs (1-2 Barriers Identified) Level 2-Minimal Needs (1-2 Barriers Identified) Level 2-Minimal Needs (1-2 Barriers Identified) Level 2-Minimal Needs (1-2 Barriers Identified)  Coordination of Care - Other Pathology Other Other  Education Method -  Verbal - - Verbal;Written  Time Spent with Patient 15 30 30 30  45

## 2021-12-11 ENCOUNTER — Ambulatory Visit: Payer: Medicare Other

## 2021-12-15 ENCOUNTER — Other Ambulatory Visit: Payer: Self-pay

## 2021-12-15 ENCOUNTER — Telehealth: Payer: Self-pay | Admitting: Radiation Oncology

## 2021-12-15 ENCOUNTER — Ambulatory Visit
Admission: RE | Admit: 2021-12-15 | Discharge: 2021-12-15 | Disposition: A | Discharge status: Home / Self Care | Payer: Medicare Other | Source: Ambulatory Visit | Attending: Radiation Oncology | Admitting: Radiation Oncology

## 2021-12-15 DIAGNOSIS — C342 Malignant neoplasm of middle lobe, bronchus or lung: Secondary | ICD-10-CM | POA: Diagnosis present

## 2021-12-15 NOTE — Telephone Encounter (Signed)
Radiation Oncology         [phone] ________________________________  Name: [name] [name] MRN: [id number]  Date: 12/15/2021  DOB: 1946-04-18  Telephone contact:  I spoke with Dr. [name] today and called [name] to share recommendations.  When we initially started Radiation therapy, the patient's biopsy report is not finalized.  The course of therapy was designed for locally advanced non-small cell lung cancer and 33 treatments.  Now that the patient's biopsy shows melanoma with strong expression of PD-L1, we will discontinue radiation treatment at a total dose of 30 Gray to facilitate transition to primary immunotherapy.  For this reason, the patient will complete radiation treatment tomorrow.  I shared the rationale for this decision with him and we spoke for a while about it.  Patient is comfortable with the current plan.  We will follow-up with him by telephone in 1 month. ________________________________  Sheral Apley. Tammi Klippel, M.D.

## 2021-12-16 ENCOUNTER — Encounter: Payer: Self-pay | Admitting: Internal Medicine

## 2021-12-16 ENCOUNTER — Encounter: Payer: Self-pay | Admitting: Radiation Oncology

## 2021-12-16 ENCOUNTER — Ambulatory Visit
Admission: RE | Admit: 2021-12-16 | Discharge: 2021-12-16 | Disposition: A | Discharge status: Home / Self Care | Payer: Medicare Other | Source: Ambulatory Visit | Attending: Radiation Oncology | Admitting: Radiation Oncology

## 2021-12-16 DIAGNOSIS — C342 Malignant neoplasm of middle lobe, bronchus or lung: Secondary | ICD-10-CM | POA: Diagnosis not present
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
## 2021-12-17 ENCOUNTER — Inpatient Hospital Stay: Payer: Medicare Other | Attending: Internal Medicine

## 2021-12-17 ENCOUNTER — Telehealth: Payer: Self-pay | Admitting: Physician Assistant

## 2021-12-17 ENCOUNTER — Inpatient Hospital Stay: Payer: Medicare Other

## 2021-12-17 ENCOUNTER — Other Ambulatory Visit: Payer: Self-pay

## 2021-12-17 ENCOUNTER — Ambulatory Visit: Payer: Medicare Other

## 2021-12-17 ENCOUNTER — Inpatient Hospital Stay: Payer: Medicare Other | Admitting: Physician Assistant

## 2021-12-17 VITALS — BP 130/64 | HR 93 | Temp 97.2°F | Resp 17 | Ht 71.0 in | Wt 152.7 lb

## 2021-12-17 DIAGNOSIS — C439 Malignant melanoma of skin, unspecified: Secondary | ICD-10-CM | POA: Diagnosis present

## 2021-12-17 DIAGNOSIS — C7801 Secondary malignant neoplasm of right lung: Secondary | ICD-10-CM | POA: Diagnosis not present

## 2021-12-17 DIAGNOSIS — Z5112 Encounter for antineoplastic immunotherapy: Secondary | ICD-10-CM | POA: Diagnosis present

## 2021-12-17 DIAGNOSIS — C7800 Secondary malignant neoplasm of unspecified lung: Secondary | ICD-10-CM | POA: Diagnosis not present

## 2021-12-17 DIAGNOSIS — Z79899 Other long term (current) drug therapy: Secondary | ICD-10-CM | POA: Insufficient documentation

## 2021-12-17 DIAGNOSIS — R609 Edema, unspecified: Secondary | ICD-10-CM | POA: Diagnosis not present

## 2021-12-17 LAB — CBC WITH DIFFERENTIAL (CANCER CENTER ONLY)
Abs Immature Granulocytes: 0.02 10*3/uL (ref 0.00–0.07)
Basophils Absolute: 0 10*3/uL (ref 0.0–0.1)
Basophils Relative: 1 %
Eosinophils Absolute: 0.1 10*3/uL (ref 0.0–0.5)
Eosinophils Relative: 3 %
HCT: 29.8 % — ABNORMAL LOW (ref 39.0–52.0)
Hemoglobin: 10.1 g/dL — ABNORMAL LOW (ref 13.0–17.0)
Immature Granulocytes: 1 %
Lymphocytes Relative: 12 %
Lymphs Abs: 0.4 10*3/uL — ABNORMAL LOW (ref 0.7–4.0)
MCH: 32 pg (ref 26.0–34.0)
MCHC: 33.9 g/dL (ref 30.0–36.0)
MCV: 94.3 fL (ref 80.0–100.0)
Monocytes Absolute: 0.6 10*3/uL (ref 0.1–1.0)
Monocytes Relative: 16 %
Neutro Abs: 2.5 10*3/uL (ref 1.7–7.7)
Neutrophils Relative %: 67 %
Platelet Count: 233 10*3/uL (ref 150–400)
RBC: 3.16 MIL/uL — ABNORMAL LOW (ref 4.22–5.81)
RDW: 12.5 % (ref 11.5–15.5)
WBC Count: 3.7 10*3/uL — ABNORMAL LOW (ref 4.0–10.5)
nRBC: 0 % (ref 0.0–0.2)

## 2021-12-17 LAB — CMP (CANCER CENTER ONLY)
ALT: 13 U/L (ref 0–44)
AST: 13 U/L — ABNORMAL LOW (ref 15–41)
Albumin: 3.7 g/dL (ref 3.5–5.0)
Alkaline Phosphatase: 57 U/L (ref 38–126)
Anion gap: 10 (ref 5–15)
BUN: 17 mg/dL (ref 8–23)
CO2: 24 mmol/L (ref 22–32)
Calcium: 9.8 mg/dL (ref 8.9–10.3)
Chloride: 98 mmol/L (ref 98–111)
Creatinine: 0.94 mg/dL (ref 0.61–1.24)
GFR, Estimated: >60 mL/min
Glucose, Bld: 222 mg/dL — ABNORMAL HIGH (ref 70–99)
Potassium: 4.2 mmol/L (ref 3.5–5.1)
Sodium: 132 mmol/L — ABNORMAL LOW (ref 135–145)
Total Bilirubin: 0.3 mg/dL (ref 0.3–1.2)
Total Protein: 7.1 g/dL (ref 6.5–8.1)

## 2021-12-17 LAB — TSH: TSH: 1.989 u[IU]/mL (ref 0.320–4.118)

## 2021-12-17 MED ORDER — SODIUM CHLORIDE 0.9 % IV SOLN
3.0000 mg/kg | Freq: Once | INTRAVENOUS | Status: AC
  Administered 2021-12-17: 200 mg via INTRAVENOUS
  Filled 2021-12-17: qty 40

## 2021-12-17 MED ORDER — FAMOTIDINE 20 MG IN NS 100 ML IVPB
20.0000 mg | Freq: Once | INTRAVENOUS | Status: AC
  Administered 2021-12-17: 20 mg via INTRAVENOUS

## 2021-12-17 MED ORDER — SODIUM CHLORIDE 0.9 % IV SOLN
1.0000 mg/kg | Freq: Once | INTRAVENOUS | Status: AC
  Administered 2021-12-17: 70 mg via INTRAVENOUS
  Filled 2021-12-17: qty 7

## 2021-12-17 MED ORDER — DIPHENHYDRAMINE HCL 50 MG/ML IJ SOLN
25.0000 mg | Freq: Once | INTRAMUSCULAR | Status: AC
  Administered 2021-12-17: 25 mg via INTRAVENOUS
  Filled 2021-12-17: qty 1

## 2021-12-17 MED ORDER — SODIUM CHLORIDE 0.9 % IV SOLN: Freq: Once | INTRAVENOUS | Status: AC

## 2021-12-17 NOTE — Telephone Encounter (Signed)
Called the patient's wife to pass on information about her question about their upcoming trip in May 2023.

## 2021-12-17 NOTE — Progress Notes (Signed)
Patient tolerated opdivo and ervoy infusions well, all questions answered, ambulatory to lobby with no complaints.

## 2021-12-17 NOTE — Patient Instructions (Signed)
[facility]  Discharge Instructions: Thank you for choosing [facility] to provide your oncology and hematology care.   If you have a lab appointment with the [facility], please go directly to the [facility] and check in at the registration area.   Wear comfortable clothing and clothing appropriate for easy access to any Portacath or PICC line.   We strive to give you quality time with your provider. You may need to reschedule your appointment if you arrive late (15 or more minutes).  Arriving late affects you and other patients whose appointments are after yours.  Also, if you miss three or more appointments without notifying the office, you may be dismissed from the clinic at the providers discretion.      For prescription refill requests, have your pharmacy contact our office and allow 72 hours for refills to be completed.    Today you received the following chemotherapy and/or immunotherapy agents: Opdivo(nivolumab) and yervoy (Ipilimumab)   To help prevent nausea and vomiting after your treatment, we encourage you to take your nausea medication as directed.  BELOW ARE SYMPTOMS THAT SHOULD BE REPORTED IMMEDIATELY: *FEVER GREATER THAN 100.4 F (38 C) OR HIGHER *CHILLS OR SWEATING *NAUSEA AND VOMITING THAT IS NOT CONTROLLED WITH YOUR NAUSEA MEDICATION *UNUSUAL SHORTNESS OF BREATH *UNUSUAL BRUISING OR BLEEDING *URINARY PROBLEMS (pain or burning when urinating, or frequent urination) *BOWEL PROBLEMS (unusual diarrhea, constipation, pain near the anus) TENDERNESS IN MOUTH AND THROAT WITH OR WITHOUT PRESENCE OF ULCERS (sore throat, sores in mouth, or a toothache) UNUSUAL RASH, SWELLING OR PAIN  UNUSUAL VAGINAL DISCHARGE OR ITCHING   Items with * indicate a potential emergency and should be followed up as soon as possible or go to the Emergency Department if any problems should occur.  Please show the CHEMOTHERAPY ALERT CARD or  IMMUNOTHERAPY ALERT CARD at check-in to the Emergency Department and triage nurse.  Should you have questions after your visit or need to cancel or reschedule your appointment, please contact [facility]  Dept: [phone]  and follow the prompts.  Office hours are 8:00 a.m. to 4:30 p.m. Monday - Friday. Please note that voicemails left after 4:00 p.m. may not be returned until the following business day.  We are closed weekends and major holidays. You have access to a nurse at all times for urgent questions. Please call the main number to the clinic Dept: [phone] and follow the prompts.   For any non-urgent questions, you may also contact your provider using MyChart. We now offer e-Visits for anyone 44 and older to request care online for non-urgent symptoms. For details visit [email or web address].   Also download the MyChart app! Go to the app store, search "MyChart", open the app, select [facility], and log in with your MyChart username and password.  Due to Covid, a mask is required upon entering the hospital/clinic. If you do not have a mask, one will be given to you upon arrival. For doctor visits, patients may have 1 support person aged 50 or older with them. For treatment visits, patients cannot have anyone with them due to current Covid guidelines and our immunocompromised population.   Nivolumab injection What is this medication? NIVOLUMAB (nye VOL ue mab) is a monoclonal antibody. It treats certain types of cancer. Some of the cancers treated are colon cancer, head and neck cancer, Hodgkin lymphoma, lung cancer, and melanoma. This medicine may be used for other purposes; ask your  health care provider or pharmacist if you have questions. COMMON BRAND NAME(S): Opdivo What should I tell my care team before I take this medication? They need to know if you have any of these conditions: Autoimmune diseases such as Crohn's disease, ulcerative colitis, or  lupus Have had or planning to have an allogeneic stem cell transplant (uses someone else's stem cells) History of chest radiation Organ transplant Nervous system problems such as myasthenia gravis or Guillain-Barre syndrome An unusual or allergic reaction to nivolumab, other medicines, foods, dyes, or preservatives Pregnant or trying to get pregnant Breast-feeding How should I use this medication? This medication is injected into a vein. It is given in a hospital or clinic setting. A special MedGuide will be given to you before each treatment. Be sure to read this information carefully each time. Talk to your care team regarding the use of this medication in children. While it may be prescribed for children as young as 12 years for selected conditions, precautions do apply. Overdosage: If you think you have taken too much of this medicine contact a poison control center or emergency room at once. NOTE: This medicine is only for you. Do not share this medicine with others. What if I miss a dose? Keep appointments for follow-up doses. It is important not to miss your dose. Call your care team if you are unable to keep an appointment. What may interact with this medication? Interactions have not been studied. This list may not describe all possible interactions. Give your health care provider a list of all the medicines, herbs, non-prescription drugs, or dietary supplements you use. Also tell them if you smoke, drink alcohol, or use illegal drugs. Some items may interact with your medicine. What should I watch for while using this medication? Your condition will be monitored carefully while you are receiving this medication. You may need blood work done while you are taking this medication. Do not become pregnant while taking this medication or for 5 months after stopping it. Women should inform their care team if they wish to become pregnant or think they might be pregnant. There is a potential  for serious harm to an unborn child. Talk to your care team for more information. Do not breast-feed an infant while taking this medication or for 5 months after stopping it. What side effects may I notice from receiving this medication? Side effects that you should report to your care team as soon as possible: Allergic reactions--skin rash, itching, hives, swelling of the face, lips, tongue, or throat Bloody or black, tar-like stools Change in vision Chest pain Diarrhea Dry cough, shortness of breath or trouble breathing Eye pain Fast or irregular heartbeat Fever, chills High blood sugar (hyperglycemia)--increased thirst or amount of urine, unusual weakness or fatigue, blurry vision High thyroid levels (hyperthyroidism)--fast or irregular heartbeat, weight loss, excessive sweating or sensitivity to heat, tremors or shaking, anxiety, nervousness, irregular menstrual cycle or spotting Kidney injury--decrease in the amount of urine, swelling of the ankles, hands, or feet Liver injury--right upper belly pain, loss of appetite, nausea, light-colored stool, dark yellow or brown urine, yellowing skin or eyes, unusual weakness or fatigue Low red blood cell count--unusual weakness or fatigue, dizziness, headache, trouble breathing Low thyroid levels (hypothyroidism)--unusual weakness or fatigue, increased sensitivity to cold, constipation, hair loss, dry skin, weight gain, feelings of depression Mood and behavior changes-confusion, change in sex drive or performance, irritability Muscle pain or cramps Pain, tingling, or numbness in the hands or feet, muscle weakness, trouble  walking, loss of balance or coordination Red or dark brown urine Redness, blistering, peeling, or loosening of the skin, including inside the mouth Stomach pain Unusual bruising or bleeding Side effects that usually do not require medical attention (report to your care team if they continue or are bothersome): Bone  pain Constipation Loss of appetite Nausea Tiredness Vomiting This list may not describe all possible side effects. Call your doctor for medical advice about side effects. You may report side effects to FDA at 1-800-FDA-1088. Where should I keep my medication? This medication is given in a hospital or clinic and will not be stored at home. NOTE: This sheet is a summary. It may not cover all possible information. If you have questions about this medicine, talk to your doctor, pharmacist, or health care provider.  2022 Elsevier/Gold Standard (2021-08-18 00:00:00)  Ipilimumab injection What is this medication? IPILIMUMAB (IP i LIM ue mab) is a monoclonal antibody. It treats colorectal cancer, esophageal cancer, kidney cancer, liver cancer, lung cancer, melanoma, and mesothelioma. This medicine may be used for other purposes; ask your health care provider or pharmacist if you have questions. COMMON BRAND NAME(S): YERVOY What should I tell my care team before I take this medication? They need to know if you have any of these conditions: autoimmune diseases like Crohn's disease, ulcerative colitis, or lupus have had or planning to have an allogeneic stem cell transplant (uses someone else's stem cells) history of organ transplant nervous system problems like myasthenia gravis or Guillain-Barre syndrome an unusual or allergic reaction to ipilimumab, other medicines, foods, dyes, or preservatives pregnant or trying to get pregnant breast-feeding How should I use this medication? This medicine is for infusion into a vein. It is given by a health care professional in a hospital or clinic setting. A special MedGuide will be given to you before each treatment. Be sure to read this information carefully each time. Talk to your pediatrician regarding the use of this medicine in children. While this drug may be prescribed for children as young as 12 years for selected conditions, precautions do  apply. Overdosage: If you think you have taken too much of this medicine contact a poison control center or emergency room at once. NOTE: This medicine is only for you. Do not share this medicine with others. What if I miss a dose? It is important not to miss your dose. Call your doctor or health care professional if you are unable to keep an appointment. What may interact with this medication? Interactions are not expected. This list may not describe all possible interactions. Give your health care provider a list of all the medicines, herbs, non-prescription drugs, or dietary supplements you use. Also tell them if you smoke, drink alcohol, or use illegal drugs. Some items may interact with your medicine. What should I watch for while using this medication? Tell your doctor or healthcare professional if your symptoms do not start to get better or if they get worse. Do not become pregnant while taking this medicine or for 3 months after stopping it. Women should inform their doctor if they wish to become pregnant or think they might be pregnant. There is a potential for serious side effects to an unborn child. Talk to your health care professional or pharmacist for more information. Do not breast-feed an infant while taking this medicine or for 3 months after the last dose. Your condition will be monitored carefully while you are receiving this medicine. You may need blood work done while  you are taking this medicine. What side effects may I notice from receiving this medication? Side effects that you should report to your doctor or health care professional as soon as possible: allergic reactions like skin rash, itching or hives, swelling of the face, lips, or tongue black, tarry stools bloody or watery diarrhea changes in vision dizziness eye pain fast, irregular heartbeat feeling anxious feeling faint or lightheaded, falls nausea, vomiting pain, tingling, numbness in the hands or  feet redness, blistering, peeling or loosening of the skin, including inside the mouth signs and symptoms of liver injury like dark yellow or brown urine; general ill feeling or flu-like symptoms; light-colored stools; loss of appetite; nausea; right upper belly pain; unusually weak or tired; yellowing of the eyes or skin unusual bleeding or bruising Side effects that usually do not require medical attention (report to your doctor or health care professional if they continue or are bothersome): headache loss of appetite trouble sleeping This list may not describe all possible side effects. Call your doctor for medical advice about side effects. You may report side effects to FDA at 1-800-FDA-1088. Where should I keep my medication? This drug is given in a hospital or clinic and will not be stored at home. NOTE: This sheet is a summary. It may not cover all possible information. If you have questions about this medicine, talk to your doctor, pharmacist, or health care provider.  2022 Elsevier/Gold Standard (2021-08-18 00:00:00)

## 2021-12-18 ENCOUNTER — Ambulatory Visit: Payer: Medicare Other

## 2021-12-21 ENCOUNTER — Ambulatory Visit: Payer: Medicare Other

## 2021-12-21 ENCOUNTER — Telehealth: Payer: Self-pay

## 2021-12-21 NOTE — Telephone Encounter (Signed)
Mr. [name] [name] is doing well after treatment.   He had some nausea with some gagging while  coughing up some phlegm. He is eating small meals as he has been. Drinking fluids well.\ He is still SOB but he states that it has slightly improved since treatment. His blood sugar was high after the treatment. His wife call his PCP office on Saturday 12-19-20. BS~260.  He was told that he could take an additional glimepiride 4 mg tablet that day. BS today is 160. Pt may have had some sweets yesterday per his wife.Suggested that she call his PCP and give them the readings of BS and see if they want to have him take an extra glimepiride 4 mg today or give him parameter for taking the glimepiride on a prn basis. Wife verbalized understanding.  He knows to call the office if he has any  concerns or questions.

## 2021-12-22 ENCOUNTER — Ambulatory Visit: Payer: Medicare Other

## 2021-12-23 ENCOUNTER — Ambulatory Visit: Payer: Medicare Other

## 2021-12-24 ENCOUNTER — Telehealth: Payer: Self-pay | Admitting: Medical Oncology

## 2021-12-24 ENCOUNTER — Ambulatory Visit: Payer: Medicare Other

## 2021-12-24 NOTE — Telephone Encounter (Signed)
Itchy Rash on buttocks only-started 2-3 days ago , about a week after 1st Nivolumab.  He denies trouble breathing or swelling ( except ankles).  He has used OTC lotion. I instructed him to apply hydrocortisone cream to rash and to take OTC benadryl for itching if needed.  Mohamed to advise.

## 2021-12-25 ENCOUNTER — Ambulatory Visit: Payer: Medicare Other

## 2021-12-28 ENCOUNTER — Other Ambulatory Visit: Payer: Self-pay | Admitting: Medical Oncology

## 2021-12-28 ENCOUNTER — Inpatient Hospital Stay: Payer: Medicare Other

## 2021-12-28 ENCOUNTER — Other Ambulatory Visit: Payer: Self-pay

## 2021-12-28 ENCOUNTER — Telehealth: Payer: Self-pay | Admitting: Internal Medicine

## 2021-12-28 ENCOUNTER — Ambulatory Visit: Payer: Medicare Other

## 2021-12-28 ENCOUNTER — Telehealth: Payer: Self-pay | Admitting: Medical Oncology

## 2021-12-28 ENCOUNTER — Inpatient Hospital Stay: Payer: Medicare Other | Admitting: Physician Assistant

## 2021-12-28 VITALS — BP 111/69 | HR 88 | Temp 98.4°F | Resp 18 | Ht 71.0 in | Wt 154.7 lb

## 2021-12-28 DIAGNOSIS — R630 Anorexia: Secondary | ICD-10-CM

## 2021-12-28 DIAGNOSIS — G4700 Insomnia, unspecified: Secondary | ICD-10-CM

## 2021-12-28 DIAGNOSIS — C7800 Secondary malignant neoplasm of unspecified lung: Secondary | ICD-10-CM

## 2021-12-28 DIAGNOSIS — Z5112 Encounter for antineoplastic immunotherapy: Secondary | ICD-10-CM | POA: Diagnosis not present

## 2021-12-28 DIAGNOSIS — C439 Malignant melanoma of skin, unspecified: Secondary | ICD-10-CM

## 2021-12-28 DIAGNOSIS — R21 Rash and other nonspecific skin eruption: Secondary | ICD-10-CM | POA: Insufficient documentation

## 2021-12-28 LAB — CBC WITH DIFFERENTIAL (CANCER CENTER ONLY)
Abs Immature Granulocytes: 0.07 10*3/uL (ref 0.00–0.07)
Basophils Absolute: 0 10*3/uL (ref 0.0–0.1)
Basophils Relative: 0 %
Eosinophils Absolute: 0.4 10*3/uL (ref 0.0–0.5)
Eosinophils Relative: 6 %
HCT: 27.9 % — ABNORMAL LOW (ref 39.0–52.0)
Hemoglobin: 9.6 g/dL — ABNORMAL LOW (ref 13.0–17.0)
Immature Granulocytes: 1 %
Lymphocytes Relative: 9 %
Lymphs Abs: 0.5 10*3/uL — ABNORMAL LOW (ref 0.7–4.0)
MCH: 31.6 pg (ref 26.0–34.0)
MCHC: 34.4 g/dL (ref 30.0–36.0)
MCV: 91.8 fL (ref 80.0–100.0)
Monocytes Absolute: 0.5 10*3/uL (ref 0.1–1.0)
Monocytes Relative: 7 %
Neutro Abs: 4.7 10*3/uL (ref 1.7–7.7)
Neutrophils Relative %: 77 %
Platelet Count: 213 10*3/uL (ref 150–400)
RBC: 3.04 MIL/uL — ABNORMAL LOW (ref 4.22–5.81)
RDW: 12.7 % (ref 11.5–15.5)
WBC Count: 6.2 10*3/uL (ref 4.0–10.5)
nRBC: 0 % (ref 0.0–0.2)

## 2021-12-28 MED ORDER — METHYLPREDNISOLONE 4 MG PO TBPK: ORAL_TABLET | ORAL | 0 refills | Status: DC

## 2021-12-28 MED ORDER — HYDROCORTISONE 2.5 % EX CREA: TOPICAL_CREAM | Freq: Two times a day (BID) | CUTANEOUS | 0 refills | Status: DC

## 2021-12-28 MED ORDER — MIRTAZAPINE 7.5 MG PO TABS: 7.5000 mg | ORAL_TABLET | Freq: Every day | ORAL | 2 refills | Status: DC

## 2021-12-28 NOTE — Telephone Encounter (Signed)
Worsening rash in pelvic area and down inner thighs. Itching. He "hurts all over and is very tired. He does not have an appetite".  Mohamed advised pt see [facility] Provider.

## 2021-12-28 NOTE — Telephone Encounter (Signed)
Scheduled appointment per 1/16 scheduling message. Talked with patients wife and she is aware of today's appointment.
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
## 2021-12-29 ENCOUNTER — Ambulatory Visit: Payer: Medicare Other

## 2021-12-29 ENCOUNTER — Encounter: Payer: Self-pay | Admitting: Internal Medicine

## 2021-12-30 ENCOUNTER — Ambulatory Visit: Payer: Medicare Other

## 2021-12-31 ENCOUNTER — Ambulatory Visit: Payer: Medicare Other

## 2022-01-01 ENCOUNTER — Ambulatory Visit: Payer: Medicare Other

## 2022-01-04 ENCOUNTER — Ambulatory Visit: Payer: Medicare Other

## 2022-01-05 ENCOUNTER — Ambulatory Visit: Payer: Medicare Other

## 2022-01-06 ENCOUNTER — Ambulatory Visit: Payer: Medicare Other

## 2022-01-07 ENCOUNTER — Encounter: Payer: Self-pay | Admitting: Internal Medicine

## 2022-01-07 ENCOUNTER — Other Ambulatory Visit: Payer: Self-pay

## 2022-01-07 ENCOUNTER — Ambulatory Visit: Payer: Medicare Other

## 2022-01-07 ENCOUNTER — Inpatient Hospital Stay: Payer: Medicare Other

## 2022-01-07 ENCOUNTER — Inpatient Hospital Stay: Payer: Medicare Other | Admitting: Internal Medicine

## 2022-01-07 VITALS — BP 123/59 | HR 97 | Temp 97.6°F | Resp 17 | Ht 71.0 in | Wt 148.2 lb

## 2022-01-07 DIAGNOSIS — C439 Malignant melanoma of skin, unspecified: Secondary | ICD-10-CM

## 2022-01-07 DIAGNOSIS — Z5112 Encounter for antineoplastic immunotherapy: Secondary | ICD-10-CM

## 2022-01-07 DIAGNOSIS — C7800 Secondary malignant neoplasm of unspecified lung: Secondary | ICD-10-CM

## 2022-01-07 DIAGNOSIS — C7801 Secondary malignant neoplasm of right lung: Secondary | ICD-10-CM

## 2022-01-07 LAB — CMP (CANCER CENTER ONLY)
ALT: 29 U/L (ref 0–44)
AST: 21 U/L (ref 15–41)
Albumin: 3.1 g/dL — ABNORMAL LOW (ref 3.5–5.0)
Alkaline Phosphatase: 50 U/L (ref 38–126)
Anion gap: 8 (ref 5–15)
BUN: 20 mg/dL (ref 8–23)
CO2: 26 mmol/L (ref 22–32)
Calcium: 10.2 mg/dL (ref 8.9–10.3)
Chloride: 94 mmol/L — ABNORMAL LOW (ref 98–111)
Creatinine: 0.93 mg/dL (ref 0.61–1.24)
GFR, Estimated: >60 mL/min (ref >60)
Glucose, Bld: 242 mg/dL — ABNORMAL HIGH (ref 70–99)
Potassium: 4.2 mmol/L (ref 3.5–5.1)
Sodium: 128 mmol/L — ABNORMAL LOW (ref 135–145)
Total Bilirubin: 0.5 mg/dL (ref 0.3–1.2)
Total Protein: 6.8 g/dL (ref 6.5–8.1)

## 2022-01-07 LAB — CBC WITH DIFFERENTIAL (CANCER CENTER ONLY)
Abs Immature Granulocytes: 0.03 10*3/uL (ref 0.00–0.07)
Basophils Absolute: 0 10*3/uL (ref 0.0–0.1)
Basophils Relative: 1 %
Eosinophils Absolute: 0.2 10*3/uL (ref 0.0–0.5)
Eosinophils Relative: 3 %
HCT: 29.3 % — ABNORMAL LOW (ref 39.0–52.0)
Hemoglobin: 9.9 g/dL — ABNORMAL LOW (ref 13.0–17.0)
Immature Granulocytes: 1 %
Lymphocytes Relative: 18 %
Lymphs Abs: 1.2 10*3/uL (ref 0.7–4.0)
MCH: 31.4 pg (ref 26.0–34.0)
MCHC: 33.8 g/dL (ref 30.0–36.0)
MCV: 93 fL (ref 80.0–100.0)
Monocytes Absolute: 0.7 10*3/uL (ref 0.1–1.0)
Monocytes Relative: 10 %
Neutro Abs: 4.3 10*3/uL (ref 1.7–7.7)
Neutrophils Relative %: 67 %
Platelet Count: 235 10*3/uL (ref 150–400)
RBC: 3.15 MIL/uL — ABNORMAL LOW (ref 4.22–5.81)
RDW: 13.2 % (ref 11.5–15.5)
WBC Count: 6.4 10*3/uL (ref 4.0–10.5)
nRBC: 0 % (ref 0.0–0.2)

## 2022-01-07 LAB — TSH: TSH: 1.768 u[IU]/mL (ref 0.320–4.118)

## 2022-01-07 MED ORDER — SODIUM CHLORIDE 0.9 % IV SOLN: Freq: Once | INTRAVENOUS | Status: AC

## 2022-01-07 MED ORDER — SODIUM CHLORIDE 0.9 % IV SOLN
3.0000 mg/kg | Freq: Once | INTRAVENOUS | Status: AC
  Administered 2022-01-07: 200 mg via INTRAVENOUS
  Filled 2022-01-07: qty 40

## 2022-01-07 MED ORDER — SODIUM CHLORIDE 0.9 % IV SOLN
1.0000 mg/kg | Freq: Once | INTRAVENOUS | Status: AC
  Administered 2022-01-07: 70 mg via INTRAVENOUS
  Filled 2022-01-07: qty 7

## 2022-01-07 MED ORDER — DIPHENHYDRAMINE HCL 50 MG/ML IJ SOLN
25.0000 mg | Freq: Once | INTRAMUSCULAR | Status: AC
  Administered 2022-01-07: 25 mg via INTRAVENOUS
  Filled 2022-01-07: qty 1

## 2022-01-07 MED ORDER — FAMOTIDINE 20 MG IN NS 100 ML IVPB
20.0000 mg | Freq: Once | INTRAVENOUS | Status: AC
  Administered 2022-01-07: 20 mg via INTRAVENOUS
  Filled 2022-01-07: qty 100
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
## 2022-01-07 NOTE — Progress Notes (Signed)
Per RN and pt his rash was addressed by MD and he is done with his medrol dose pak

## 2022-01-07 NOTE — Progress Notes (Signed)
Ipilimumab (YERVOY) Patient Monitoring Assessment   Is the patient experiencing any of the following general symptoms?:  [] Difficulty performing normal activities [] Feeling sluggish or cold all the time [] Unusual weight gain [] Constant or unusual headaches [] Feeling dizzy or faint [] Changes in eyesight (blurry vision, double vision, or other vision problems) [] Changes in mood or behavior (ex: decreased sex drive, irritability, or forgetfulness) [] Starting new medications (ex: steroids, other medications that lower immune response) [x] Patient is not experiencing any of the general symptoms above.    Gastrointestinal  Patient is having 1 bowel movements each day.  Is this different from baseline? [] Yes [x] No Are your stools watery or do they have a foul smell? [] Yes [x] No Have you seen blood in your stools? [] Yes [x] No Are your stools dark, tarry, or sticky? [] Yes [x] No Are you having pain or tenderness in your belly? [] Yes [x] No  Skin Does your skin itch? [x] Yes [] No Do you have a rash? [x] Yes [] No Has your skin blistered and/or peeled? [] Yes [x] No Do you have sores in your mouth? [] Yes [x] No  Hepatic Has your urine been dark or tea colored? [] Yes [x] No Have you noticed that your skin or the whites of your eyes are turning yellow? [] Yes [x] No Are you bleeding or bruising more easily than normal? [] Yes [x] No Are you nauseous and/or vomiting? [] Yes [x] No Do you have pain on the right side of your stomach? [] Yes [x] No  Neurologic  Are you having unusual weakness of legs, arms, or face? [] Yes [x] No Are you having numbness or tingling in your hands or feet? [] Yes [x] No  Thyra Breed

## 2022-01-07 NOTE — Patient Instructions (Signed)
[facility]  Discharge Instructions: Thank you for choosing [facility] to provide your oncology and hematology care.   If you have a lab appointment with the [facility], please go directly to the [facility] and check in at the registration area.   Wear comfortable clothing and clothing appropriate for easy access to any Portacath or PICC line.   We strive to give you quality time with your provider. You may need to reschedule your appointment if you arrive late (15 or more minutes).  Arriving late affects you and other patients whose appointments are after yours.  Also, if you miss three or more appointments without notifying the office, you may be dismissed from the clinic at the providers discretion.      For prescription refill requests, have your pharmacy contact our office and allow 72 hours for refills to be completed.    Today you received the following chemotherapy and/or immunotherapy agents: Opdivo(nivolumab) and yervoy (Ipilimumab)   To help prevent nausea and vomiting after your treatment, we encourage you to take your nausea medication as directed.  BELOW ARE SYMPTOMS THAT SHOULD BE REPORTED IMMEDIATELY: *FEVER GREATER THAN 100.4 F (38 C) OR HIGHER *CHILLS OR SWEATING *NAUSEA AND VOMITING THAT IS NOT CONTROLLED WITH YOUR NAUSEA MEDICATION *UNUSUAL SHORTNESS OF BREATH *UNUSUAL BRUISING OR BLEEDING *URINARY PROBLEMS (pain or burning when urinating, or frequent urination) *BOWEL PROBLEMS (unusual diarrhea, constipation, pain near the anus) TENDERNESS IN MOUTH AND THROAT WITH OR WITHOUT PRESENCE OF ULCERS (sore throat, sores in mouth, or a toothache) UNUSUAL RASH, SWELLING OR PAIN  UNUSUAL VAGINAL DISCHARGE OR ITCHING   Items with * indicate a potential emergency and should be followed up as soon as possible or go to the Emergency Department if any problems should occur.  Please show the CHEMOTHERAPY ALERT CARD or  IMMUNOTHERAPY ALERT CARD at check-in to the Emergency Department and triage nurse.  Should you have questions after your visit or need to cancel or reschedule your appointment, please contact [facility]  Dept: [phone]  and follow the prompts.  Office hours are 8:00 a.m. to 4:30 p.m. Monday - Friday. Please note that voicemails left after 4:00 p.m. may not be returned until the following business day.  We are closed weekends and major holidays. You have access to a nurse at all times for urgent questions. Please call the main number to the clinic Dept: [phone] and follow the prompts.   For any non-urgent questions, you may also contact your provider using MyChart. We now offer e-Visits for anyone 83 and older to request care online for non-urgent symptoms. For details visit [email or web address].   Also download the MyChart app! Go to the app store, search "MyChart", open the app, select [facility], and log in with your MyChart username and password.  Due to Covid, a mask is required upon entering the hospital/clinic. If you do not have a mask, one will be given to you upon arrival. For doctor visits, patients may have 1 support person aged 2 or older with them. For treatment visits, patients cannot have anyone with them due to current Covid guidelines and our immunocompromised population.   Nivolumab injection What is this medication? NIVOLUMAB (nye VOL ue mab) is a monoclonal antibody. It treats certain types of cancer. Some of the cancers treated are colon cancer, head and neck cancer, Hodgkin lymphoma, lung cancer, and melanoma. This medicine may be used for other purposes; ask your  health care provider or pharmacist if you have questions. COMMON BRAND NAME(S): Opdivo What should I tell my care team before I take this medication? They need to know if you have any of these conditions: Autoimmune diseases such as Crohn's disease, ulcerative colitis, or  lupus Have had or planning to have an allogeneic stem cell transplant (uses someone else's stem cells) History of chest radiation Organ transplant Nervous system problems such as myasthenia gravis or Guillain-Barre syndrome An unusual or allergic reaction to nivolumab, other medicines, foods, dyes, or preservatives Pregnant or trying to get pregnant Breast-feeding How should I use this medication? This medication is injected into a vein. It is given in a hospital or clinic setting. A special MedGuide will be given to you before each treatment. Be sure to read this information carefully each time. Talk to your care team regarding the use of this medication in children. While it may be prescribed for children as young as 12 years for selected conditions, precautions do apply. Overdosage: If you think you have taken too much of this medicine contact a poison control center or emergency room at once. NOTE: This medicine is only for you. Do not share this medicine with others. What if I miss a dose? Keep appointments for follow-up doses. It is important not to miss your dose. Call your care team if you are unable to keep an appointment. What may interact with this medication? Interactions have not been studied. This list may not describe all possible interactions. Give your health care provider a list of all the medicines, herbs, non-prescription drugs, or dietary supplements you use. Also tell them if you smoke, drink alcohol, or use illegal drugs. Some items may interact with your medicine. What should I watch for while using this medication? Your condition will be monitored carefully while you are receiving this medication. You may need blood work done while you are taking this medication. Do not become pregnant while taking this medication or for 5 months after stopping it. Women should inform their care team if they wish to become pregnant or think they might be pregnant. There is a potential  for serious harm to an unborn child. Talk to your care team for more information. Do not breast-feed an infant while taking this medication or for 5 months after stopping it. What side effects may I notice from receiving this medication? Side effects that you should report to your care team as soon as possible: Allergic reactions--skin rash, itching, hives, swelling of the face, lips, tongue, or throat Bloody or black, tar-like stools Change in vision Chest pain Diarrhea Dry cough, shortness of breath or trouble breathing Eye pain Fast or irregular heartbeat Fever, chills High blood sugar (hyperglycemia)--increased thirst or amount of urine, unusual weakness or fatigue, blurry vision High thyroid levels (hyperthyroidism)--fast or irregular heartbeat, weight loss, excessive sweating or sensitivity to heat, tremors or shaking, anxiety, nervousness, irregular menstrual cycle or spotting Kidney injury--decrease in the amount of urine, swelling of the ankles, hands, or feet Liver injury--right upper belly pain, loss of appetite, nausea, light-colored stool, dark yellow or brown urine, yellowing skin or eyes, unusual weakness or fatigue Low red blood cell count--unusual weakness or fatigue, dizziness, headache, trouble breathing Low thyroid levels (hypothyroidism)--unusual weakness or fatigue, increased sensitivity to cold, constipation, hair loss, dry skin, weight gain, feelings of depression Mood and behavior changes-confusion, change in sex drive or performance, irritability Muscle pain or cramps Pain, tingling, or numbness in the hands or feet, muscle weakness, trouble  walking, loss of balance or coordination Red or dark brown urine Redness, blistering, peeling, or loosening of the skin, including inside the mouth Stomach pain Unusual bruising or bleeding Side effects that usually do not require medical attention (report to your care team if they continue or are bothersome): Bone  pain Constipation Loss of appetite Nausea Tiredness Vomiting This list may not describe all possible side effects. Call your doctor for medical advice about side effects. You may report side effects to FDA at 1-800-FDA-1088. Where should I keep my medication? This medication is given in a hospital or clinic and will not be stored at home. NOTE: This sheet is a summary. It may not cover all possible information. If you have questions about this medicine, talk to your doctor, pharmacist, or health care provider.  2022 Elsevier/Gold Standard (2021-08-18 00:00:00)  Ipilimumab injection What is this medication? IPILIMUMAB (IP i LIM ue mab) is a monoclonal antibody. It treats colorectal cancer, esophageal cancer, kidney cancer, liver cancer, lung cancer, melanoma, and mesothelioma. This medicine may be used for other purposes; ask your health care provider or pharmacist if you have questions. COMMON BRAND NAME(S): YERVOY What should I tell my care team before I take this medication? They need to know if you have any of these conditions: autoimmune diseases like Crohn's disease, ulcerative colitis, or lupus have had or planning to have an allogeneic stem cell transplant (uses someone else's stem cells) history of organ transplant nervous system problems like myasthenia gravis or Guillain-Barre syndrome an unusual or allergic reaction to ipilimumab, other medicines, foods, dyes, or preservatives pregnant or trying to get pregnant breast-feeding How should I use this medication? This medicine is for infusion into a vein. It is given by a health care professional in a hospital or clinic setting. A special MedGuide will be given to you before each treatment. Be sure to read this information carefully each time. Talk to your pediatrician regarding the use of this medicine in children. While this drug may be prescribed for children as young as 12 years for selected conditions, precautions do  apply. Overdosage: If you think you have taken too much of this medicine contact a poison control center or emergency room at once. NOTE: This medicine is only for you. Do not share this medicine with others. What if I miss a dose? It is important not to miss your dose. Call your doctor or health care professional if you are unable to keep an appointment. What may interact with this medication? Interactions are not expected. This list may not describe all possible interactions. Give your health care provider a list of all the medicines, herbs, non-prescription drugs, or dietary supplements you use. Also tell them if you smoke, drink alcohol, or use illegal drugs. Some items may interact with your medicine. What should I watch for while using this medication? Tell your doctor or healthcare professional if your symptoms do not start to get better or if they get worse. Do not become pregnant while taking this medicine or for 3 months after stopping it. Women should inform their doctor if they wish to become pregnant or think they might be pregnant. There is a potential for serious side effects to an unborn child. Talk to your health care professional or pharmacist for more information. Do not breast-feed an infant while taking this medicine or for 3 months after the last dose. Your condition will be monitored carefully while you are receiving this medicine. You may need blood work done while  you are taking this medicine. What side effects may I notice from receiving this medication? Side effects that you should report to your doctor or health care professional as soon as possible: allergic reactions like skin rash, itching or hives, swelling of the face, lips, or tongue black, tarry stools bloody or watery diarrhea changes in vision dizziness eye pain fast, irregular heartbeat feeling anxious feeling faint or lightheaded, falls nausea, vomiting pain, tingling, numbness in the hands or  feet redness, blistering, peeling or loosening of the skin, including inside the mouth signs and symptoms of liver injury like dark yellow or brown urine; general ill feeling or flu-like symptoms; light-colored stools; loss of appetite; nausea; right upper belly pain; unusually weak or tired; yellowing of the eyes or skin unusual bleeding or bruising Side effects that usually do not require medical attention (report to your doctor or health care professional if they continue or are bothersome): headache loss of appetite trouble sleeping This list may not describe all possible side effects. Call your doctor for medical advice about side effects. You may report side effects to FDA at 1-800-FDA-1088. Where should I keep my medication? This drug is given in a hospital or clinic and will not be stored at home. NOTE: This sheet is a summary. It may not cover all possible information. If you have questions about this medicine, talk to your doctor, pharmacist, or health care provider.  2022 Elsevier/Gold Standard (2021-08-18 00:00:00)

## 2022-01-08 ENCOUNTER — Ambulatory Visit: Payer: Medicare Other

## 2022-01-11 ENCOUNTER — Telehealth: Payer: Self-pay | Admitting: Medical Oncology

## 2022-01-11 ENCOUNTER — Ambulatory Visit: Payer: Medicare Other

## 2022-01-11 NOTE — Telephone Encounter (Signed)
° °  Sunday - pt had tachycardia " with range 65 -148".  Heart rate today "118".  Friday-fever of  101.3 went up to 102 f. He took tylenol 650 mg ( per on call recommendation) and temp went down to 98.9.     Ipilimumab (YERVOY) Patient Monitoring Assessment    Is the patient experiencing any of the following general symptoms?:  [] Difficulty performing normal activities [] Feeling sluggish or cold all the time [] Unusual weight gain [] Constant or unusual headaches [] Feeling dizzy or faint [] Changes in eyesight (blurry vision, double vision, or other vision problems) [] Changes in mood or behavior (ex: decreased sex drive, irritability, or forgetfulness) [] Starting new medications (ex: steroids, other medications that lower immune response) [x] Patient is not experiencing any of the general symptoms above.     Gastrointestinal  Patient is having 1 bowel movements each day.  Is this different from baseline? [] Yes [x] No Are your stools watery or do they have a foul smell? [x] Yes [] No  Pt reports watery stools sometimes.   Have you seen blood in your stools? [] Yes [x] No Are your stools dark, tarry, or sticky? [] Yes [x] No Are you having pain or tenderness in your belly? [] Yes [x] No   Skin Does your skin itch? [x] Yes [] No Do you have a rash? [x] Yes [] No " I have the remnants of a rash".  Has your skin blistered and/or peeled? [] Yes [x] No Do you have sores in your mouth? [] Yes [x] No   Hepatic Has your urine been dark or tea colored? [] Yes [x] No Have you noticed that your skin or the whites of your eyes are turning yellow? [] Yes [x] No Are you bleeding or bruising more easily than normal? [] Yes [x] No Are you nauseous and/or vomiting? [] Yes [x] No Do you have pain on the right side of your stomach? [] Yes [x] No   Neurologic  Are you having unusual weakness of legs, arms, or face? [] Yes [x] No  " I have had weakness for a while"  Are you having numbness or tingling in your hands or  feet? [] Yes [x] No

## 2022-01-11 NOTE — Telephone Encounter (Signed)
Per Dr [name] , I instructed pt  to go to ED if heart rate =/>140  and or temp =/>100.5 .  Pt voiced understanding.

## 2022-01-15 ENCOUNTER — Emergency Department (HOSPITAL_COMMUNITY): Payer: Medicare Other

## 2022-01-15 ENCOUNTER — Encounter (HOSPITAL_COMMUNITY): Payer: Self-pay

## 2022-01-15 ENCOUNTER — Inpatient Hospital Stay (HOSPITAL_COMMUNITY)
Admission: EM | Admit: 2022-01-15 | Discharge: 2022-01-18 | DRG: 193 | Disposition: A | Discharge status: Home Health | Payer: Medicare Other | Attending: Internal Medicine | Admitting: Internal Medicine

## 2022-01-15 ENCOUNTER — Other Ambulatory Visit: Payer: Self-pay

## 2022-01-15 DIAGNOSIS — J9601 Acute respiratory failure with hypoxia: Secondary | ICD-10-CM | POA: Diagnosis present

## 2022-01-15 DIAGNOSIS — C7800 Secondary malignant neoplasm of unspecified lung: Secondary | ICD-10-CM | POA: Diagnosis not present

## 2022-01-15 DIAGNOSIS — I4891 Unspecified atrial fibrillation: Secondary | ICD-10-CM | POA: Diagnosis not present

## 2022-01-15 DIAGNOSIS — Z8042 Family history of malignant neoplasm of prostate: Secondary | ICD-10-CM

## 2022-01-15 DIAGNOSIS — Z79899 Other long term (current) drug therapy: Secondary | ICD-10-CM | POA: Diagnosis not present

## 2022-01-15 DIAGNOSIS — Z7984 Long term (current) use of oral hypoglycemic drugs: Secondary | ICD-10-CM | POA: Diagnosis not present

## 2022-01-15 DIAGNOSIS — Z8249 Family history of ischemic heart disease and other diseases of the circulatory system: Secondary | ICD-10-CM

## 2022-01-15 DIAGNOSIS — R0602 Shortness of breath: Secondary | ICD-10-CM | POA: Diagnosis present

## 2022-01-15 DIAGNOSIS — C439 Malignant melanoma of skin, unspecified: Secondary | ICD-10-CM | POA: Diagnosis present

## 2022-01-15 DIAGNOSIS — E871 Hypo-osmolality and hyponatremia: Secondary | ICD-10-CM | POA: Diagnosis present

## 2022-01-15 DIAGNOSIS — Z7982 Long term (current) use of aspirin: Secondary | ICD-10-CM | POA: Diagnosis not present

## 2022-01-15 DIAGNOSIS — L8990 Pressure ulcer of unspecified site, unspecified stage: Secondary | ICD-10-CM | POA: Insufficient documentation

## 2022-01-15 DIAGNOSIS — E119 Type 2 diabetes mellitus without complications: Secondary | ICD-10-CM

## 2022-01-15 DIAGNOSIS — I480 Paroxysmal atrial fibrillation: Secondary | ICD-10-CM | POA: Diagnosis present

## 2022-01-15 DIAGNOSIS — C7801 Secondary malignant neoplasm of right lung: Secondary | ICD-10-CM | POA: Diagnosis present

## 2022-01-15 DIAGNOSIS — Z87891 Personal history of nicotine dependence: Secondary | ICD-10-CM | POA: Diagnosis not present

## 2022-01-15 DIAGNOSIS — Z833 Family history of diabetes mellitus: Secondary | ICD-10-CM | POA: Diagnosis not present

## 2022-01-15 DIAGNOSIS — Z823 Family history of stroke: Secondary | ICD-10-CM | POA: Diagnosis not present

## 2022-01-15 DIAGNOSIS — J189 Pneumonia, unspecified organism: Principal | ICD-10-CM | POA: Diagnosis present

## 2022-01-15 DIAGNOSIS — Z83438 Family history of other disorder of lipoprotein metabolism and other lipidemia: Secondary | ICD-10-CM | POA: Diagnosis not present

## 2022-01-15 DIAGNOSIS — Z66 Do not resuscitate: Secondary | ICD-10-CM | POA: Diagnosis present

## 2022-01-15 DIAGNOSIS — L89152 Pressure ulcer of sacral region, stage 2: Secondary | ICD-10-CM | POA: Diagnosis present

## 2022-01-15 DIAGNOSIS — E872 Acidosis, unspecified: Secondary | ICD-10-CM | POA: Diagnosis present

## 2022-01-15 DIAGNOSIS — Z20822 Contact with and (suspected) exposure to covid-19: Secondary | ICD-10-CM | POA: Diagnosis present

## 2022-01-15 DIAGNOSIS — Z9222 Personal history of monoclonal drug therapy: Secondary | ICD-10-CM

## 2022-01-15 DIAGNOSIS — C3490 Malignant neoplasm of unspecified part of unspecified bronchus or lung: Secondary | ICD-10-CM

## 2022-01-15 LAB — CBC WITH DIFFERENTIAL/PLATELET
Abs Immature Granulocytes: 0.1 10*3/uL — ABNORMAL HIGH (ref 0.00–0.07)
Basophils Absolute: 0 10*3/uL (ref 0.0–0.1)
Basophils Relative: 0 %
Eosinophils Absolute: 0.1 10*3/uL (ref 0.0–0.5)
Eosinophils Relative: 1 %
HCT: 30.4 % — ABNORMAL LOW (ref 39.0–52.0)
Hemoglobin: 10.3 g/dL — ABNORMAL LOW (ref 13.0–17.0)
Immature Granulocytes: 1 %
Lymphocytes Relative: 3 %
Lymphs Abs: 0.3 10*3/uL — ABNORMAL LOW (ref 0.7–4.0)
MCH: 31.4 pg (ref 26.0–34.0)
MCHC: 33.9 g/dL (ref 30.0–36.0)
MCV: 92.7 fL (ref 80.0–100.0)
Monocytes Absolute: 0.4 10*3/uL (ref 0.1–1.0)
Monocytes Relative: 3 %
Neutro Abs: 9.7 10*3/uL — ABNORMAL HIGH (ref 1.7–7.7)
Neutrophils Relative %: 92 %
Platelets: 310 10*3/uL (ref 150–400)
RBC: 3.28 MIL/uL — ABNORMAL LOW (ref 4.22–5.81)
RDW: 13.3 % (ref 11.5–15.5)
WBC: 10.5 10*3/uL (ref 4.0–10.5)
nRBC: 0 % (ref 0.0–0.2)

## 2022-01-15 LAB — COMPREHENSIVE METABOLIC PANEL
ALT: 52 U/L — ABNORMAL HIGH (ref 0–44)
AST: 42 U/L — ABNORMAL HIGH (ref 15–41)
Albumin: 2.8 g/dL — ABNORMAL LOW (ref 3.5–5.0)
Alkaline Phosphatase: 59 U/L (ref 38–126)
Anion gap: 13 (ref 5–15)
BUN: 18 mg/dL (ref 8–23)
CO2: 21 mmol/L — ABNORMAL LOW (ref 22–32)
Calcium: 10.2 mg/dL (ref 8.9–10.3)
Chloride: 92 mmol/L — ABNORMAL LOW (ref 98–111)
Creatinine, Ser: 0.95 mg/dL (ref 0.61–1.24)
GFR, Estimated: >60 mL/min (ref >60)
Glucose, Bld: 184 mg/dL — ABNORMAL HIGH (ref 70–99)
Potassium: 4.1 mmol/L (ref 3.5–5.1)
Sodium: 126 mmol/L — ABNORMAL LOW (ref 135–145)
Total Bilirubin: 0.7 mg/dL (ref 0.3–1.2)
Total Protein: 7.6 g/dL (ref 6.5–8.1)

## 2022-01-15 LAB — BRAIN NATRIURETIC PEPTIDE: B Natriuretic Peptide: 101 pg/mL — ABNORMAL HIGH (ref 0.0–100.0)

## 2022-01-15 LAB — LACTIC ACID, PLASMA
Lactic Acid, Venous: 2.4 mmol/L (ref 0.5–1.9)
Lactic Acid, Venous: 2.7 mmol/L (ref 0.5–1.9)
Lactic Acid, Venous: 1.8 mmol/L (ref 0.5–1.9)

## 2022-01-15 LAB — APTT: aPTT: 31 seconds (ref 24–36)

## 2022-01-15 LAB — RESP PANEL BY RT-PCR (FLU A&B, COVID) ARPGX2
Influenza A by PCR: NEGATIVE
Influenza B by PCR: NEGATIVE
SARS Coronavirus 2 by RT PCR: NEGATIVE

## 2022-01-15 LAB — URINALYSIS, ROUTINE W REFLEX MICROSCOPIC
Bacteria, UA: NONE SEEN
Bilirubin Urine: NEGATIVE
Glucose, UA: NEGATIVE mg/dL
Hgb urine dipstick: NEGATIVE
Ketones, ur: 20 mg/dL — AB
Leukocytes,Ua: NEGATIVE
Nitrite: NEGATIVE
Protein, ur: 30 mg/dL — AB
Specific Gravity, Urine: 1.01 (ref 1.005–1.030)
pH: 6 (ref 5.0–8.0)

## 2022-01-15 LAB — TROPONIN I (HIGH SENSITIVITY)
Troponin I (High Sensitivity): 29 ng/L — ABNORMAL HIGH (ref <18)
Troponin I (High Sensitivity): 26 ng/L — ABNORMAL HIGH (ref <18)

## 2022-01-15 LAB — OSMOLALITY, URINE: Osmolality, Ur: 610 mOsm/kg (ref 300–900)

## 2022-01-15 LAB — PROTIME-INR
INR: 1.2 (ref 0.8–1.2)
Prothrombin Time: 15.1 seconds (ref 11.4–15.2)

## 2022-01-15 LAB — CBG MONITORING, ED: Glucose-Capillary: 280 mg/dL — ABNORMAL HIGH (ref 70–99)

## 2022-01-15 LAB — SODIUM, URINE, RANDOM: Sodium, Ur: 50 mmol/L

## 2022-01-15 LAB — HEMOGLOBIN A1C
Hgb A1c MFr Bld: 7.3 % — ABNORMAL HIGH (ref 4.8–5.6)
Mean Plasma Glucose: 162.81 mg/dL

## 2022-01-15 MED ORDER — PROCHLORPERAZINE MALEATE 10 MG PO TABS: 10.0000 mg | ORAL_TABLET | Freq: Four times a day (QID) | ORAL | Status: DC | PRN

## 2022-01-15 MED ORDER — DILTIAZEM HCL 25 MG/5ML IV SOLN
10.0000 mg | Freq: Once | INTRAVENOUS | Status: AC
  Administered 2022-01-15: 10 mg via INTRAVENOUS
  Filled 2022-01-15: qty 5

## 2022-01-15 MED ORDER — CARBOXYMETHYLCELLUL-GLYCERIN 0.5-0.9 % OP SOLN: 1.0000 [drp] | Freq: Every day | OPHTHALMIC | Status: DC | PRN

## 2022-01-15 MED ORDER — SODIUM CHLORIDE 0.9 % IV SOLN
1.0000 g | Freq: Once | INTRAVENOUS | Status: AC
  Administered 2022-01-15: 1 g via INTRAVENOUS
  Filled 2022-01-15: qty 10

## 2022-01-15 MED ORDER — DILTIAZEM HCL-DEXTROSE 125-5 MG/125ML-% IV SOLN (PREMIX)
5.0000 mg/h | INTRAVENOUS | Status: DC
  Administered 2022-01-15: 15 mg/h via INTRAVENOUS
  Administered 2022-01-15: 5 mg/h via INTRAVENOUS
  Filled 2022-01-15 (×2): qty 125

## 2022-01-15 MED ORDER — ENOXAPARIN SODIUM 80 MG/0.8ML IJ SOSY
1.0000 mg/kg | PREFILLED_SYRINGE | Freq: Two times a day (BID) | INTRAMUSCULAR | Status: DC
  Administered 2022-01-16: 67.5 mg via SUBCUTANEOUS
  Filled 2022-01-15: qty 0.8

## 2022-01-15 MED ORDER — SIMVASTATIN 40 MG PO TABS
40.0000 mg | ORAL_TABLET | Freq: Every day | ORAL | Status: DC
  Administered 2022-01-15: 40 mg via ORAL
  Filled 2022-01-15: qty 2

## 2022-01-15 MED ORDER — ASPIRIN EC 81 MG PO TBEC
81.0000 mg | DELAYED_RELEASE_TABLET | Freq: Every evening | ORAL | Status: DC
  Administered 2022-01-15 – 2022-01-17 (×3): 81 mg via ORAL
  Filled 2022-01-15 (×3): qty 1

## 2022-01-15 MED ORDER — ALBUTEROL SULFATE HFA 108 (90 BASE) MCG/ACT IN AERS
2.0000 | INHALATION_SPRAY | RESPIRATORY_TRACT | Status: DC | PRN
  Filled 2022-01-15: qty 6.7

## 2022-01-15 MED ORDER — ENOXAPARIN SODIUM 80 MG/0.8ML IJ SOSY
1.0000 mg/kg | PREFILLED_SYRINGE | Freq: Once | INTRAMUSCULAR | Status: AC
  Administered 2022-01-15: 67.5 mg via SUBCUTANEOUS
  Filled 2022-01-15: qty 0.68

## 2022-01-15 MED ORDER — SODIUM CHLORIDE (PF) 0.9 % IJ SOLN
INTRAMUSCULAR | Status: AC
  Filled 2022-01-15: qty 50

## 2022-01-15 MED ORDER — SODIUM CHLORIDE 0.9 % IV SOLN
500.0000 mg | INTRAVENOUS | Status: DC
  Administered 2022-01-16: 500 mg via INTRAVENOUS
  Filled 2022-01-15: qty 5

## 2022-01-15 MED ORDER — ACETAMINOPHEN 325 MG PO TABS: 650.0000 mg | ORAL_TABLET | Freq: Four times a day (QID) | ORAL | Status: DC | PRN

## 2022-01-15 MED ORDER — SODIUM CHLORIDE 0.9 % IV SOLN: INTRAVENOUS | Status: DC

## 2022-01-15 MED ORDER — ACETAMINOPHEN 650 MG RE SUPP: 650.0000 mg | Freq: Four times a day (QID) | RECTAL | Status: DC | PRN

## 2022-01-15 MED ORDER — ZINC SULFATE 220 (50 ZN) MG PO CAPS
220.0000 mg | ORAL_CAPSULE | Freq: Every day | ORAL | Status: DC
  Administered 2022-01-15 – 2022-01-18 (×4): 220 mg via ORAL
  Filled 2022-01-15 (×4): qty 1

## 2022-01-15 MED ORDER — INSULIN ASPART 100 UNIT/ML IJ SOLN
0.0000 [IU] | Freq: Three times a day (TID) | INTRAMUSCULAR | Status: DC
  Administered 2022-01-15: 5 [IU] via SUBCUTANEOUS
  Administered 2022-01-16: 3 [IU] via SUBCUTANEOUS
  Administered 2022-01-16: 2 [IU] via SUBCUTANEOUS
  Administered 2022-01-16: 5 [IU] via SUBCUTANEOUS
  Administered 2022-01-17: 2 [IU] via SUBCUTANEOUS
  Administered 2022-01-17 (×2): 3 [IU] via SUBCUTANEOUS
  Administered 2022-01-18: 5 [IU] via SUBCUTANEOUS
  Administered 2022-01-18: 2 [IU] via SUBCUTANEOUS
  Filled 2022-01-15: qty 0.09

## 2022-01-15 MED ORDER — DILTIAZEM HCL 60 MG PO TABS
60.0000 mg | ORAL_TABLET | Freq: Three times a day (TID) | ORAL | Status: DC
  Administered 2022-01-16 (×2): 60 mg via ORAL
  Filled 2022-01-15 (×2): qty 1

## 2022-01-15 MED ORDER — DEXTROMETHORPHAN POLISTIREX ER 30 MG/5ML PO SUER
30.0000 mg | Freq: Two times a day (BID) | ORAL | Status: DC | PRN
  Administered 2022-01-16: 30 mg via ORAL
  Filled 2022-01-15 (×4): qty 5

## 2022-01-15 MED ORDER — SODIUM CHLORIDE 0.9 % IV SOLN
2.0000 g | INTRAVENOUS | Status: DC
  Administered 2022-01-16 – 2022-01-18 (×3): 2 g via INTRAVENOUS
  Filled 2022-01-15 (×3): qty 20

## 2022-01-15 MED ORDER — LACTATED RINGERS IV SOLN: INTRAVENOUS | Status: DC

## 2022-01-15 MED ORDER — SODIUM CHLORIDE 0.9 % IV SOLN
500.0000 mg | Freq: Once | INTRAVENOUS | Status: AC
  Administered 2022-01-15: 500 mg via INTRAVENOUS
  Filled 2022-01-15: qty 5

## 2022-01-15 MED ORDER — IOHEXOL 350 MG/ML SOLN
100.0000 mL | Freq: Once | INTRAVENOUS | Status: AC | PRN
  Administered 2022-01-15: 100 mL via INTRAVENOUS

## 2022-01-15 MED ORDER — LACTATED RINGERS IV BOLUS
1000.0000 mL | Freq: Once | INTRAVENOUS | Status: AC
  Administered 2022-01-15: 1000 mL via INTRAVENOUS
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
## 2022-01-15 NOTE — ED Notes (Signed)
Patient can stop IV Cardizem 1hr after oral Cardizem given per Dr. Linda Hedges

## 2022-01-15 NOTE — ED Triage Notes (Signed)
Patient's wife reports that the patient began having a productive cough with yellow sputum and SOB. Patient has lung cancer. Patient also c/o weakness.

## 2022-01-15 NOTE — ED Notes (Signed)
Patient transported to CT 

## 2022-01-15 NOTE — ED Provider Notes (Signed)
[facility] DEPT Provider Note   CSN: [id number] Arrival date & time: 01/15/22  4967     History  Chief Complaint  Patient presents with   Shortness of Breath   Cough    [name] [name] is a 76 y.o. male.  He is brought in by home by his wife for evaluation of cough shortness of breath chest discomfort and generalized weakness that started around 4 AM this morning.  Cough productive of yellow sputum.  Generalized chest discomfort.  Vomited x1, he says he thinks it is related to his cough.  No hemoptysis no fever.  No abdominal pain or diarrhea.  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  He is prior received radiation and is currently on 2 monoclonal antibody therapies.  The history is provided by the patient and the spouse.  Shortness of Breath Severity:  Moderate Onset quality:  Sudden Duration:  5 hours Timing:  Constant Progression:  Unchanged Chronicity:  New Relieved by:  Nothing Worsened by:  Activity and coughing Ineffective treatments:  None tried Associated symptoms: chest pain, cough, sputum production and vomiting   Associated symptoms: no abdominal pain, no fever, no headaches, no hemoptysis, no neck pain, no rash and no sore throat   Cough Cough characteristics:  Productive Sputum characteristics:  Yellow Onset quality:  Gradual Timing:  Intermittent Progression:  Unchanged Chronicity:  New Smoker: no   Associated symptoms: chest pain and shortness of breath   Associated symptoms: no fever, no headaches, no rash and no sore throat       Home Medications Prior to Admission medications   Medication Sig Start Date End Date Taking? Authorizing Provider  ACCU-CHEK SOFTCLIX LANCETS lancets by Other route. Use as instructed    [provider]  acetaminophen (TYLENOL) 500 MG tablet Take 1,000 mg by mouth every 6 (six) hours as needed for moderate pain.    [provider]  aspirin EC 81  MG tablet Take 1 tablet (81 mg total) by mouth every evening. 11/23/21   Collene Gobble, MD  aspirin-sod bicarb-citric acid (ALKA-SELTZER) 325 MG TBEF tablet Take 650 mg by mouth every 6 (six) hours as needed (indigestion).    [provider]  Blood Glucose Monitoring Suppl (ACCU-CHEK AVIVA PLUS) w/Device KIT by Does not apply route.    [provider]  Carboxymethylcellul-Glycerin (LUBRICATING EYE DROPS OP) Place 1 drop into both eyes daily as needed (dry eyes).    [provider]  Cyanocobalamin (B-12) 2500 MCG TABS Take 2,500 mcg by mouth once a week.    [provider]  dextromethorphan (DELSYM) 30 MG/5ML liquid Take 30 mg by mouth 2 (two) times daily as needed for cough.    [provider]  dextromethorphan-guaiFENesin (MUCINEX DM) 30-600 MG 12hr tablet Take 1 tablet by mouth 2 (two) times daily as needed (congestion).    [provider]  DM-APAP-CPM (CORICIDIN HBP PO) Take 1 tablet by mouth daily as needed (congestion).    [provider]  glimepiride (AMARYL) 4 MG tablet Take 4 mg by mouth daily. 11/05/19   [provider]  glucose blood test strip 1 each by Other route as needed for other. Use as instructed    [provider]  hydrocortisone 2.5 % cream Apply topically 2 (two) times daily. 12/28/21   Heilingoetter, Cassandra L, PA-C  metFORMIN (GLUCOPHAGE) 500 MG tablet Take 1,000 mg by mouth 2 (two) times daily. 09/24/19   [provider]  mirtazapine (REMERON) 7.5 MG tablet Take 1 tablet (7.5 mg total) by mouth at bedtime. 12/28/21   Heilingoetter, Cassandra L, PA-C  prochlorperazine (COMPAZINE) 10 MG tablet Take 1 tablet (10 mg total) by mouth every 6 (six) hours as needed. 11/30/21   Heilingoetter, Cassandra L, PA-C  simvastatin (ZOCOR) 40 MG tablet Take 40 mg by mouth daily at 6 PM.    [provider]  zinc gluconate 50 MG tablet Take 50 mg by mouth daily.    [provider]       Allergies    Patient has no known allergies.    Review of Systems   Review of Systems  Constitutional:  Negative for fever.  HENT:  Negative for sore throat.   Eyes:  Negative for visual disturbance.  Respiratory:  Positive for cough, sputum production and shortness of breath. Negative for hemoptysis.   Cardiovascular:  Positive for chest pain.  Gastrointestinal:  Positive for vomiting. Negative for abdominal pain, diarrhea and nausea.  Genitourinary:  Negative for dysuria.  Musculoskeletal:  Negative for neck pain.  Skin:  Negative for rash.  Neurological:  Negative for headaches.   Physical Exam Updated Vital Signs BP (!) 163/80 (BP Location: Left Arm)    Pulse (!) 124    Temp 99.1 F (37.3 C) (Oral)    Resp 19    Ht '5\' 11"'  (1.803 m)    Wt 67.2 kg    SpO2 94%    BMI 20.67 kg/m  Physical Exam Vitals and nursing note reviewed.  Constitutional:      General: He is not in acute distress.    Appearance: He is well-developed.  HENT:     Head: Normocephalic and atraumatic.  Eyes:     Conjunctiva/sclera: Conjunctivae normal.  Cardiovascular:     Rate and Rhythm: Tachycardia present. Rhythm irregular.     Heart sounds: No murmur heard. Pulmonary:     Effort: Pulmonary effort is normal. Tachypnea present. No respiratory distress.     Breath sounds: Normal breath sounds.  Abdominal:     Palpations: Abdomen is soft.     Tenderness: There is no abdominal tenderness.  Musculoskeletal:        General: No swelling. Normal range of motion.     Cervical back: Neck supple.     Right lower leg: No tenderness. No edema.     Left lower leg: No tenderness. No edema.  Skin:    General: Skin is warm and dry.     Capillary Refill: Capillary refill takes less than 2 seconds.  Neurological:     General: No focal deficit present.     Mental Status: He is alert.    ED Results / Procedures / Treatments   Labs (all labs ordered are listed, but only abnormal results are displayed) Labs  Reviewed  LACTIC ACID, PLASMA - Abnormal; Notable for the following components:      Result Value   Lactic Acid, Venous 2.4 (*)    All other components within normal limits  LACTIC ACID, PLASMA - Abnormal; Notable for the following components:   Lactic Acid, Venous 2.7 (*)    All other components within normal limits  COMPREHENSIVE METABOLIC PANEL - Abnormal; Notable for the following components:   Sodium 126 (*)    Chloride 92 (*)    CO2 21 (*)    Glucose, Bld 184 (*)    Albumin 2.8 (*)    AST 42 (*)    ALT  52 (*)    All other components within normal limits  CBC WITH DIFFERENTIAL/PLATELET - Abnormal; Notable for the following components:   RBC 3.28 (*)    Hemoglobin 10.3 (*)    HCT 30.4 (*)    Neutro Abs 9.7 (*)    Lymphs Abs 0.3 (*)    Abs Immature Granulocytes 0.10 (*)    All other components within normal limits  URINALYSIS, ROUTINE W REFLEX MICROSCOPIC - Abnormal; Notable for the following components:   Ketones, ur 20 (*)    Protein, ur 30 (*)    All other components within normal limits  BRAIN NATRIURETIC PEPTIDE - Abnormal; Notable for the following components:   B Natriuretic Peptide 101.0 (*)    All other components within normal limits  CBG MONITORING, ED - Abnormal; Notable for the following components:   Glucose-Capillary 280 (*)    All other components within normal limits  TROPONIN I (HIGH SENSITIVITY) - Abnormal; Notable for the following components:   Troponin I (High Sensitivity) 29 (*)    All other components within normal limits  TROPONIN I (HIGH SENSITIVITY) - Abnormal; Notable for the following components:   Troponin I (High Sensitivity) 26 (*)    All other components within normal limits  RESP PANEL BY RT-PCR (FLU A&B, COVID) ARPGX2  CULTURE, BLOOD (ROUTINE X 2)  CULTURE, BLOOD (ROUTINE X 2)  PROTIME-INR  APTT  LACTIC ACID, PLASMA  SODIUM, URINE, RANDOM  HEMOGLOBIN A1C  LACTIC ACID, PLASMA  OSMOLALITY, URINE  PROTIME-INR  CORTISOL-AM, BLOOD   PROCALCITONIN    EKG EKG Interpretation  Date/Time:  Friday January 15 2022 08:35:26 EST Ventricular Rate:  119 PR Interval:    QRS Duration: 74 QT Interval:  292 QTC Calculation: 411 R Axis:   75 Text Interpretation: Atrial fibrillation Borderline ST depression, diffuse leads afib replacing sinus on prior 11/22 Confirmed by Aletta Edouard [phone]) on 01/15/2022 8:59:09 AM  Radiology CT Angio Chest PE W/Cm &/Or Wo Cm  Result Date: 01/15/2022 CLINICAL DATA:  Productive cough. Pulmonary embolism suspected, high probability. History of lung cancer with radiation therapy. EXAM: CT ANGIOGRAPHY CHEST WITH CONTRAST TECHNIQUE: Multidetector CT imaging of the chest was performed using the standard protocol during bolus administration of intravenous contrast. Multiplanar CT image reconstructions and MIPs were obtained to evaluate the vascular anatomy. RADIATION DOSE REDUCTION: This exam was performed according to the departmental dose-optimization program which includes automated exposure control, adjustment of the mA and/or kV according to patient size and/or use of iterative reconstruction technique. CONTRAST:  168m OMNIPAQUE IOHEXOL 350 MG/ML SOLN COMPARISON:  Chest CT 10/28/2021.  PET-CT 11/04/2021. FINDINGS: Cardiovascular: The pulmonary arteries are well opacified with contrast to the level of the subsegmental branches. There is no evidence of acute pulmonary embolism. Chronic extrinsic narrowing of the right upper lobe pulmonary artery by the right hilar mass. Diffuse atherosclerosis of the aorta, great vessels and coronary arteries, without acute vascular findings. The heart size is normal. There is no pericardial effusion. Mediastinum/Nodes: The previously demonstrated bilateral hilar masses appear improved, consistent with response to therapy. Anterior right suprahilar mass measures approximately 5.9 x 3.7 cm on image 71/4 (previously 6.4 x 5.5 cm). Posterior left hilar mass measures approximately  2.4 x 2.1 cm on image 99/4 (previously 2.8 x 2.4 cm). No enlarged mediastinal or hilar lymph nodes identified. The thyroid gland, trachea and esophagus demonstrate no significant findings. Lungs/Pleura: Small pleural effusion has decreased in volume. As above, the central right upper lobe perihilar mass has decreased  in size with improving patency of the right upper lobe bronchus. Right middle lobe mass measures up to 5.4 cm on sagittal image 45/8 (previously 5.6 cm). There are persistent patchy airspace opacities in the right upper lobe which likely relate to posterior obstructive pneumonitis. Some of these have partially cleared. However, there is interval increased interstitial and central airway thickening throughout both lungs which may be treatment related or due to superimposed edema or atypical inflammation. Underlying moderate centrilobular and paraseptal emphysema noted. Upper abdomen: No significant findings are seen within the visualized upper abdomen. Musculoskeletal/Chest wall: There is no chest wall mass or suspicious osseous finding. Review of the MIP images confirms the above findings. IMPRESSION: 1. No evidence of acute pulmonary embolism or other acute vascular findings in the chest. 2. 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 No progressive disease identified. 3. Although the posterior obstructive changes in the right upper lobe are generally improved, there is increased interstitial and central airway thickening throughout both lungs which may be treatment related or due to superimposed edema or inflammation. Small right pleural effusion has improved. 4. Aortic Atherosclerosis (ICD10-I70.0) and Emphysema (ICD10-J43.9). Electronically Signed   By: Richardean Sale M.D.   On: 01/15/2022 10:50   DG Chest Port 1 View  Result Date: 01/15/2022 CLINICAL DATA:  76 year old male with  non-small cell lung cancer. Possible sepsis. EXAM: PORTABLE CHEST 1 VIEW COMPARISON:  Portable chest 11/23/2021 and earlier. FINDINGS: Portable AP semi upright view at 0918 hours. Right hilar mass and right lower lobe mass demonstrate mild radiographic regression since December. Mildly improved regional ventilation. Other mediastinal contours are within normal limits. No superimposed pneumothorax, pulmonary edema, pleural effusion or new confluent pulmonary opacity. Stable visualized osseous structures. Negative visible bowel gas. IMPRESSION: 1. Right lung cancer with mild radiographic regression since December. 2. No new cardiopulmonary abnormality. Electronically Signed   By: Genevie Ann M.D.   On: 01/15/2022 09:32    Procedures .Critical Care Performed by: Hayden Rasmussen, MD Authorized by: Hayden Rasmussen, MD   Critical care provider statement:    Critical care time (minutes):  45   Critical care time was exclusive of:  Separately billable procedures and treating other patients   Critical care was necessary to treat or prevent imminent or life-threatening deterioration of the following conditions:  Circulatory failure and sepsis   Critical care was time spent personally by me on the following activities:  Development of treatment plan with patient or surrogate, discussions with consultants, evaluation of patient's response to treatment, examination of patient, obtaining history from patient or surrogate, ordering and performing treatments and interventions, ordering and review of laboratory studies, ordering and review of radiographic studies, pulse oximetry, re-evaluation of patient's condition and review of old charts   I assumed direction of critical care for this patient from another provider in my specialty: no      Medications Ordered in ED Medications  albuterol (VENTOLIN HFA) 108 (90 Base) MCG/ACT inhaler 2 puff (has no administration in time range)  diltiazem (CARDIZEM) 125 mg in  dextrose 5% 125 mL (1 mg/mL) infusion (15 mg/hr Intravenous Rate/Dose Change 01/15/22 1433)  insulin aspart (novoLOG) injection 0-9 Units (5 Units Subcutaneous Given 01/15/22 1732)  0.9 %  sodium chloride infusion ( Intravenous New Bag/Given 01/15/22 1429)  enoxaparin (LOVENOX) injection 67.5 mg (has no administration in time range)  simvastatin (ZOCOR) tablet 40 mg (has no administration in time  range)  prochlorperazine (COMPAZINE) tablet 10 mg (has no administration in time range)  zinc sulfate capsule 220 mg (220 mg Oral Given 01/15/22 1619)  dextromethorphan (DELSYM) 30 MG/5ML liquid 30 mg (has no administration in time range)  carboxymethylcellul-glycerin (REFRESH OPTIVE) 0.5-0.9 % ophthalmic solution 1 drop (has no administration in time range)  aspirin EC tablet 81 mg (has no administration in time range)  cefTRIAXone (ROCEPHIN) 2 g in sodium chloride 0.9 % 100 mL IVPB (has no administration in time range)  azithromycin (ZITHROMAX) 500 mg in sodium chloride 0.9 % 250 mL IVPB (has no administration in time range)  acetaminophen (TYLENOL) tablet 650 mg (has no administration in time range)    Or  acetaminophen (TYLENOL) suppository 650 mg (has no administration in time range)  lactated ringers bolus 1,000 mL (0 mLs Intravenous Stopped 01/15/22 1111)  iohexol (OMNIPAQUE) 350 MG/ML injection 100 mL (100 mLs Intravenous Contrast Given 01/15/22 1024)  sodium chloride (PF) 0.9 % injection (  Given by Other 01/15/22 1136)  cefTRIAXone (ROCEPHIN) 1 g in sodium chloride 0.9 % 100 mL IVPB (0 g Intravenous Stopped 01/15/22 1206)  azithromycin (ZITHROMAX) 500 mg in sodium chloride 0.9 % 250 mL IVPB (0 mg Intravenous Stopped 01/15/22 1238)  diltiazem (CARDIZEM) injection 10 mg (10 mg Intravenous Given 01/15/22 1132)  enoxaparin (LOVENOX) injection 67.5 mg (67.5 mg Subcutaneous Given 01/15/22 1429)    ED Course/ Medical Decision Making/ A&P Clinical Course as of 01/15/22 1755  Fri Jan 15, 2022  0900 Per cardiology note  from 2020 had 1 brief episode of A-fib and was on Eliquis but has not been on it for over a year. [MB]  [id number] Chest x-ray interpreted by me as right-sided mediastinal mass and lower lobe infiltrate.  Similar to prior EKG from December.  Awaiting radiology reading. [MB]  1227 Discussed with Dr. Marylyn Ishihara [facility] hospital[facility] who will evaluate the patient for admission. [MB]    Clinical Course User Index [MB] Hayden Rasmussen, MD                           Medical Decision Making Amount and/or Complexity of Data Reviewed Labs: ordered. Radiology: ordered.  Risk Prescription drug management. Decision regarding hospitalization.  Henri Baumler was evaluated in Emergency Department on 01/15/2022 for the symptoms described in the history of present illness. He was evaluated in the context of the global COVID-19 pandemic, which necessitated consideration that the patient might be at risk for infection with the SARS-CoV-2 virus that causes COVID-19. Institutional protocols and algorithms that pertain to the evaluation of patients at risk for COVID-19 are in a state of rapid change based on information released by regulatory bodies including the CDC and federal and state organizations. These policies and algorithms were followed during the patient's care in the ED.  This patient complains of shortness of breath cough; this involves an extensive number of treatment Options and is a complaint that carries with it a high risk of complications and Morbidity. The differential includes pneumonia, COPD, CHF pneumothorax, PE, vascular, arrhythmia  I ordered, reviewed and interpreted labs, which included CBC with normal white count, hemoglobin low stable from priors, chemistries with worsening hyponatremia, low bicarb, mild elevation of AST and ALT, troponins slightly elevated but flat, lactate elevated and rising, blood culture sent, BNP not significantly elevated, COVID and flu negative I ordered medication IV fluids IV  antibiotics IV calcium channel blocker for tachycardia I ordered imaging studies which included  chest x-ray and CT angio chest and I independently    visualized and interpreted imaging which showed metastatic disease no clear infiltrate Additional history obtained from patient's wife Previous records obtained and reviewed in epic including prior admission and clinic visits I consulted [facility] hospitalist Dr. Marylyn Ishihara and discussed lab and imaging findings  Critical Interventions: Work-up of patient's possible septic condition with elevated lactate, early antibiotics and fluids.  Also needed management of patient's A-fib with rapid ventricular response with IV medications  After the interventions stated above, I reevaluated the patient and found patient still to be tachycardic but improving.  He is agreeable to admission for further management of possible infection and needing better control atrial fibrillation. CHA2DS2/VAS Stroke Risk Points  Current as of 5 minutes ago     4 >= 2 Points: High Risk  1 - 1.99 Points: Medium Risk  0 Points: Low Risk    Last Change: N/A      Details    This score determines the patient's risk of having a stroke if the  patient has atrial fibrillation.       Points Metrics  0 Has Congestive Heart Failure:  No    Current as of 5 minutes ago  0 Has Vascular Disease:  No    Current as of 5 minutes ago  1 Has Hypertension:  Yes    Current as of 5 minutes ago  2 Age:  81    Current as of 5 minutes ago  1 Has Diabetes:  Yes    Current as of 5 minutes ago  0 Had Stroke:  No  Had TIA:  No  Had Thromboembolism:  No    Current as of 5 minutes ago  0 Male:  No    Current as of 5 minutes ago                   Final Clinical Impression(s) / ED Diagnoses Final diagnoses:  Shortness of breath  Atrial fibrillation with RVR ([place])  Malignant neoplasm of lung, unspecified laterality, unspecified part of lung ([place])    Rx / DC Orders ED Discharge Orders      None         Hayden Rasmussen, MD 01/15/22 1800

## 2022-01-16 DIAGNOSIS — J189 Pneumonia, unspecified organism: Principal | ICD-10-CM

## 2022-01-16 DIAGNOSIS — E871 Hypo-osmolality and hyponatremia: Secondary | ICD-10-CM

## 2022-01-16 DIAGNOSIS — J9601 Acute respiratory failure with hypoxia: Secondary | ICD-10-CM

## 2022-01-16 LAB — CBC WITH DIFFERENTIAL/PLATELET
Abs Immature Granulocytes: 0.09 10*3/uL — ABNORMAL HIGH (ref 0.00–0.07)
Basophils Absolute: 0 10*3/uL (ref 0.0–0.1)
Basophils Relative: 0 %
Eosinophils Absolute: 0.3 10*3/uL (ref 0.0–0.5)
Eosinophils Relative: 4 %
HCT: 26.2 % — ABNORMAL LOW (ref 39.0–52.0)
Hemoglobin: 8.7 g/dL — ABNORMAL LOW (ref 13.0–17.0)
Immature Granulocytes: 1 %
Lymphocytes Relative: 6 %
Lymphs Abs: 0.4 10*3/uL — ABNORMAL LOW (ref 0.7–4.0)
MCH: 31.2 pg (ref 26.0–34.0)
MCHC: 33.2 g/dL (ref 30.0–36.0)
MCV: 93.9 fL (ref 80.0–100.0)
Monocytes Absolute: 0.7 10*3/uL (ref 0.1–1.0)
Monocytes Relative: 9 %
Neutro Abs: 6 10*3/uL (ref 1.7–7.7)
Neutrophils Relative %: 80 %
Platelets: 259 10*3/uL (ref 150–400)
RBC: 2.79 MIL/uL — ABNORMAL LOW (ref 4.22–5.81)
RDW: 13.4 % (ref 11.5–15.5)
WBC: 7.4 10*3/uL (ref 4.0–10.5)
nRBC: 0 % (ref 0.0–0.2)

## 2022-01-16 LAB — BASIC METABOLIC PANEL
Anion gap: 12 (ref 5–15)
BUN: 24 mg/dL — ABNORMAL HIGH (ref 8–23)
CO2: 19 mmol/L — ABNORMAL LOW (ref 22–32)
Calcium: 9.5 mg/dL (ref 8.9–10.3)
Chloride: 91 mmol/L — ABNORMAL LOW (ref 98–111)
Creatinine, Ser: 0.74 mg/dL (ref 0.61–1.24)
GFR, Estimated: >60 mL/min (ref >60)
Glucose, Bld: 230 mg/dL — ABNORMAL HIGH (ref 70–99)
Potassium: 3.9 mmol/L (ref 3.5–5.1)
Sodium: 122 mmol/L — ABNORMAL LOW (ref 135–145)

## 2022-01-16 LAB — COMPREHENSIVE METABOLIC PANEL
ALT: 35 U/L (ref 0–44)
AST: 23 U/L (ref 15–41)
Albumin: 2.3 g/dL — ABNORMAL LOW (ref 3.5–5.0)
Alkaline Phosphatase: 42 U/L (ref 38–126)
Anion gap: 9 (ref 5–15)
BUN: 16 mg/dL (ref 8–23)
CO2: 18 mmol/L — ABNORMAL LOW (ref 22–32)
Calcium: 8.9 mg/dL (ref 8.9–10.3)
Chloride: 94 mmol/L — ABNORMAL LOW (ref 98–111)
Creatinine, Ser: 0.73 mg/dL (ref 0.61–1.24)
GFR, Estimated: >60 mL/min (ref >60)
Glucose, Bld: 219 mg/dL — ABNORMAL HIGH (ref 70–99)
Potassium: 3.8 mmol/L (ref 3.5–5.1)
Sodium: 121 mmol/L — ABNORMAL LOW (ref 135–145)
Total Bilirubin: 0.5 mg/dL (ref 0.3–1.2)
Total Protein: 6 g/dL — ABNORMAL LOW (ref 6.5–8.1)

## 2022-01-16 LAB — PROCALCITONIN: Procalcitonin: 1.83 ng/mL

## 2022-01-16 LAB — MAGNESIUM: Magnesium: 1.5 mg/dL — ABNORMAL LOW (ref 1.7–2.4)

## 2022-01-16 LAB — GLUCOSE, CAPILLARY
Glucose-Capillary: 196 mg/dL — ABNORMAL HIGH (ref 70–99)
Glucose-Capillary: 198 mg/dL — ABNORMAL HIGH (ref 70–99)
Glucose-Capillary: 229 mg/dL — ABNORMAL HIGH (ref 70–99)
Glucose-Capillary: 249 mg/dL — ABNORMAL HIGH (ref 70–99)
Glucose-Capillary: 270 mg/dL — ABNORMAL HIGH (ref 70–99)

## 2022-01-16 LAB — CORTISOL-AM, BLOOD: Cortisol - AM: 19.5 ug/dL (ref 6.7–22.6)

## 2022-01-16 LAB — PROTIME-INR
INR: 1.3 — ABNORMAL HIGH (ref 0.8–1.2)
Prothrombin Time: 16.6 seconds — ABNORMAL HIGH (ref 11.4–15.2)

## 2022-01-16 MED ORDER — ORAL CARE MOUTH RINSE
15.0000 mL | Freq: Two times a day (BID) | OROMUCOSAL | Status: DC
  Administered 2022-01-16 – 2022-01-18 (×5): 15 mL via OROMUCOSAL

## 2022-01-16 MED ORDER — ALBUTEROL SULFATE (2.5 MG/3ML) 0.083% IN NEBU
2.5000 mg | INHALATION_SOLUTION | RESPIRATORY_TRACT | Status: DC | PRN
  Administered 2022-01-16 (×2): 2.5 mg via RESPIRATORY_TRACT
  Filled 2022-01-16 (×3): qty 3

## 2022-01-16 MED ORDER — APIXABAN 5 MG PO TABS
5.0000 mg | ORAL_TABLET | Freq: Two times a day (BID) | ORAL | Status: DC
  Administered 2022-01-16 – 2022-01-18 (×5): 5 mg via ORAL
  Filled 2022-01-16 (×5): qty 1

## 2022-01-16 MED ORDER — MELATONIN 5 MG PO TABS
5.0000 mg | ORAL_TABLET | Freq: Once | ORAL | Status: AC
  Administered 2022-01-16: 5 mg via ORAL
  Filled 2022-01-16: qty 1

## 2022-01-16 MED ORDER — ATORVASTATIN CALCIUM 20 MG PO TABS
20.0000 mg | ORAL_TABLET | Freq: Every day | ORAL | Status: DC
  Administered 2022-01-16 – 2022-01-17 (×2): 20 mg via ORAL
  Filled 2022-01-16 (×2): qty 1

## 2022-01-16 MED ORDER — POLYVINYL ALCOHOL 1.4 % OP SOLN: 1.0000 [drp] | Freq: Every day | OPHTHALMIC | Status: DC | PRN

## 2022-01-16 MED ORDER — HYDRALAZINE HCL 25 MG PO TABS: 25.0000 mg | ORAL_TABLET | ORAL | Status: DC | PRN

## 2022-01-16 MED ORDER — LABETALOL HCL 5 MG/ML IV SOLN
10.0000 mg | INTRAVENOUS | Status: DC | PRN
  Administered 2022-01-17: 10 mg via INTRAVENOUS
  Filled 2022-01-16: qty 4

## 2022-01-16 MED ORDER — MAGNESIUM SULFATE 2 GM/50ML IV SOLN
2.0000 g | Freq: Once | INTRAVENOUS | Status: AC
  Administered 2022-01-16: 2 g via INTRAVENOUS
  Filled 2022-01-16: qty 50

## 2022-01-16 MED ORDER — METOPROLOL SUCCINATE ER 25 MG PO TB24
25.0000 mg | ORAL_TABLET | Freq: Every day | ORAL | Status: DC
  Administered 2022-01-16: 25 mg via ORAL
  Filled 2022-01-16: qty 1

## 2022-01-16 MED ORDER — AZITHROMYCIN 250 MG PO TABS
500.0000 mg | ORAL_TABLET | Freq: Every day | ORAL | Status: DC
  Administered 2022-01-17 – 2022-01-18 (×2): 500 mg via ORAL
  Filled 2022-01-16 (×2): qty 2

## 2022-01-16 NOTE — Assessment & Plan Note (Signed)
-   Follows with Dr. [name] outpatient - currently on immunotherapy

## 2022-01-16 NOTE — Progress Notes (Addendum)
@   2340 Received patient on stretcher from [facility] accompanied by ER RN on cardizem gtt 44ml/hr. Patient received PO cardizem @ 0008 and this RN stopped IV cardizem drip @ 0110 per MD order. Patient alert, no c/o pain, no s/s of respiratory distress on 3L [place] O2 Sat 92-96% and patient comfortably resting in bed and will continue plan of care.

## 2022-01-16 NOTE — Progress Notes (Signed)
Patient was switched to 12LHF [place] due to a decrease in oxygen saturation on 4lpm [place]. The patient is in AFIB at this time. He is tolerating well . RT will continue to monitor

## 2022-01-16 NOTE — Progress Notes (Signed)
Progress Note   Patient: [name] [name] ZOX:[id number] DOB: 01-Jan-1946 DOA: 01/15/2022     1 DOS: the patient was seen and examined on 01/16/2022   Brief hospital course: [name] is a 76 yo male with PMH malignant melanoma with R lung mets, remote hx afib s/p infections, DMII, HLD who presented with dyspnea and cough.  He began developing increased heart rate prior to admission and due to concern they presented to the ER for further evaluation.  He was found to have ongoing productive cough. CTA chest performed on admission was negative for PE.  It showed findings consistent with multifocal bronchogenic carcinoma generally improved from prior studies.  There were also posterior obstructive changes in the right upper lobe also showing some improvement and increased interstitial and central airway thickening throughout both lungs that were considered either treatment related or due to superimposed edema or inflammation. He was started on Rocephin and azithromycin. He was noted to be in A. fib with RVR and was started on oral Cardizem.  Assessment and Plan: * CAP (community acquired pneumonia)- (present on admission) - continue rocephin and azithro - follow up cutlures  Acute respiratory failure with hypoxia ([place]) - not on home O2 but may possibly need home O2 prior to discharge  - perform walk test daily  Paroxysmal atrial fibrillation with RVR ([place])- (present on admission) - previously on eliquis in the past; no bleeding issues. Was taken off due to lack of afib recurrence. Now has recurred again around setting of infection and/or overall stress of malignancy. 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 [name] outpatient - currently on immunotherapy   Type 2 diabetes mellitus ([place]) - A1c 7.3 % - continue SSI     Subjective: Seen this morning with wife present bedside.  Heart rate was better controlled after being started on Cardizem.  We had a discussion regarding his recurrent A. fib over the years; now with ongoing concern for recurrent infections and underlying malignancy, his A. fib may be occurring more often than he knows.  They were amenable for him restarting back on Eliquis while we would continue to pursue rate control.  Physical Exam: Vitals:   01/16/22 1001 01/16/22 1044 01/16/22 1136 01/16/22 1441  BP: 126/76 114/68 132/73   Pulse: 88 91 89   Resp: (!) 30 (!) 28    Temp:      TempSrc:      SpO2: 90% 94%  95%  Weight:      Height:      Physical Exam Constitutional:      General: He is not in acute distress.    Appearance: Normal appearance.  HENT:     Head: Normocephalic and atraumatic.     Mouth/Throat:     Mouth: Mucous membranes are moist.  Eyes:     Extraocular Movements: Extraocular movements intact.  Cardiovascular:     Rate and Rhythm: Normal rate. Rhythm irregular.     Heart sounds: Normal heart sounds.  Pulmonary:     Effort: Pulmonary effort is normal. No respiratory distress.     Breath sounds: Normal breath sounds. No wheezing.  Abdominal:     General: Bowel sounds are normal. There is no distension.     Palpations: Abdomen is soft.     Tenderness: There is no abdominal tenderness.  Musculoskeletal:        General: Normal range of motion.     Cervical back: Normal  range of motion and neck supple.  Skin:    General: Skin is warm and dry.  Neurological:     General: No focal deficit present.     Mental Status: He is alert.  Psychiatric:        Mood and Affect: Mood normal.        Behavior: Behavior normal.    Data Reviewed:  I have Reviewed nursing notes, Vitals, and Lab results since pt's last encounter. Pertinent lab results Na 121, CO2 18, Hgb 8.7 g/dL  I have ordered test including BMP, CBC, Mg I have reviewed the last note from all staff over past 24 hours,  I have discussed pt's care plan and test results with nursing staff, CM.   Family Communication: wife  Disposition: Status is: Inpatient Remains inpatient appropriate because: CAP, hypoxia, afib with RVR    Planned Discharge Destination: Home      Author: Dwyane Dee, MD 01/16/2022 2:59 PM  For on call review [email or web address].

## 2022-01-16 NOTE — Progress Notes (Signed)
SATURATION QUALIFICATIONS: (This note is used to comply with regulatory documentation for home oxygen)  Patient Saturations on Room Air at Rest = 90%  Patient Saturations on Room Air while Ambulating = 82%  Patient Saturations on 3 Liters of oxygen while Ambulating = 92%  Please briefly explain why patient needs home oxygen: patient presents dyspnea with exertion and requires supplemental O2 to have is oxygen sats >90%

## 2022-01-16 NOTE — Assessment & Plan Note (Addendum)
-   s/p rocephin and azithro in hospital. Continued on azithro to complete course

## 2022-01-16 NOTE — Assessment & Plan Note (Addendum)
-   not on home O2 but given underlying lung pathology due to metastatic melanoma, likely to have difficulty weaning - home O2 arranged prior to discharge

## 2022-01-16 NOTE — Hospital Course (Addendum)
Mr. Jenniges is a 76 yo male with PMH malignant melanoma with R lung mets, remote hx afib s/p infections, DMII, HLD who presented with dyspnea and cough.  He began developing increased heart rate prior to admission and due to concern they presented to the ER for further evaluation.  He was found to have ongoing productive cough. CTA chest performed on admission was negative for PE.  It showed findings consistent with multifocal bronchogenic carcinoma generally improved from prior studies.  There were also posterior obstructive changes in the right upper lobe also showing some improvement and increased interstitial and central airway thickening throughout both lungs that were considered either treatment related or due to superimposed edema or inflammation. He was started on Rocephin and azithromycin. He was noted to be in A. fib with RVR and was started on oral Cardizem. This was transitioned to Toprol given low/downtrending BP however after cardizem started.  Discussion also held regarding anticoagulation and given recurrence of afib and underlying malignancy, likely the benefit of continuing anticoagulation outweighs risk.  Echo performed prior to discharge and outpatient followup with cardiology arranged to re-establish care.

## 2022-01-16 NOTE — Assessment & Plan Note (Addendum)
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

## 2022-01-16 NOTE — Assessment & Plan Note (Addendum)
-   A1c 7.3 % - resume home regimen of amaryl and metformin

## 2022-01-16 NOTE — Assessment & Plan Note (Addendum)
-   previously on eliquis in the past; no bleeding issues. Was taken off due to lack of afib recurrence. Now has recurred again around setting of infection and/or overall stress of malignancy. I feel given his predisposition of paroxysmal afib and rhythm unaware (rate aware) along with increased hypercoag state of malignancy in general, he would benefit from restarting anticoagulation; discussed with patient and his wife bedside who were amenable as well - will restart Eliquis - given RVR, needs rate control at this time; was started on oral diltiazem but BP borderline quickly; will try Toprol instead and adjust as needed - echo ordered prior to discharge and outpt follow up with cardiology arranged

## 2022-01-17 DIAGNOSIS — L8990 Pressure ulcer of unspecified site, unspecified stage: Secondary | ICD-10-CM | POA: Insufficient documentation

## 2022-01-17 DIAGNOSIS — C7800 Secondary malignant neoplasm of unspecified lung: Secondary | ICD-10-CM

## 2022-01-17 DIAGNOSIS — C439 Malignant melanoma of skin, unspecified: Secondary | ICD-10-CM

## 2022-01-17 LAB — GLUCOSE, CAPILLARY
Glucose-Capillary: 197 mg/dL — ABNORMAL HIGH (ref 70–99)
Glucose-Capillary: 240 mg/dL — ABNORMAL HIGH (ref 70–99)
Glucose-Capillary: 204 mg/dL — ABNORMAL HIGH (ref 70–99)
Glucose-Capillary: 195 mg/dL — ABNORMAL HIGH (ref 70–99)

## 2022-01-17 LAB — CBC WITH DIFFERENTIAL/PLATELET
Abs Immature Granulocytes: 0.12 10*3/uL — ABNORMAL HIGH (ref 0.00–0.07)
Basophils Absolute: 0 10*3/uL (ref 0.0–0.1)
Basophils Relative: 0 %
Eosinophils Absolute: 0.5 10*3/uL (ref 0.0–0.5)
Eosinophils Relative: 8 %
HCT: 24 % — ABNORMAL LOW (ref 39.0–52.0)
Hemoglobin: 8.1 g/dL — ABNORMAL LOW (ref 13.0–17.0)
Immature Granulocytes: 2 %
Lymphocytes Relative: 10 %
Lymphs Abs: 0.6 10*3/uL — ABNORMAL LOW (ref 0.7–4.0)
MCH: 31.2 pg (ref 26.0–34.0)
MCHC: 33.8 g/dL (ref 30.0–36.0)
MCV: 92.3 fL (ref 80.0–100.0)
Monocytes Absolute: 0.7 10*3/uL (ref 0.1–1.0)
Monocytes Relative: 11 %
Neutro Abs: 4.3 10*3/uL (ref 1.7–7.7)
Neutrophils Relative %: 69 %
Platelets: 268 10*3/uL (ref 150–400)
RBC: 2.6 MIL/uL — ABNORMAL LOW (ref 4.22–5.81)
RDW: 13.2 % (ref 11.5–15.5)
WBC: 6.2 10*3/uL (ref 4.0–10.5)
nRBC: 0 % (ref 0.0–0.2)

## 2022-01-17 LAB — BASIC METABOLIC PANEL
Anion gap: 10 (ref 5–15)
BUN: 19 mg/dL (ref 8–23)
CO2: 22 mmol/L (ref 22–32)
Calcium: 9.1 mg/dL (ref 8.9–10.3)
Chloride: 92 mmol/L — ABNORMAL LOW (ref 98–111)
Creatinine, Ser: 0.66 mg/dL (ref 0.61–1.24)
GFR, Estimated: >60 mL/min (ref >60)
Glucose, Bld: 201 mg/dL — ABNORMAL HIGH (ref 70–99)
Potassium: 3.6 mmol/L (ref 3.5–5.1)
Sodium: 124 mmol/L — ABNORMAL LOW (ref 135–145)

## 2022-01-17 LAB — MAGNESIUM: Magnesium: 1.7 mg/dL (ref 1.7–2.4)

## 2022-01-17 MED ORDER — TRAZODONE HCL 50 MG PO TABS
50.0000 mg | ORAL_TABLET | Freq: Every evening | ORAL | Status: DC | PRN
  Administered 2022-01-18: 50 mg via ORAL
  Filled 2022-01-17: qty 1

## 2022-01-17 MED ORDER — METOPROLOL SUCCINATE ER 50 MG PO TB24
50.0000 mg | ORAL_TABLET | Freq: Every day | ORAL | Status: DC
  Administered 2022-01-17: 50 mg via ORAL
  Filled 2022-01-17 (×2): qty 1

## 2022-01-17 MED ORDER — ENSURE ENLIVE PO LIQD
237.0000 mL | Freq: Two times a day (BID) | ORAL | Status: DC
  Administered 2022-01-18 (×2): 237 mL via ORAL

## 2022-01-17 MED ORDER — MELATONIN 3 MG PO TABS
6.0000 mg | ORAL_TABLET | Freq: Every day | ORAL | Status: DC
  Administered 2022-01-17: 6 mg via ORAL
  Filled 2022-01-17 (×2): qty 2

## 2022-01-17 MED ORDER — MAGNESIUM SULFATE 2 GM/50ML IV SOLN
2.0000 g | Freq: Once | INTRAVENOUS | Status: AC
  Administered 2022-01-17: 2 g via INTRAVENOUS
  Filled 2022-01-17: qty 50

## 2022-01-17 MED ORDER — ADULT MULTIVITAMIN W/MINERALS CH
1.0000 | ORAL_TABLET | Freq: Every day | ORAL | Status: DC
  Administered 2022-01-17 – 2022-01-18 (×2): 1 via ORAL
  Filled 2022-01-17 (×2): qty 1

## 2022-01-17 MED ORDER — POTASSIUM CHLORIDE CRYS ER 20 MEQ PO TBCR
40.0000 meq | EXTENDED_RELEASE_TABLET | Freq: Once | ORAL | Status: AC
  Administered 2022-01-17: 40 meq via ORAL
  Filled 2022-01-17: qty 2

## 2022-01-17 NOTE — Progress Notes (Signed)
Progress Note   Patient: [name] [name] STM:[id number] DOB: [birth year] DOA: 01/15/2022     2 DOS: the patient was seen and examined on 01/17/2022   Brief hospital course: Mr. Couts is a 76 yo male with PMH malignant melanoma with R lung mets, remote hx afib s/p infections, DMII, HLD who presented with dyspnea and cough.  He began developing increased heart rate prior to admission and due to concern they presented to the ER for further evaluation.  He was found to have ongoing productive cough. CTA chest performed on admission was negative for PE.  It showed findings consistent with multifocal bronchogenic carcinoma generally improved from prior studies.  There were also posterior obstructive changes in the right upper lobe also showing some improvement and increased interstitial and central airway thickening throughout both lungs that were considered either treatment related or due to superimposed edema or inflammation. He was started on Rocephin and azithromycin. He was noted to be in A. fib with RVR and was started on oral Cardizem.  Assessment and Plan: * CAP (community acquired pneumonia)- (present on admission) - continue rocephin and azithro - follow up cutlures  Acute respiratory failure with hypoxia ([place]) - not on home O2 but may possibly need home O2 prior to discharge  - perform walk test daily  Paroxysmal atrial fibrillation with RVR ([place])- (present on admission) - previously on eliquis in the past; no bleeding issues. Was taken off due to lack of afib recurrence. Now has recurred again around setting of infection and/or overall stress of malignancy. 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 [name] outpatient - currently on immunotherapy   Type 2 diabetes mellitus ([place]) - A1c 7.3 % - continue SSI     Subjective: No events overnight.  Heart rate responding fairly well to Toprol change yesterday but dosage being adjusted more this morning.  Breathing and coughing is a little better as well.  Physical Exam: Vitals:   01/16/22 1900 01/17/22 0200 01/17/22 0806 01/17/22 1024  BP: (!) 156/90  (!) 136/94   Pulse: (!) 116  (!) 114 95  Resp: (!) 37   (!) 28  Temp: 97.9 F (36.6 C) 98.3 F (36.8 C)    TempSrc: Oral Oral    SpO2: 95%   99%  Weight:      Height:      Physical Exam Constitutional:      General: He is not in acute distress.    Appearance: Normal appearance.  HENT:     Head: Normocephalic and atraumatic.     Mouth/Throat:     Mouth: Mucous membranes are moist.  Eyes:     Extraocular Movements: Extraocular movements intact.  Cardiovascular:     Rate and Rhythm: Normal rate. Rhythm irregular.     Heart sounds: Normal heart sounds.  Pulmonary:     Effort: Pulmonary effort is normal. No respiratory distress.     Breath sounds: Normal breath sounds. No wheezing.  Abdominal:  General: Bowel sounds are normal. There is no distension.     Palpations: Abdomen is soft.     Tenderness: There is no abdominal tenderness.  Musculoskeletal:        General: Normal range of motion.     Cervical back: Normal range of motion and neck supple.  Skin:    General: Skin is warm and dry.  Neurological:     General: No focal deficit present.     Mental Status: He is alert.  Psychiatric:        Mood and Affect: Mood normal.        Behavior: Behavior normal.    Data Reviewed:  I have Reviewed  nursing notes, Vitals, and Lab results since pt's last encounter. Pertinent lab results Na 124, CO2 22, Hgb 8.1 g/dL  I have ordered test including BMP, CBC, Mg I have reviewed the last note from all staff over past 24 hours,  I have discussed pt's care plan and test results with nursing staff, CM.   Family Communication: wife  Disposition: Status is: Inpatient Remains inpatient appropriate because: CAP, hypoxia, afib with RVR    Planned Discharge Destination: Home     Author: Dwyane Dee, MD 01/17/2022 12:45 PM  For on call review [email or web address].

## 2022-01-17 NOTE — Progress Notes (Signed)
Initial Nutrition Assessment  DOCUMENTATION CODES:   Not applicable  INTERVENTION:   Ensure Enlive po TID, each supplement provides 350 kcal and 20 grams of protein  MVI with minerals daily  NUTRITION DIAGNOSIS:   Increased nutrient needs related to cancer and cancer related treatments as evidenced by estimated needs.  GOAL:   Patient will meet greater than or equal to 90% of their needs  MONITOR:   PO intake, Supplement acceptance  REASON FOR ASSESSMENT:   Malnutrition Screening Tool    ASSESSMENT:   76 yo male admitted with CAP, PAF with RVR. PMH includes malignant melanoma with R lung mets, A fib, DM-2, HLD.  Patient is currently on immunotherapy for malignant melanoma with lung mets.   Labs reviewed. Na 124 CBG: 197 this morning  Medications reviewed and include Novolog, Klor-con, zinc sulfate, mag sulfate, IV Rocephin.   Weight history reviewed. Over the past 3 months, weight has ranged from 67.2 kg (admit weight) to 71.4 kg (11/10/21). 6% weight loss within 3 months is not significant, however, is concerning given recent poor intake.    RD working remotely.  Unable to reach patient by phone. Patient has increased nutrient needs.  RD to add PO supplements to maximize oral intake of protein and calories.  NUTRITION - FOCUSED PHYSICAL EXAM:  Unable to complete  Diet Order:   Diet Order             Diet Carb Modified Fluid consistency: Thin; Room service appropriate? Yes  Diet effective now                   EDUCATION NEEDS:   Education needs have been addressed  Skin:  Skin Assessment: Reviewed RN Assessment  Last BM:  2/5  Height:   Ht Readings from Last 1 Encounters:  01/15/22 5\' 11"  (1.803 m)    Weight:   Wt Readings from Last 1 Encounters:  01/15/22 67.2 kg    BMI:  Body mass index is 20.67 kg/m.  Estimated Nutritional Needs:   Kcal:  2200-2400  Protein:  110-125 gm  Fluid:  >/= 2 L    Lucas Mallow RD, LDN,  CNSC Please refer to Amion for contact information.

## 2022-01-18 ENCOUNTER — Telehealth: Payer: Self-pay | Admitting: *Deleted

## 2022-01-18 ENCOUNTER — Inpatient Hospital Stay (HOSPITAL_COMMUNITY): Payer: Medicare Other

## 2022-01-18 DIAGNOSIS — I4891 Unspecified atrial fibrillation: Secondary | ICD-10-CM | POA: Diagnosis not present

## 2022-01-18 LAB — CBC WITH DIFFERENTIAL/PLATELET
Abs Immature Granulocytes: 0.07 10*3/uL (ref 0.00–0.07)
Basophils Absolute: 0 10*3/uL (ref 0.0–0.1)
Basophils Relative: 0 %
Eosinophils Absolute: 0.6 10*3/uL — ABNORMAL HIGH (ref 0.0–0.5)
Eosinophils Relative: 12 %
HCT: 25.5 % — ABNORMAL LOW (ref 39.0–52.0)
Hemoglobin: 8.4 g/dL — ABNORMAL LOW (ref 13.0–17.0)
Immature Granulocytes: 2 %
Lymphocytes Relative: 12 %
Lymphs Abs: 0.6 10*3/uL — ABNORMAL LOW (ref 0.7–4.0)
MCH: 31.1 pg (ref 26.0–34.0)
MCHC: 32.9 g/dL (ref 30.0–36.0)
MCV: 94.4 fL (ref 80.0–100.0)
Monocytes Absolute: 0.5 10*3/uL (ref 0.1–1.0)
Monocytes Relative: 10 %
Neutro Abs: 3 10*3/uL (ref 1.7–7.7)
Neutrophils Relative %: 64 %
Platelets: 283 10*3/uL (ref 150–400)
RBC: 2.7 MIL/uL — ABNORMAL LOW (ref 4.22–5.81)
RDW: 13.4 % (ref 11.5–15.5)
WBC: 4.6 10*3/uL (ref 4.0–10.5)
nRBC: 0 % (ref 0.0–0.2)

## 2022-01-18 LAB — BASIC METABOLIC PANEL
Anion gap: 9 (ref 5–15)
BUN: 19 mg/dL (ref 8–23)
CO2: 24 mmol/L (ref 22–32)
Calcium: 9.8 mg/dL (ref 8.9–10.3)
Chloride: 94 mmol/L — ABNORMAL LOW (ref 98–111)
Creatinine, Ser: 0.69 mg/dL (ref 0.61–1.24)
GFR, Estimated: >60 mL/min (ref >60)
Glucose, Bld: 217 mg/dL — ABNORMAL HIGH (ref 70–99)
Potassium: 4 mmol/L (ref 3.5–5.1)
Sodium: 127 mmol/L — ABNORMAL LOW (ref 135–145)

## 2022-01-18 LAB — ECHOCARDIOGRAM COMPLETE
AR max vel: 2.59 cm2
AV Area VTI: 2.43 cm2
AV Area mean vel: 2.63 cm2
AV Mean grad: 4 mmHg
AV Peak grad: 7.5 mmHg
Ao pk vel: 1.37 m/s
Area-P 1/2: 5.54 cm2
Height: 71 in
S' Lateral: 2.6 cm
Weight: 2371.2 oz

## 2022-01-18 LAB — GLUCOSE, CAPILLARY
Glucose-Capillary: 198 mg/dL — ABNORMAL HIGH (ref 70–99)
Glucose-Capillary: 276 mg/dL — ABNORMAL HIGH (ref 70–99)

## 2022-01-18 LAB — MAGNESIUM: Magnesium: 1.8 mg/dL (ref 1.7–2.4)

## 2022-01-18 MED ORDER — AZITHROMYCIN 500 MG PO TABS: 500.0000 mg | ORAL_TABLET | Freq: Once | ORAL | 0 refills | Status: AC

## 2022-01-18 MED ORDER — APIXABAN 5 MG PO TABS: 5.0000 mg | ORAL_TABLET | Freq: Two times a day (BID) | ORAL | 3 refills | Status: DC

## 2022-01-18 MED ORDER — METOPROLOL SUCCINATE ER 50 MG PO TB24: 50.0000 mg | ORAL_TABLET | Freq: Every day | ORAL | 3 refills | Status: DC

## 2022-01-18 NOTE — Progress Notes (Signed)
Patient had 1.30sec pause on tele, Dr. Sabino Gasser notified, also soft BP and MD stated to hold metoprolol for today. Will continue to assess patient.

## 2022-01-18 NOTE — Progress Notes (Signed)
Pt's BP was 160/101 & HR 119 @ 2100. Gave him PRN labetalol. BP dropped to 132/92 & HR  98. Will continue to monitor him. Berton Mount, Therapist, sports

## 2022-01-18 NOTE — TOC Initial Note (Signed)
Transition of Care [facility]) - Initial/Assessment Note    Patient Details  Name: [name] [name] MRN: [id number] Date of Birth: [birth year]  Transition of [facility]) CM/SW Contact:    Leeroy Cha, RN Phone Number: 01/18/2022, 8:20 AM  Clinical Narrative:                 Dme : Home o2 ordered through [employer].  Expected Discharge Plan: [place] Barriers to Discharge: Continued Medical Work up   Patient Goals and CMS Choice Patient states their goals for this hospitalization and ongoing recovery are:: TO [place] CMS Medicare.gov Compare Post Acute Care list provided to:: Patient Choice offered to / list presented to : Patient  Expected Discharge Plan and Services Expected Discharge Plan: [place]   Discharge Planning Services: CM Consult Post Acute Care Choice: Durable Medical Equipment Living arrangements for the past 2 months: Single Family Home                 DME Arranged: Oxygen DME Agency: [employer] Date DME Agency Contacted: 01/18/22 Time DME Agency Contacted: [phone] Representative spoke with at DME Agency: danielle            Prior Living Arrangements/Services Living arrangements for the past 2 months: [place] Lives with:: Spouse Patient language and need for interpreter reviewed:: Yes Do you feel safe going back to the place where you live?: Yes            Criminal Activity/Legal Involvement Pertinent to Current Situation/Hospitalization: No - Comment as needed  Activities of Daily Living Home Assistive Devices/Equipment: Eyeglasses (reading glasses) ADL Screening (condition at time of admission) Patient's cognitive ability adequate to safely complete daily activities?: Yes Is the patient deaf or have difficulty hearing?: Yes Does the patient have difficulty seeing, even when wearing glasses/contacts?: No Does the patient have difficulty concentrating, remembering, or making decisions?: No Patient able  to express need for assistance with ADLs?: Yes Does the patient have difficulty dressing or bathing?: No Independently performs ADLs?: Yes (appropriate for developmental age) Does the patient have difficulty walking or climbing stairs?: Yes (sob) Weakness of Legs: Both Weakness of Arms/Hands: Both  Permission Sought/Granted                  Emotional Assessment Appearance:: Appears stated age     Orientation: : Oriented to Self, Oriented to Place, Oriented to  Time, Oriented to Situation Alcohol / Substance Use: Not Applicable Psych Involvement: No (comment)  Admission diagnosis:  Shortness of breath [R06.02] Atrial fibrillation with RVR (HCC) [I48.91] PNA (pneumonia) [J18.9] Malignant neoplasm of lung, unspecified laterality, unspecified part of lung ([place]) [C34.90] Patient Active Problem List   Diagnosis Date Noted   Pressure injury of skin 01/17/2022   Acute respiratory failure with hypoxia ([place]) 01/16/2022   CAP (community acquired pneumonia) 01/15/2022   Hyponatremia 01/15/2022   Rash 12/28/2021   Decreased appetite 12/28/2021   Encounter for antineoplastic immunotherapy 12/17/2021   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 [place] [place] [address] Phone: [phone] Fax: [phone]  Social Determinants of Health (SDOH) Interventions    Readmission Risk Interventions No flowsheet data found.

## 2022-01-18 NOTE — Progress Notes (Signed)
*  PRELIMINARY RESULTS* Echocardiogram 2D Echocardiogram has been performed.  Arlyss Gandy 01/18/2022, 12:15 PM

## 2022-01-18 NOTE — Telephone Encounter (Signed)
Patient wife called. She wanted to inform Dr. [name] that patient currently admitted to [facility] hospital with pneumonia and afib. He may be d'c/d today. She gave patient's cell number if contact needed [phone] Call message routed to Dr. [name]

## 2022-01-18 NOTE — Progress Notes (Signed)
Patient discharged home with wife, discharge instructions given and explained to patient/wife, they verbalized understanding, patient denies any pain/distress, No open wound noted. accompanied home by wife.

## 2022-01-18 NOTE — Care Management Important Message (Signed)
Important Message  Patient Details IM Letter placed in Patients room. Name: [name] [name] MRN: [id number] Date of Birth: 11-20-1946   Medicare Important Message Given:  Yes     Kerin Salen 01/18/2022, 1:36 PM

## 2022-01-18 NOTE — Discharge Summary (Signed)
Physician Discharge Summary   Patient: [name] [name] MRN: [id number] DOB: Jan 19, 1946  Admit date:     01/15/2022  Discharge date: 01/18/22  Discharge Physician: Dwyane Dee   PCP: Haywood Pao, MD   Recommendations at discharge:    Follow with oncology as scheduled Follow up with cardiology and review echo. 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 [place] [facility]: [name] [name] is a 76 yo male with PMH malignant melanoma with R lung mets, remote hx afib s/p infections, DMII, HLD who presented with dyspnea and cough.  He began developing increased heart rate prior to admission and due to concern they presented to the ER for further evaluation.  He was found to have ongoing productive cough. CTA chest performed on admission was negative for PE.  It showed findings consistent with multifocal bronchogenic carcinoma generally improved from prior studies.  There were also posterior obstructive changes in the right upper lobe also showing some improvement and increased interstitial and central airway thickening throughout both lungs that were considered either treatment related or due to superimposed edema or inflammation. He was started on Rocephin and azithromycin. He was noted to be in A. fib with RVR and was started on oral Cardizem. This was transitioned to Toprol given low/downtrending BP however after cardizem started.  Discussion also held regarding anticoagulation and given recurrence of afib and underlying malignancy, likely the benefit of continuing anticoagulation outweighs risk.  Echo performed prior to discharge and  outpatient followup with cardiology arranged to re-establish care.   Assessment and Plan: * CAP (community acquired pneumonia)- (present on admission) - s/p rocephin and azithro in hospital. Continued on azithro to complete course  Acute respiratory failure with hypoxia ([place]) - not on home O2 but given underlying lung pathology due to metastatic melanoma, likely to have difficulty weaning - home O2 arranged prior to discharge   Paroxysmal atrial fibrillation with RVR ([place])- (present on admission) - previously on eliquis in the past; no bleeding issues. Was taken off due to lack of afib recurrence. Now has recurred again around setting of infection and/or overall stress of malignancy. 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 [name] outpatient - currently on immunotherapy   Type 2 diabetes mellitus ([place]) - A1c 7.3 % - resume home regimen of amaryl and metformin           Consultants:  Procedures performed:   Disposition: Home Diet recommendation:  Discharge Diet  Orders (From admission, onward)     Start     Ordered   01/18/22 0000  Diet - low sodium heart healthy        01/18/22 1405   01/18/22 0000  Diet Carb Modified         01/18/22 1405           Carb modified diet  DISCHARGE MEDICATION: Allergies as of 01/18/2022   No Known Allergies      Medication List     STOP taking these medications    aspirin-sod bicarb-citric acid 325 MG Tbef tablet Commonly known as: ALKA-SELTZER   mirtazapine 7.5 MG tablet Commonly known as: REMERON       TAKE these medications    Accu-Chek Aviva Plus w/Device Kit by Does not apply route.   Accu-Chek Softclix Lancets lancets by Other route. Use as instructed   acetaminophen 500 MG tablet Commonly known as: TYLENOL Take 1,000 mg by mouth every 6 (six) hours as needed for moderate pain.   apixaban 5 MG Tabs tablet Commonly known as: ELIQUIS Take 1 tablet (5 mg total) by mouth 2 (two) times daily. What changed: See the new instructions.   aspirin EC 81 MG tablet Take 1 tablet (81 mg total) by mouth every evening.   azithromycin 500 MG tablet Commonly known as: ZITHROMAX Take 1 tablet (500 mg total) by mouth once for 1 dose. Start taking on: January 19, 2022   B-12 2500 MCG Tabs Take 2,500 mcg by mouth once a week.   CORICIDIN HBP PO Take 1 tablet by mouth daily as needed (congestion).   dextromethorphan 30 MG/5ML liquid Commonly known as: DELSYM Take 30 mg by mouth 2 (two) times daily as needed for cough.   dextromethorphan-guaiFENesin 30-600 MG 12hr tablet Commonly known as: MUCINEX DM Take 1 tablet by mouth 2 (two) times daily as needed (congestion).   glimepiride 2 MG tablet Commonly known as: AMARYL Take 1 mg by mouth in the morning and at bedtime.   glucose blood test strip 1 each by Other route as needed for other. Use as instructed   hydrocortisone 2.5 % cream Apply topically 2 (two) times daily. What changed: how much to take   LUBRICATING EYE DROPS OP Place 1 drop into both eyes daily as needed (dry eyes).   metFORMIN 500 MG tablet Commonly known as: GLUCOPHAGE Take 1,000 mg by mouth 2 (two) times daily.   metoprolol  succinate 50 MG 24 hr tablet Commonly known as: TOPROL-XL Take 1 tablet (50 mg total) by mouth daily. Take with or immediately following a meal. Start taking on: January 19, 2022   prochlorperazine 10 MG tablet Commonly known as: COMPAZINE Take 1 tablet (10 mg total) by mouth every 6 (six) hours as needed.   simvastatin 40 MG tablet Commonly known as: ZOCOR Take 40 mg by mouth daily at 6 PM.   zinc gluconate 50 MG tablet Take 50 mg by mouth daily.               Durable Medical Equipment  (From admission, onward)           Start     Ordered   01/17/22 0748  For home use only DME oxygen  Once       Question Answer Comment  Length of Need 6 Months   Mode or (Route) Nasal cannula   Liters per Minute 4   Frequency Continuous (stationary and portable oxygen unit needed)   Oxygen conserving  device Yes   Oxygen delivery system Gas      01/17/22 0747             Discharge Exam: Danley Danker Weights   01/15/22 0830  Weight: 67.2 kg   Physical Exam Constitutional:      General: He is not in acute distress.    Appearance: Normal appearance.  HENT:     Head: Normocephalic and atraumatic.     Mouth/Throat:     Mouth: Mucous membranes are moist.  Eyes:     Extraocular Movements: Extraocular movements intact.  Cardiovascular:     Rate and Rhythm: Normal rate. Rhythm irregular.     Heart sounds: Normal heart sounds.  Pulmonary:     Effort: Pulmonary effort is normal. No respiratory distress.     Breath sounds: Normal breath sounds. No wheezing.  Abdominal:     General: Bowel sounds are normal. There is no distension.     Palpations: Abdomen is soft.     Tenderness: There is no abdominal tenderness.  Musculoskeletal:        General: Normal range of motion.     Cervical back: Normal range of motion and neck supple.  Skin:    General: Skin is warm and dry.  Neurological:     General: No focal deficit present.     Mental Status: He is alert.  Psychiatric:         Mood and Affect: Mood normal.        Behavior: Behavior normal.     Condition at discharge: stable  The results of significant diagnostics from this hospitalization (including imaging, microbiology, ancillary and laboratory) are listed below for reference.   Imaging Studies: CT Angio Chest PE W/Cm &/Or Wo Cm  Result Date: 01/15/2022 CLINICAL DATA:  Productive cough. Pulmonary embolism suspected, high probability. History of lung cancer with radiation therapy. EXAM: CT ANGIOGRAPHY CHEST WITH CONTRAST TECHNIQUE: Multidetector CT imaging of the chest was performed using the standard protocol during bolus administration of intravenous contrast. Multiplanar CT image reconstructions and MIPs were obtained to evaluate the vascular anatomy. RADIATION DOSE REDUCTION: This exam was performed according to the departmental dose-optimization program which includes automated exposure control, adjustment of the mA and/or kV according to patient size and/or use of iterative reconstruction technique. CONTRAST:  170m OMNIPAQUE IOHEXOL 350 MG/ML SOLN COMPARISON:  Chest CT 10/28/2021.  PET-CT 11/04/2021. FINDINGS: Cardiovascular: The pulmonary arteries are well opacified with contrast to the level of the subsegmental branches. There is no evidence of acute pulmonary embolism. Chronic extrinsic narrowing of the right upper lobe pulmonary artery by the right hilar mass. Diffuse atherosclerosis of the aorta, great vessels and coronary arteries, without acute vascular findings. The heart size is normal. There is no pericardial effusion. Mediastinum/Nodes: The previously demonstrated bilateral hilar masses appear improved, consistent with response to therapy. Anterior right suprahilar mass measures approximately 5.9 x 3.7 cm on image 71/4 (previously 6.4 x 5.5 cm). Posterior left hilar mass measures approximately 2.4 x 2.1 cm on image 99/4 (previously 2.8 x 2.4 cm). No enlarged mediastinal or hilar lymph nodes identified. The  thyroid gland, trachea and esophagus demonstrate no significant findings. Lungs/Pleura: Small pleural effusion has decreased in volume. As above, the central right upper lobe perihilar mass has decreased in size with improving patency of the right upper lobe bronchus. Right middle lobe mass measures up to 5.4 cm on sagittal image 45/8 (previously 5.6 cm). There are persistent patchy airspace opacities in the right upper lobe which  likely relate to posterior obstructive pneumonitis. Some of these have partially cleared. However, there is interval increased interstitial and central airway thickening throughout both lungs which may be treatment related or due to superimposed edema or atypical inflammation. Underlying moderate centrilobular and paraseptal emphysema noted. Upper abdomen: No significant findings are seen within the visualized upper abdomen. Musculoskeletal/Chest wall: There is no chest wall mass or suspicious osseous finding. Review of the MIP images confirms the above findings. IMPRESSION: 1. No evidence of acute pulmonary embolism or other acute vascular findings in the chest. 2. 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 No progressive disease identified. 3. Although the posterior obstructive changes in the right upper lobe are generally improved, there is increased interstitial and central airway thickening throughout both lungs which may be treatment related or due to superimposed edema or inflammation. Small right pleural effusion has improved. 4. Aortic Atherosclerosis (ICD10-I70.0) and Emphysema (ICD10-J43.9). Electronically Signed   By: Richardean Sale M.D.   On: 01/15/2022 10:50   DG Chest Port 1 View  Result Date: 01/15/2022 CLINICAL DATA:  76 year old male with non-small cell lung cancer. Possible sepsis. EXAM: PORTABLE CHEST 1 VIEW COMPARISON:  Portable chest 11/23/2021 and  earlier. FINDINGS: Portable AP semi upright view at 0918 hours. Right hilar mass and right lower lobe mass demonstrate mild radiographic regression since December. Mildly improved regional ventilation. Other mediastinal contours are within normal limits. No superimposed pneumothorax, pulmonary edema, pleural effusion or new confluent pulmonary opacity. Stable visualized osseous structures. Negative visible bowel gas. IMPRESSION: 1. Right lung cancer with mild radiographic regression since December. 2. No new cardiopulmonary abnormality. Electronically Signed   By: Genevie Ann M.D.   On: 01/15/2022 09:32   ECHOCARDIOGRAM COMPLETE  Result Date: 01/18/2022    ECHOCARDIOGRAM REPORT   Patient Name:   [name] [name] Date of Exam: 01/18/2022 Medical Rec #:  [id number]  Height:       71.0 in Accession #:    [id number] Weight:       148.2 lb Date of Birth:  1946-09-28  BSA:          1.856 m Patient Age:    76 years   BP:           136/79 mmHg Patient Gender: M          HR:           95 bpm. Exam Location:  Inpatient Procedure: 2D Echo Indications:    Atrial fibrillation  History:        Patient has prior history of Echocardiogram examinations, most                 recent 05/12/2017. Signs/Symptoms:Shortness of Breath; Risk                 Factors:Dyslipidemia and Diabetes.  Sonographer:    Arlyss Gandy Referring Phys: [place]  Sonographer Comments: Image acquisition challenging due to patient body habitus. IMPRESSIONS  1. Left ventricular ejection fraction, by estimation, is 60 to 65%. The left ventricle has normal function. The left ventricle has no regional wall motion abnormalities. Left ventricular diastolic parameters are indeterminate.  2. Right ventricular systolic function is normal. The right ventricular size is normal. There is moderately elevated pulmonary artery systolic pressure.  3. The mitral valve is normal in structure. No evidence of mitral valve regurgitation. No evidence of mitral stenosis.  4. The  aortic valve  is normal in structure. Aortic valve regurgitation is not visualized. No aortic stenosis is present.  5. The inferior vena cava is dilated in size with <50% respiratory variability, suggesting right atrial pressure of 15 mmHg. FINDINGS  Left Ventricle: Left ventricular ejection fraction, by estimation, is 60 to 65%. The left ventricle has normal function. The left ventricle has no regional wall motion abnormalities. The left ventricular internal cavity size was normal in size. There is  no left ventricular hypertrophy. Left ventricular diastolic parameters are indeterminate. Right Ventricle: The right ventricular size is normal. No increase in right ventricular wall thickness. Right ventricular systolic function is normal. There is moderately elevated pulmonary artery systolic pressure. The tricuspid regurgitant velocity is 2.76 m/s, and with an assumed right atrial pressure of 15 mmHg, the estimated right ventricular systolic pressure is 40.3 mmHg. Left Atrium: Left atrial size was normal in size. Right Atrium: Right atrial size was normal in size. Pericardium: There is no evidence of pericardial effusion. Mitral Valve: The mitral valve is normal in structure. Mild mitral annular calcification. No evidence of mitral valve regurgitation. No evidence of mitral valve stenosis. Tricuspid Valve: The tricuspid valve is normal in structure. Tricuspid valve regurgitation is mild . No evidence of tricuspid stenosis. Aortic Valve: The aortic valve is normal in structure. Aortic valve regurgitation is not visualized. No aortic stenosis is present. Aortic valve mean gradient measures 4.0 mmHg. Aortic valve peak gradient measures 7.5 mmHg. Aortic valve area, by VTI measures 2.43 cm. Pulmonic Valve: The pulmonic valve was normal in structure. Pulmonic valve regurgitation is not visualized. No evidence of pulmonic stenosis. Aorta: The aortic root is normal in size and structure. Venous: The inferior vena cava is  dilated in size with less than 50% respiratory variability, suggesting right atrial pressure of 15 mmHg. IAS/Shunts: No atrial level shunt detected by color flow Doppler.  LEFT VENTRICLE PLAX 2D LVIDd:         3.90 cm   Diastology LVIDs:         2.60 cm   LV e' medial:    9.90 cm/s LV PW:         0.80 cm   LV E/e' medial:  14.2 LV IVS:        0.80 cm   LV e' lateral:   8.59 cm/s LVOT diam:     2.00 cm   LV E/e' lateral: 16.4 LV SV:         55 LV SV Index:   30 LVOT Area:     3.14 cm  RIGHT VENTRICLE             IVC RV Basal diam:  3.20 cm     IVC diam: 2.30 cm RV Mid diam:    2.80 cm RV S prime:     13.40 cm/s TAPSE (M-mode): 2.0 cm LEFT ATRIUM             Index        RIGHT ATRIUM           Index LA diam:        3.50 cm 1.89 cm/m   RA Area:     14.60 cm LA Vol (A2C):   47.6 ml 25.64 ml/m  RA Volume:   34.70 ml  18.69 ml/m LA Vol (A4C):   56.5 ml 30.44 ml/m LA Biplane Vol: 51.8 ml 27.90 ml/m  AORTIC VALVE AV Area (Vmax):    2.59 cm AV Area (Vmean):   2.63 cm AV  Area (VTI):     2.43 cm AV Vmax:           137.00 cm/s AV Vmean:          89.200 cm/s AV VTI:            0.226 m AV Peak Grad:      7.5 mmHg AV Mean Grad:      4.0 mmHg LVOT Vmax:         113.00 cm/s LVOT Vmean:        74.700 cm/s LVOT VTI:          0.175 m LVOT/AV VTI ratio: 0.77  AORTA Ao Root diam: 2.80 cm Ao Asc diam:  3.20 cm MITRAL VALVE                TRICUSPID VALVE MV Area (PHT): 5.54 cm     TR Peak grad:   30.5 mmHg MV Decel Time: 137 msec     TR Vmax:        276.00 cm/s MV E velocity: 141.00 cm/s                             SHUNTS                             Systemic VTI:  0.18 m                             Systemic Diam: 2.00 cm Candee Furbish MD Electronically signed by Candee Furbish MD Signature Date/Time: 01/18/2022/2:05:34 PM    Final     Microbiology: Results for orders placed or performed during the hospital encounter of 01/15/22  Blood Culture (routine x 2)     Status: None (Preliminary result)   Collection Time: 01/15/22  9:14 AM    Specimen: BLOOD  Result Value Ref Range Status   Specimen Description   Final    BLOOD LEFT ANTECUBITAL Performed at [facility], [place] [address] [place], [place] [address]    Special Requests   Final    BOTTLES DRAWN AEROBIC AND ANAEROBIC Blood Culture results may not be optimal due to an excessive volume of blood received in culture bottles Performed at [facility] [address] [place], [place] [address]    Culture   Final    NO GROWTH 3 DAYS Performed at [facility], [place] [address] [place], [place] [address]    Report Status PENDING  Incomplete  Blood Culture (routine x 2)     Status: None (Preliminary result)   Collection Time: 01/15/22  9:14 AM   Specimen: BLOOD  Result Value Ref Range Status   Specimen Description   Final    BLOOD RIGHT ANTECUBITAL Performed at [facility] [address], [place], [place] [address]    Special Requests   Final    BOTTLES DRAWN AEROBIC AND ANAEROBIC Blood Culture adequate volume Performed at [facility] [address], [place], [place] [address]    Culture   Final    NO GROWTH 3 DAYS Performed at [facility], [place] [address], [place], [place] [address]    Report Status PENDING  Incomplete  Resp Panel by RT-PCR (Flu A&B, Covid) Nasopharyngeal Swab     Status: None   Collection Time: 01/15/22  9:14 AM   Specimen: Nasopharyngeal Swab; Nasopharyngeal(NP) swabs in vial transport medium  Result Value Ref Range Status   SARS Coronavirus 2 by RT PCR NEGATIVE NEGATIVE Final    Comment: (NOTE) SARS-CoV-2 target nucleic acids are NOT DETECTED.  The SARS-CoV-2 RNA is generally detectable in upper respiratory specimens during the acute phase of infection. The lowest concentration of SARS-CoV-2 viral copies this assay can detect is 138 copies/mL. A negative result does not preclude SARS-Cov-2 infection and should not be used as the sole basis for  treatment or other patient management decisions. A negative result may occur with  improper specimen collection/handling, submission of specimen other than nasopharyngeal swab, presence of viral mutation(s) within the areas targeted by this assay, and inadequate number of viral copies(<138 copies/mL). A negative result must be combined with clinical observations, patient history, and epidemiological information. The expected result is Negative.  Fact Sheet for Patients:  EntrepreneurPulse.com.au  Fact Sheet for Healthcare Providers:  IncredibleEmployment.be  This test is no t yet approved or cleared by the [place] FDA and  has been authorized for detection and/or diagnosis of SARS-CoV-2 by FDA under an Emergency Use Authorization (EUA). This EUA will remain  in effect (meaning this test can be used) for the duration of the COVID-19 declaration under Section 564(b)(1) of the Act, 21 U.S.C.section 360bbb-3(b)(1), unless the authorization is terminated  or revoked sooner.       Influenza A by PCR NEGATIVE NEGATIVE Final   Influenza B by PCR NEGATIVE NEGATIVE Final    Comment: (NOTE) The Xpert Xpress SARS-CoV-2/FLU/RSV plus assay is intended as an aid in the diagnosis of influenza from Nasopharyngeal swab specimens and should not be used as a sole basis for treatment. Nasal washings and aspirates are unacceptable for Xpert Xpress SARS-CoV-2/FLU/RSV testing.  Fact Sheet for Patients: EntrepreneurPulse.com.au  Fact Sheet for Healthcare Providers: IncredibleEmployment.be  This test is not yet approved or cleared by the [place] FDA and has been authorized for detection and/or diagnosis of SARS-CoV-2 by FDA under an Emergency Use Authorization (EUA). This EUA will remain in effect (meaning this test can be used) for the duration of the COVID-19 declaration under Section 564(b)(1) of the Act, 21  U.S.C. section 360bbb-3(b)(1), unless the authorization is terminated or revoked.  Performed at [facility], [address] [place], [place] [address]     Labs: CBC: Recent Labs  Lab 01/15/22 0914 01/16/22 0946 01/17/22 0346 01/18/22 0323  WBC 10.5 7.4 6.2 4.6  NEUTROABS 9.7* 6.0 4.3 3.0  HGB 10.3* 8.7* 8.1* 8.4*  HCT 30.4* 26.2* 24.0* 25.5*  MCV 92.7 93.9 92.3 94.4  PLT 310 259 268 102   Basic Metabolic Panel: Recent Labs  Lab 01/15/22 0914 01/16/22 0946 01/16/22 1552 01/17/22 0346 01/18/22 0323  NA 126* 121* 122* 124* 127*  K 4.1 3.8 3.9 3.6 4.0  CL 92* 94* 91* 92* 94*  CO2 21* 18* 19* 22 24  GLUCOSE 184* 219* 230* 201* 217*  BUN 18 16 24* 19 19  CREATININE 0.95 0.73 0.74 0.66 0.69  CALCIUM 10.2 8.9 9.5 9.1 9.8  MG  --  1.5*  --  1.7 1.8   Liver Function Tests: Recent Labs  Lab 01/15/22 0914 01/16/22 0946  AST 42* 23  ALT 52* 35  ALKPHOS 59 42  BILITOT 0.7 0.5  PROT 7.6 6.0*  ALBUMIN 2.8* 2.3*   CBG: Recent Labs  Lab 01/17/22 1206 01/17/22 1736 01/17/22 2038 01/18/22 0748 01/18/22 1129  GLUCAP  240* 204* 195* 198* 276*    Discharge time spent: greater than 30 minutes.  Signed: Dwyane Dee, MD [facility] 01/18/2022

## 2022-01-20 ENCOUNTER — Ambulatory Visit: Payer: Medicare Other | Admitting: Pulmonary Disease

## 2022-01-20 ENCOUNTER — Encounter: Payer: Self-pay | Admitting: Pulmonary Disease

## 2022-01-20 ENCOUNTER — Other Ambulatory Visit: Payer: Self-pay

## 2022-01-20 VITALS — BP 122/72 | HR 94 | Temp 97.7°F | Ht 71.0 in | Wt 155.4 lb

## 2022-01-20 DIAGNOSIS — Z87891 Personal history of nicotine dependence: Secondary | ICD-10-CM | POA: Diagnosis not present

## 2022-01-20 DIAGNOSIS — J9611 Chronic respiratory failure with hypoxia: Secondary | ICD-10-CM

## 2022-01-20 DIAGNOSIS — Z8701 Personal history of pneumonia (recurrent): Secondary | ICD-10-CM | POA: Diagnosis not present

## 2022-01-20 DIAGNOSIS — C439 Malignant melanoma of skin, unspecified: Secondary | ICD-10-CM | POA: Diagnosis not present

## 2022-01-20 DIAGNOSIS — C7800 Secondary malignant neoplasm of unspecified lung: Secondary | ICD-10-CM

## 2022-01-20 LAB — CULTURE, BLOOD (ROUTINE X 2)
Culture: NO GROWTH
Culture: NO GROWTH
Special Requests: ADEQUATE
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
## 2022-01-20 NOTE — Patient Instructions (Signed)
Thank you for visiting Dr. Valeta Harms at [facility]. Today we recommend the following:  Call us if needed  We will walk you today to establish o2 needs   Return in about 6 months (around 07/20/2022), or if symptoms worsen or fail to improve.    Please do your part to reduce the spread of COVID-19.

## 2022-01-25 ENCOUNTER — Encounter: Payer: Self-pay | Admitting: Internal Medicine

## 2022-01-27 ENCOUNTER — Inpatient Hospital Stay: Payer: Medicare Other | Attending: Internal Medicine

## 2022-01-27 ENCOUNTER — Inpatient Hospital Stay: Payer: Medicare Other | Admitting: Internal Medicine

## 2022-01-27 ENCOUNTER — Inpatient Hospital Stay: Payer: Medicare Other

## 2022-01-27 ENCOUNTER — Other Ambulatory Visit: Payer: Self-pay | Admitting: Physician Assistant

## 2022-01-27 ENCOUNTER — Other Ambulatory Visit: Payer: Self-pay

## 2022-01-27 VITALS — BP 136/75 | Temp 98.0°F | Resp 18

## 2022-01-27 VITALS — BP 123/72 | HR 84 | Temp 96.3°F | Resp 17 | Ht 71.0 in | Wt 142.2 lb

## 2022-01-27 DIAGNOSIS — Z5112 Encounter for antineoplastic immunotherapy: Secondary | ICD-10-CM

## 2022-01-27 DIAGNOSIS — Z79899 Other long term (current) drug therapy: Secondary | ICD-10-CM | POA: Diagnosis not present

## 2022-01-27 DIAGNOSIS — C439 Malignant melanoma of skin, unspecified: Secondary | ICD-10-CM | POA: Diagnosis not present

## 2022-01-27 DIAGNOSIS — C7800 Secondary malignant neoplasm of unspecified lung: Secondary | ICD-10-CM

## 2022-01-27 DIAGNOSIS — C7801 Secondary malignant neoplasm of right lung: Secondary | ICD-10-CM | POA: Insufficient documentation

## 2022-01-27 LAB — CBC WITH DIFFERENTIAL (CANCER CENTER ONLY)
Abs Immature Granulocytes: 0.03 10*3/uL (ref 0.00–0.07)
Basophils Absolute: 0 10*3/uL (ref 0.0–0.1)
Basophils Relative: 1 %
Eosinophils Absolute: 0.2 10*3/uL (ref 0.0–0.5)
Eosinophils Relative: 4 %
HCT: 27.8 % — ABNORMAL LOW (ref 39.0–52.0)
Hemoglobin: 9.1 g/dL — ABNORMAL LOW (ref 13.0–17.0)
Immature Granulocytes: 1 %
Lymphocytes Relative: 22 %
Lymphs Abs: 1.2 10*3/uL (ref 0.7–4.0)
MCH: 30.6 pg (ref 26.0–34.0)
MCHC: 32.7 g/dL (ref 30.0–36.0)
MCV: 93.6 fL (ref 80.0–100.0)
Monocytes Absolute: 0.7 10*3/uL (ref 0.1–1.0)
Monocytes Relative: 14 %
Neutro Abs: 3.2 10*3/uL (ref 1.7–7.7)
Neutrophils Relative %: 58 %
Platelet Count: 288 10*3/uL (ref 150–400)
RBC: 2.97 MIL/uL — ABNORMAL LOW (ref 4.22–5.81)
RDW: 13.9 % (ref 11.5–15.5)
WBC Count: 5.3 10*3/uL (ref 4.0–10.5)
nRBC: 0 % (ref 0.0–0.2)

## 2022-01-27 LAB — CMP (CANCER CENTER ONLY)
ALT: 28 U/L (ref 0–44)
AST: 24 U/L (ref 15–41)
Albumin: 3 g/dL — ABNORMAL LOW (ref 3.5–5.0)
Alkaline Phosphatase: 53 U/L (ref 38–126)
Anion gap: 8 (ref 5–15)
BUN: 27 mg/dL — ABNORMAL HIGH (ref 8–23)
CO2: 27 mmol/L (ref 22–32)
Calcium: 12.5 mg/dL — ABNORMAL HIGH (ref 8.9–10.3)
Chloride: 96 mmol/L — ABNORMAL LOW (ref 98–111)
Creatinine: 0.88 mg/dL (ref 0.61–1.24)
GFR, Estimated: >60 mL/min (ref >60)
Glucose, Bld: 220 mg/dL — ABNORMAL HIGH (ref 70–99)
Potassium: 4.4 mmol/L (ref 3.5–5.1)
Sodium: 131 mmol/L — ABNORMAL LOW (ref 135–145)
Total Bilirubin: 0.2 mg/dL — ABNORMAL LOW (ref 0.3–1.2)
Total Protein: 6.9 g/dL (ref 6.5–8.1)

## 2022-01-27 LAB — TSH: TSH: 3.645 u[IU]/mL (ref 0.320–4.118)

## 2022-01-27 MED ORDER — DIPHENHYDRAMINE HCL 50 MG/ML IJ SOLN
25.0000 mg | Freq: Once | INTRAMUSCULAR | Status: AC
  Administered 2022-01-27: 25 mg via INTRAVENOUS
  Filled 2022-01-27: qty 1

## 2022-01-27 MED ORDER — SODIUM CHLORIDE 0.9 % IV SOLN: Freq: Once | INTRAVENOUS | Status: AC

## 2022-01-27 MED ORDER — SODIUM CHLORIDE 0.9 % IV SOLN
1.0000 mg/kg | Freq: Once | INTRAVENOUS | Status: AC
  Administered 2022-01-27: 70 mg via INTRAVENOUS
  Filled 2022-01-27: qty 7

## 2022-01-27 MED ORDER — FAMOTIDINE IN NACL 20-0.9 MG/50ML-% IV SOLN
20.0000 mg | Freq: Once | INTRAVENOUS | Status: AC
  Administered 2022-01-27: 20 mg via INTRAVENOUS
  Filled 2022-01-27: qty 50

## 2022-01-27 MED ORDER — SODIUM CHLORIDE 0.9 % IV SOLN
3.0000 mg/kg | Freq: Once | INTRAVENOUS | Status: AC
  Administered 2022-01-27: 200 mg via INTRAVENOUS
  Filled 2022-01-27: qty 40

## 2022-01-27 MED ORDER — ZOLEDRONIC ACID 4 MG/100ML IV SOLN
4.0000 mg | Freq: Once | INTRAVENOUS | Status: AC
  Administered 2022-01-27: 4 mg via INTRAVENOUS
  Filled 2022-01-27: qty 100
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
## 2022-01-27 NOTE — Patient Instructions (Signed)
[facility]  Discharge Instructions: Thank you for choosing [facility] to provide your oncology and hematology care.   If you have a lab appointment with the [facility], please go directly to the [facility] and check in at the registration area.   Wear comfortable clothing and clothing appropriate for easy access to any Portacath or PICC line.   We strive to give you quality time with your provider. You may need to reschedule your appointment if you arrive late (15 or more minutes).  Arriving late affects you and other patients whose appointments are after yours.  Also, if you miss three or more appointments without notifying the office, you may be dismissed from the clinic at the providers discretion.      For prescription refill requests, have your pharmacy contact our office and allow 72 hours for refills to be completed.    Today you received the following chemotherapy and/or immunotherapy agents: Opdivo(nivolumab) and yervoy (Ipilimumab)   To help prevent nausea and vomiting after your treatment, we encourage you to take your nausea medication as directed.  BELOW ARE SYMPTOMS THAT SHOULD BE REPORTED IMMEDIATELY: *FEVER GREATER THAN 100.4 F (38 C) OR HIGHER *CHILLS OR SWEATING *NAUSEA AND VOMITING THAT IS NOT CONTROLLED WITH YOUR NAUSEA MEDICATION *UNUSUAL SHORTNESS OF BREATH *UNUSUAL BRUISING OR BLEEDING *URINARY PROBLEMS (pain or burning when urinating, or frequent urination) *BOWEL PROBLEMS (unusual diarrhea, constipation, pain near the anus) TENDERNESS IN MOUTH AND THROAT WITH OR WITHOUT PRESENCE OF ULCERS (sore throat, sores in mouth, or a toothache) UNUSUAL RASH, SWELLING OR PAIN  UNUSUAL VAGINAL DISCHARGE OR ITCHING   Items with * indicate a potential emergency and should be followed up as soon as possible or go to the Emergency Department if any problems should occur.  Please show the CHEMOTHERAPY ALERT CARD or  IMMUNOTHERAPY ALERT CARD at check-in to the Emergency Department and triage nurse.  Should you have questions after your visit or need to cancel or reschedule your appointment, please contact [facility]  Dept: [phone]  and follow the prompts.  Office hours are 8:00 a.m. to 4:30 p.m. Monday - Friday. Please note that voicemails left after 4:00 p.m. may not be returned until the following business day.  We are closed weekends and major holidays. You have access to a nurse at all times for urgent questions. Please call the main number to the clinic Dept: [phone] and follow the prompts.   For any non-urgent questions, you may also contact your provider using MyChart. We now offer e-Visits for anyone 46 and older to request care online for non-urgent symptoms. For details visit [email or web address].   Also download the MyChart app! Go to the app store, search "MyChart", open the app, select , and log in with your MyChart username and password.  Due to Covid, a mask is required upon entering the hospital/clinic. If you do not have a mask, one will be given to you upon arrival. For doctor visits, patients may have 1 support person aged 24 or older with them. For treatment visits, patients cannot have anyone with them due to current Covid guidelines and our immunocompromised population.   Nivolumab injection What is this medication? NIVOLUMAB (nye VOL ue mab) is a monoclonal antibody. It treats certain types of cancer. Some of the cancers treated are colon cancer, head and neck cancer, Hodgkin lymphoma, lung cancer, and melanoma. This medicine may be used for other purposes; ask your  health care provider or pharmacist if you have questions. COMMON BRAND NAME(S): Opdivo What should I tell my care team before I take this medication? They need to know if you have any of these conditions: Autoimmune diseases such as Crohn's disease, ulcerative colitis, or  lupus Have had or planning to have an allogeneic stem cell transplant (uses someone else's stem cells) History of chest radiation Organ transplant Nervous system problems such as myasthenia gravis or Guillain-Barre syndrome An unusual or allergic reaction to nivolumab, other medicines, foods, dyes, or preservatives Pregnant or trying to get pregnant Breast-feeding How should I use this medication? This medication is injected into a vein. It is given in a hospital or clinic setting. A special MedGuide will be given to you before each treatment. Be sure to read this information carefully each time. Talk to your care team regarding the use of this medication in children. While it may be prescribed for children as young as 12 years for selected conditions, precautions do apply. Overdosage: If you think you have taken too much of this medicine contact a poison control center or emergency room at once. NOTE: This medicine is only for you. Do not share this medicine with others. What if I miss a dose? Keep appointments for follow-up doses. It is important not to miss your dose. Call your care team if you are unable to keep an appointment. What may interact with this medication? Interactions have not been studied. This list may not describe all possible interactions. Give your health care provider a list of all the medicines, herbs, non-prescription drugs, or dietary supplements you use. Also tell them if you smoke, drink alcohol, or use illegal drugs. Some items may interact with your medicine. What should I watch for while using this medication? Your condition will be monitored carefully while you are receiving this medication. You may need blood work done while you are taking this medication. Do not become pregnant while taking this medication or for 5 months after stopping it. Women should inform their care team if they wish to become pregnant or think they might be pregnant. There is a potential  for serious harm to an unborn child. Talk to your care team for more information. Do not breast-feed an infant while taking this medication or for 5 months after stopping it. What side effects may I notice from receiving this medication? Side effects that you should report to your care team as soon as possible: Allergic reactions--skin rash, itching, hives, swelling of the face, lips, tongue, or throat Bloody or black, tar-like stools Change in vision Chest pain Diarrhea Dry cough, shortness of breath or trouble breathing Eye pain Fast or irregular heartbeat Fever, chills High blood sugar (hyperglycemia)--increased thirst or amount of urine, unusual weakness or fatigue, blurry vision High thyroid levels (hyperthyroidism)--fast or irregular heartbeat, weight loss, excessive sweating or sensitivity to heat, tremors or shaking, anxiety, nervousness, irregular menstrual cycle or spotting Kidney injury--decrease in the amount of urine, swelling of the ankles, hands, or feet Liver injury--right upper belly pain, loss of appetite, nausea, light-colored stool, dark yellow or brown urine, yellowing skin or eyes, unusual weakness or fatigue Low red blood cell count--unusual weakness or fatigue, dizziness, headache, trouble breathing Low thyroid levels (hypothyroidism)--unusual weakness or fatigue, increased sensitivity to cold, constipation, hair loss, dry skin, weight gain, feelings of depression Mood and behavior changes-confusion, change in sex drive or performance, irritability Muscle pain or cramps Pain, tingling, or numbness in the hands or feet, muscle weakness, trouble  walking, loss of balance or coordination Red or dark brown urine Redness, blistering, peeling, or loosening of the skin, including inside the mouth Stomach pain Unusual bruising or bleeding Side effects that usually do not require medical attention (report to your care team if they continue or are bothersome): Bone  pain Constipation Loss of appetite Nausea Tiredness Vomiting This list may not describe all possible side effects. Call your doctor for medical advice about side effects. You may report side effects to FDA at 1-800-FDA-1088. Where should I keep my medication? This medication is given in a hospital or clinic and will not be stored at home. NOTE: This sheet is a summary. It may not cover all possible information. If you have questions about this medicine, talk to your doctor, pharmacist, or health care provider.  2022 Elsevier/Gold Standard (2021-08-18 00:00:00)  Ipilimumab injection What is this medication? IPILIMUMAB (IP i LIM ue mab) is a monoclonal antibody. It treats colorectal cancer, esophageal cancer, kidney cancer, liver cancer, lung cancer, melanoma, and mesothelioma. This medicine may be used for other purposes; ask your health care provider or pharmacist if you have questions. COMMON BRAND NAME(S): YERVOY What should I tell my care team before I take this medication? They need to know if you have any of these conditions: autoimmune diseases like Crohn's disease, ulcerative colitis, or lupus have had or planning to have an allogeneic stem cell transplant (uses someone else's stem cells) history of organ transplant nervous system problems like myasthenia gravis or Guillain-Barre syndrome an unusual or allergic reaction to ipilimumab, other medicines, foods, dyes, or preservatives pregnant or trying to get pregnant breast-feeding How should I use this medication? This medicine is for infusion into a vein. It is given by a health care professional in a hospital or clinic setting. A special MedGuide will be given to you before each treatment. Be sure to read this information carefully each time. Talk to your pediatrician regarding the use of this medicine in children. While this drug may be prescribed for children as young as 12 years for selected conditions, precautions do  apply. Overdosage: If you think you have taken too much of this medicine contact a poison control center or emergency room at once. NOTE: This medicine is only for you. Do not share this medicine with others. What if I miss a dose? It is important not to miss your dose. Call your doctor or health care professional if you are unable to keep an appointment. What may interact with this medication? Interactions are not expected. This list may not describe all possible interactions. Give your health care provider a list of all the medicines, herbs, non-prescription drugs, or dietary supplements you use. Also tell them if you smoke, drink alcohol, or use illegal drugs. Some items may interact with your medicine. What should I watch for while using this medication? Tell your doctor or healthcare professional if your symptoms do not start to get better or if they get worse. Do not become pregnant while taking this medicine or for 3 months after stopping it. Women should inform their doctor if they wish to become pregnant or think they might be pregnant. There is a potential for serious side effects to an unborn child. Talk to your health care professional or pharmacist for more information. Do not breast-feed an infant while taking this medicine or for 3 months after the last dose. Your condition will be monitored carefully while you are receiving this medicine. You may need blood work done while  you are taking this medicine. What side effects may I notice from receiving this medication? Side effects that you should report to your doctor or health care professional as soon as possible: allergic reactions like skin rash, itching or hives, swelling of the face, lips, or tongue black, tarry stools bloody or watery diarrhea changes in vision dizziness eye pain fast, irregular heartbeat feeling anxious feeling faint or lightheaded, falls nausea, vomiting pain, tingling, numbness in the hands or  feet redness, blistering, peeling or loosening of the skin, including inside the mouth signs and symptoms of liver injury like dark yellow or brown urine; general ill feeling or flu-like symptoms; light-colored stools; loss of appetite; nausea; right upper belly pain; unusually weak or tired; yellowing of the eyes or skin unusual bleeding or bruising Side effects that usually do not require medical attention (report to your doctor or health care professional if they continue or are bothersome): headache loss of appetite trouble sleeping This list may not describe all possible side effects. Call your doctor for medical advice about side effects. You may report side effects to FDA at 1-800-FDA-1088. Where should I keep my medication? This drug is given in a hospital or clinic and will not be stored at home. NOTE: This sheet is a summary. It may not cover all possible information. If you have questions about this medicine, talk to your doctor, pharmacist, or health care provider.  2022 Elsevier/Gold Standard (2021-08-18 00:00:00)

## 2022-01-27 NOTE — Progress Notes (Signed)
Ipilimumab (YERVOY) Patient Monitoring Assessment   Is the patient experiencing any of the following general symptoms?:  [x] Difficulty performing normal activities (fatigue related) [] Feeling sluggish or cold all the time [] Unusual weight gain [] Constant or unusual headaches [] Feeling dizzy or faint [] Changes in eyesight (blurry vision, double vision, or other vision problems) [x] Changes in mood or behavior (ex: decreased sex drive, irritability, or forgetfulness) stated forgetfulness mildly worse than baseline [] Starting new medications (ex: steroids, other medications that lower immune response) [] Patient is not experiencing any of the general symptoms above.    Gastrointestinal  Patient is having constipation- has been two days (MD AWARE- meds)  since  having bowel movements each day.  Is this different from baseline? [x] Yes [] No Are your stools watery or do they have a foul smell? [] Yes [x] No Have you seen blood in your stools? [] Yes [x] No Are your stools dark, tarry, or sticky? [] Yes [x] No Are you having pain or tenderness in your belly? [] Yes [x] No  Skin Does your skin itch? [x] Yes [] No -MD addressed/aware Do you have a rash? [x] Yes [] No -healing from previous rash/tx Has your skin blistered and/or peeled? [] Yes [x] No Do you have sores in your mouth? [] Yes [x] No  Hepatic Has your urine been dark or tea colored? [] Yes [x] No Have you noticed that your skin or the whites of your eyes are turning yellow? [] Yes [x] No Are you bleeding or bruising more easily than normal? [] Yes [x] No Are you nauseous and/or vomiting? [] Yes [x] No Do you have pain on the right side of your stomach? [] Yes [x] No  Neurologic  Are you having unusual weakness of legs, arms, or face? [] Yes [x] No Are you having numbness or tingling in your hands or feet? [] Yes [x] No  Overall, patient has no significant issues and what was mentioned has been treated or at least addressed by the MD per pt.  [name] [name]

## 2022-01-27 NOTE — Progress Notes (Signed)
MD made aware of elevated calcium, per Dr.Mohamed, ok to proceed but pt will also be getting 1L NS/2Hrs, order placed pt made aware.   Per Cassie Heilingoepter, PA, we are to give zometa today. Pt educated and agreeable.

## 2022-02-01 ENCOUNTER — Encounter: Payer: Self-pay | Admitting: Internal Medicine

## 2022-02-01 ENCOUNTER — Telehealth: Payer: Self-pay | Admitting: Medical Oncology

## 2022-02-01 NOTE — Telephone Encounter (Signed)
Edema in feet. Denies swelling in ankles or legs. Denies SOB, dyspnea.  Asking for [facility] to prescribe Lasix.  Wife called Dr Rosana Hoes for Lasix and he instructed Avyaan to call Dr [name].    02/28-Pt has a new pt appt with Dr Johnsie Cancel to establish care for atrial fib.   Next tx 3/8.   Mohamed to advise.

## 2022-02-01 NOTE — Telephone Encounter (Signed)
Edema in feet. Denies swelling in ankles or legs. Denies SOB, dyspnea. Asking for [facility] to prescribe Lasix.  Wife called Dr Rosana Hoes for Lasix and he instructed Demetris to call Dr [name].   02/28-Pt has a new pt appt with Dr Johnsie Cancel to establish care for atrial fib.  Next tx 3/8.  Mohamed to advise.

## 2022-02-02 ENCOUNTER — Other Ambulatory Visit: Payer: Self-pay | Admitting: Internal Medicine

## 2022-02-02 ENCOUNTER — Encounter: Payer: Self-pay | Admitting: Physician Assistant

## 2022-02-02 ENCOUNTER — Encounter: Payer: Self-pay | Admitting: Internal Medicine

## 2022-02-02 ENCOUNTER — Other Ambulatory Visit: Payer: Self-pay

## 2022-02-02 DIAGNOSIS — R600 Localized edema: Secondary | ICD-10-CM

## 2022-02-02 MED ORDER — FUROSEMIDE 20 MG PO TABS: ORAL_TABLET | ORAL | 0 refills | Status: DC

## 2022-02-02 MED ORDER — FUROSEMIDE 20 MG PO TABS: 20.0000 mg | ORAL_TABLET | Freq: Every day | ORAL | 0 refills | Status: DC | PRN

## 2022-02-03 ENCOUNTER — Encounter: Payer: Self-pay | Admitting: Physician Assistant

## 2022-02-03 ENCOUNTER — Encounter: Payer: Self-pay | Admitting: Internal Medicine

## 2022-02-03 NOTE — Progress Notes (Signed)
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     Plan: The patient has completed radiation treatment. The patient will return to radiation oncology clinic for routine followup in one month. I advised him to call or return sooner if he has any questions or concerns related to his recovery or treatment. ________________________________  Sheral Apley. Tammi Klippel, M.D.
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
## 2022-02-09 ENCOUNTER — Encounter: Payer: Self-pay | Admitting: Cardiovascular Disease

## 2022-02-09 ENCOUNTER — Ambulatory Visit: Payer: Medicare Other | Admitting: Cardiovascular Disease

## 2022-02-09 ENCOUNTER — Other Ambulatory Visit: Payer: Self-pay

## 2022-02-09 VITALS — BP 110/56 | HR 101 | Ht 71.0 in | Wt 142.8 lb

## 2022-02-09 DIAGNOSIS — I4819 Other persistent atrial fibrillation: Secondary | ICD-10-CM

## 2022-02-09 DIAGNOSIS — C7800 Secondary malignant neoplasm of unspecified lung: Secondary | ICD-10-CM

## 2022-02-09 DIAGNOSIS — E782 Mixed hyperlipidemia: Secondary | ICD-10-CM

## 2022-02-09 NOTE — Patient Instructions (Signed)
Medication Instructions:  Your physician has recommended you make the following change in your medication:  1-STOP Aspirin  *If you need a refill on your cardiac medications before your next appointment, please call your pharmacy*  Lab Work: Your physician recommends that you return for lab work in: 2 1/2 weeks for BMET and CBC  If you have labs (blood work) drawn today and your tests are completely normal, you will receive your results only by: [place] (if you have MyChart) OR A paper copy in the mail If you have any lab test that is abnormal or we need to change your treatment, we will call you to review the results.  Testing/Procedures: Your physician has recommended that you have a Cardioversion (DCCV). Electrical Cardioversion uses a jolt of electricity to your heart either through paddles or wired patches attached to your chest. This is a controlled, usually prescheduled, procedure. Defibrillation is done under light anesthesia in the hospital, and you usually go home the day of the procedure. This is done to get your heart back into a normal rhythm. You are not awake for the procedure. Please see the instruction sheet given to you today.  Follow-Up: At [facility], you and your health needs are our priority.  As part of our continuing mission to provide you with exceptional heart care, we have created designated Provider Care Teams.  These Care Teams include your primary Cardiologist (physician) and Advanced Practice Providers (APPs -  Physician Assistants and Nurse Practitioners) who all work together to provide you with the care you need, when you need it.  We recommend signing up for the patient portal called "MyChart".  Sign up information is provided on this After Visit Summary.  MyChart is used to connect with patients for Virtual Visits (Telemedicine).  Patients are able to view lab/test results, encounter notes, upcoming appointments, etc.  Non-urgent messages can be  sent to your provider as well.   To learn more about what you can do with MyChart, go to [email or web address].    Your next appointment:   4 weeks  The format for your next appointment:   In Person  Provider:   A. [facility] clinic   Other Instructions Your physician recommends that you schedule a follow-up appointment in: 6 months with Dr. Johnsie Cancel.   You are scheduled for a Cardioversion on 02/25/22 with Dr. Debara Pickett.  Please arrive at the [facility] (Main Entrance A) at [facility]: [address] [place], [place] [address] at 9:00 am.   DIET: Nothing to eat or drink after midnight except a sip of water with medications (see medication instructions below)  FYI: For your safety, and to allow [place] to monitor your vital signs accurately during the surgery/procedure we request that   if you have artificial nails, gel coating, SNS etc. Please have those removed prior to your surgery/procedure. Not having the nail coverings /polish removed may result in cancellation or delay of your surgery/procedure.  Medication Instructions: Continue your anticoagulant: Eliquis You will need to continue your anticoagulant after your procedure until you  are told by your  Provider that it is safe to stop  Labs:  Come to the lab at [facility] between the hours of 8:00 am and 4:30 pm. You do not have to be fasting.  You must have a responsible person to drive you home and stay in the waiting area during your procedure. Failure to do so could result in cancellation.  Bring your insurance cards.  *  Special Note: Every effort is made to have your procedure done on time. Occasionally there are emergencies that occur at the hospital that may cause delays. Please be patient if a delay does occur.

## 2022-02-12 ENCOUNTER — Other Ambulatory Visit: Payer: Self-pay | Admitting: Cardiovascular Disease

## 2022-02-12 DIAGNOSIS — I4819 Other persistent atrial fibrillation: Secondary | ICD-10-CM
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
## 2022-02-17 ENCOUNTER — Inpatient Hospital Stay (HOSPITAL_BASED_OUTPATIENT_CLINIC_OR_DEPARTMENT_OTHER): Payer: Medicare Other | Admitting: Physician Assistant

## 2022-02-17 ENCOUNTER — Encounter (HOSPITAL_COMMUNITY): Payer: Self-pay | Admitting: Internal Medicine

## 2022-02-17 ENCOUNTER — Inpatient Hospital Stay: Payer: Medicare Other | Attending: Internal Medicine

## 2022-02-17 ENCOUNTER — Other Ambulatory Visit: Payer: Self-pay

## 2022-02-17 ENCOUNTER — Inpatient Hospital Stay: Payer: Medicare Other

## 2022-02-17 ENCOUNTER — Inpatient Hospital Stay: Payer: Medicare Other | Admitting: Dietician

## 2022-02-17 ENCOUNTER — Other Ambulatory Visit: Payer: Self-pay | Admitting: Physician Assistant

## 2022-02-17 VITALS — BP 117/59 | HR 87 | Temp 97.7°F | Resp 16 | Wt 140.6 lb

## 2022-02-17 DIAGNOSIS — Z79899 Other long term (current) drug therapy: Secondary | ICD-10-CM | POA: Insufficient documentation

## 2022-02-17 DIAGNOSIS — C439 Malignant melanoma of skin, unspecified: Secondary | ICD-10-CM

## 2022-02-17 DIAGNOSIS — C7800 Secondary malignant neoplasm of unspecified lung: Secondary | ICD-10-CM

## 2022-02-17 DIAGNOSIS — Z5112 Encounter for antineoplastic immunotherapy: Secondary | ICD-10-CM | POA: Insufficient documentation

## 2022-02-17 DIAGNOSIS — E119 Type 2 diabetes mellitus without complications: Secondary | ICD-10-CM | POA: Insufficient documentation

## 2022-02-17 DIAGNOSIS — M25562 Pain in left knee: Secondary | ICD-10-CM | POA: Insufficient documentation

## 2022-02-17 DIAGNOSIS — I4891 Unspecified atrial fibrillation: Secondary | ICD-10-CM | POA: Diagnosis not present

## 2022-02-17 DIAGNOSIS — J90 Pleural effusion, not elsewhere classified: Secondary | ICD-10-CM | POA: Insufficient documentation

## 2022-02-17 DIAGNOSIS — C7801 Secondary malignant neoplasm of right lung: Secondary | ICD-10-CM | POA: Insufficient documentation

## 2022-02-17 LAB — CBC WITH DIFFERENTIAL (CANCER CENTER ONLY)
Abs Immature Granulocytes: 0.05 10*3/uL (ref 0.00–0.07)
Basophils Absolute: 0 10*3/uL (ref 0.0–0.1)
Basophils Relative: 1 %
Eosinophils Absolute: 0.7 10*3/uL — ABNORMAL HIGH (ref 0.0–0.5)
Eosinophils Relative: 12 %
HCT: 29 % — ABNORMAL LOW (ref 39.0–52.0)
Hemoglobin: 9.4 g/dL — ABNORMAL LOW (ref 13.0–17.0)
Immature Granulocytes: 1 %
Lymphocytes Relative: 17 %
Lymphs Abs: 1 10*3/uL (ref 0.7–4.0)
MCH: 30.5 pg (ref 26.0–34.0)
MCHC: 32.4 g/dL (ref 30.0–36.0)
MCV: 94.2 fL (ref 80.0–100.0)
Monocytes Absolute: 0.6 10*3/uL (ref 0.1–1.0)
Monocytes Relative: 10 %
Neutro Abs: 3.6 10*3/uL (ref 1.7–7.7)
Neutrophils Relative %: 59 %
Platelet Count: 261 10*3/uL (ref 150–400)
RBC: 3.08 MIL/uL — ABNORMAL LOW (ref 4.22–5.81)
RDW: 14.9 % (ref 11.5–15.5)
WBC Count: 6 10*3/uL (ref 4.0–10.5)
nRBC: 0 % (ref 0.0–0.2)

## 2022-02-17 LAB — CMP (CANCER CENTER ONLY)
ALT: 18 U/L (ref 0–44)
AST: 16 U/L (ref 15–41)
Albumin: 3.5 g/dL (ref 3.5–5.0)
Alkaline Phosphatase: 67 U/L (ref 38–126)
Anion gap: 8 (ref 5–15)
BUN: 24 mg/dL — ABNORMAL HIGH (ref 8–23)
CO2: 24 mmol/L (ref 22–32)
Calcium: 10.2 mg/dL (ref 8.9–10.3)
Chloride: 100 mmol/L (ref 98–111)
Creatinine: 0.81 mg/dL (ref 0.61–1.24)
GFR, Estimated: >60 mL/min (ref >60)
Glucose, Bld: 253 mg/dL — ABNORMAL HIGH (ref 70–99)
Potassium: 4.5 mmol/L (ref 3.5–5.1)
Sodium: 132 mmol/L — ABNORMAL LOW (ref 135–145)
Total Bilirubin: 0.4 mg/dL (ref 0.3–1.2)
Total Protein: 6.7 g/dL (ref 6.5–8.1)

## 2022-02-17 LAB — TSH: TSH: 2.685 u[IU]/mL (ref 0.320–4.118)

## 2022-02-17 MED ORDER — SODIUM CHLORIDE 0.9 % IV SOLN: Freq: Once | INTRAVENOUS | Status: AC

## 2022-02-17 MED ORDER — SODIUM CHLORIDE 0.9 % IV SOLN
1.0000 mg/kg | Freq: Once | INTRAVENOUS | Status: AC
  Administered 2022-02-17: 70 mg via INTRAVENOUS
  Filled 2022-02-17: qty 7

## 2022-02-17 MED ORDER — SODIUM CHLORIDE 0.9 % IV SOLN
3.0000 mg/kg | Freq: Once | INTRAVENOUS | Status: AC
  Administered 2022-02-17: 200 mg via INTRAVENOUS
  Filled 2022-02-17: qty 40

## 2022-02-17 MED ORDER — DIPHENHYDRAMINE HCL 50 MG/ML IJ SOLN
25.0000 mg | Freq: Once | INTRAMUSCULAR | Status: AC
  Administered 2022-02-17: 25 mg via INTRAVENOUS
  Filled 2022-02-17: qty 1

## 2022-02-17 MED ORDER — FAMOTIDINE IN NACL 20-0.9 MG/50ML-% IV SOLN
20.0000 mg | Freq: Once | INTRAVENOUS | Status: AC
  Administered 2022-02-17: 20 mg via INTRAVENOUS
  Filled 2022-02-17: qty 50

## 2022-02-17 NOTE — Patient Instructions (Signed)
[facility]  Discharge Instructions: Thank you for choosing [facility] to provide your oncology and hematology care.   If you have a lab appointment with the [facility], please go directly to the [facility] and check in at the registration area.   Wear comfortable clothing and clothing appropriate for easy access to any Portacath or PICC line.   We strive to give you quality time with your provider. You may need to reschedule your appointment if you arrive late (15 or more minutes).  Arriving late affects you and other patients whose appointments are after yours.  Also, if you miss three or more appointments without notifying the office, you may be dismissed from the clinic at the providers discretion.      For prescription refill requests, have your pharmacy contact our office and allow 72 hours for refills to be completed.    Today you received the following chemotherapy and/or immunotherapy agents: Opdivo(nivolumab) and yervoy (Ipilimumab)   To help prevent nausea and vomiting after your treatment, we encourage you to take your nausea medication as directed.  BELOW ARE SYMPTOMS THAT SHOULD BE REPORTED IMMEDIATELY: *FEVER GREATER THAN 100.4 F (38 C) OR HIGHER *CHILLS OR SWEATING *NAUSEA AND VOMITING THAT IS NOT CONTROLLED WITH YOUR NAUSEA MEDICATION *UNUSUAL SHORTNESS OF BREATH *UNUSUAL BRUISING OR BLEEDING *URINARY PROBLEMS (pain or burning when urinating, or frequent urination) *BOWEL PROBLEMS (unusual diarrhea, constipation, pain near the anus) TENDERNESS IN MOUTH AND THROAT WITH OR WITHOUT PRESENCE OF ULCERS (sore throat, sores in mouth, or a toothache) UNUSUAL RASH, SWELLING OR PAIN  UNUSUAL VAGINAL DISCHARGE OR ITCHING   Items with * indicate a potential emergency and should be followed up as soon as possible or go to the Emergency Department if any problems should occur.  Please show the CHEMOTHERAPY ALERT CARD or  IMMUNOTHERAPY ALERT CARD at check-in to the Emergency Department and triage nurse.  Should you have questions after your visit or need to cancel or reschedule your appointment, please contact [facility]  Dept: [phone]  and follow the prompts.  Office hours are 8:00 a.m. to 4:30 p.m. Monday - Friday. Please note that voicemails left after 4:00 p.m. may not be returned until the following business day.  We are closed weekends and major holidays. You have access to a nurse at all times for urgent questions. Please call the main number to the clinic Dept: [phone] and follow the prompts.   For any non-urgent questions, you may also contact your provider using MyChart. We now offer e-Visits for anyone 64 and older to request care online for non-urgent symptoms. For details visit [email or web address].   Also download the MyChart app! Go to the app store, search "MyChart", open the app, select [facility], and log in with your MyChart username and password.  Due to Covid, a mask is required upon entering the hospital/clinic. If you do not have a mask, one will be given to you upon arrival. For doctor visits, patients may have 1 support person aged 52 or older with them. For treatment visits, patients cannot have anyone with them due to current Covid guidelines and our immunocompromised population.   Nivolumab injection What is this medication? NIVOLUMAB (nye VOL ue mab) is a monoclonal antibody. It treats certain types of cancer. Some of the cancers treated are colon cancer, head and neck cancer, Hodgkin lymphoma, lung cancer, and melanoma. This medicine may be used for other purposes; ask your  health care provider or pharmacist if you have questions. COMMON BRAND NAME(S): Opdivo What should I tell my care team before I take this medication? They need to know if you have any of these conditions: Autoimmune diseases such as Crohn's disease, ulcerative colitis, or  lupus Have had or planning to have an allogeneic stem cell transplant (uses someone else's stem cells) History of chest radiation Organ transplant Nervous system problems such as myasthenia gravis or Guillain-Barre syndrome An unusual or allergic reaction to nivolumab, other medicines, foods, dyes, or preservatives Pregnant or trying to get pregnant Breast-feeding How should I use this medication? This medication is injected into a vein. It is given in a hospital or clinic setting. A special MedGuide will be given to you before each treatment. Be sure to read this information carefully each time. Talk to your care team regarding the use of this medication in children. While it may be prescribed for children as young as 12 years for selected conditions, precautions do apply. Overdosage: If you think you have taken too much of this medicine contact a poison control center or emergency room at once. NOTE: This medicine is only for you. Do not share this medicine with others. What if I miss a dose? Keep appointments for follow-up doses. It is important not to miss your dose. Call your care team if you are unable to keep an appointment. What may interact with this medication? Interactions have not been studied. This list may not describe all possible interactions. Give your health care provider a list of all the medicines, herbs, non-prescription drugs, or dietary supplements you use. Also tell them if you smoke, drink alcohol, or use illegal drugs. Some items may interact with your medicine. What should I watch for while using this medication? Your condition will be monitored carefully while you are receiving this medication. You may need blood work done while you are taking this medication. Do not become pregnant while taking this medication or for 5 months after stopping it. Women should inform their care team if they wish to become pregnant or think they might be pregnant. There is a potential  for serious harm to an unborn child. Talk to your care team for more information. Do not breast-feed an infant while taking this medication or for 5 months after stopping it. What side effects may I notice from receiving this medication? Side effects that you should report to your care team as soon as possible: Allergic reactions--skin rash, itching, hives, swelling of the face, lips, tongue, or throat Bloody or black, tar-like stools Change in vision Chest pain Diarrhea Dry cough, shortness of breath or trouble breathing Eye pain Fast or irregular heartbeat Fever, chills High blood sugar (hyperglycemia)--increased thirst or amount of urine, unusual weakness or fatigue, blurry vision High thyroid levels (hyperthyroidism)--fast or irregular heartbeat, weight loss, excessive sweating or sensitivity to heat, tremors or shaking, anxiety, nervousness, irregular menstrual cycle or spotting Kidney injury--decrease in the amount of urine, swelling of the ankles, hands, or feet Liver injury--right upper belly pain, loss of appetite, nausea, light-colored stool, dark yellow or brown urine, yellowing skin or eyes, unusual weakness or fatigue Low red blood cell count--unusual weakness or fatigue, dizziness, headache, trouble breathing Low thyroid levels (hypothyroidism)--unusual weakness or fatigue, increased sensitivity to cold, constipation, hair loss, dry skin, weight gain, feelings of depression Mood and behavior changes-confusion, change in sex drive or performance, irritability Muscle pain or cramps Pain, tingling, or numbness in the hands or feet, muscle weakness, trouble  walking, loss of balance or coordination Red or dark brown urine Redness, blistering, peeling, or loosening of the skin, including inside the mouth Stomach pain Unusual bruising or bleeding Side effects that usually do not require medical attention (report to your care team if they continue or are bothersome): Bone  pain Constipation Loss of appetite Nausea Tiredness Vomiting This list may not describe all possible side effects. Call your doctor for medical advice about side effects. You may report side effects to FDA at 1-800-FDA-1088. Where should I keep my medication? This medication is given in a hospital or clinic and will not be stored at home. NOTE: This sheet is a summary. It may not cover all possible information. If you have questions about this medicine, talk to your doctor, pharmacist, or health care provider.  2022 Elsevier/Gold Standard (2021-08-18 00:00:00)  Ipilimumab injection What is this medication? IPILIMUMAB (IP i LIM ue mab) is a monoclonal antibody. It treats colorectal cancer, esophageal cancer, kidney cancer, liver cancer, lung cancer, melanoma, and mesothelioma. This medicine may be used for other purposes; ask your health care provider or pharmacist if you have questions. COMMON BRAND NAME(S): YERVOY What should I tell my care team before I take this medication? They need to know if you have any of these conditions: autoimmune diseases like Crohn's disease, ulcerative colitis, or lupus have had or planning to have an allogeneic stem cell transplant (uses someone else's stem cells) history of organ transplant nervous system problems like myasthenia gravis or Guillain-Barre syndrome an unusual or allergic reaction to ipilimumab, other medicines, foods, dyes, or preservatives pregnant or trying to get pregnant breast-feeding How should I use this medication? This medicine is for infusion into a vein. It is given by a health care professional in a hospital or clinic setting. A special MedGuide will be given to you before each treatment. Be sure to read this information carefully each time. Talk to your pediatrician regarding the use of this medicine in children. While this drug may be prescribed for children as young as 12 years for selected conditions, precautions do  apply. Overdosage: If you think you have taken too much of this medicine contact a poison control center or emergency room at once. NOTE: This medicine is only for you. Do not share this medicine with others. What if I miss a dose? It is important not to miss your dose. Call your doctor or health care professional if you are unable to keep an appointment. What may interact with this medication? Interactions are not expected. This list may not describe all possible interactions. Give your health care provider a list of all the medicines, herbs, non-prescription drugs, or dietary supplements you use. Also tell them if you smoke, drink alcohol, or use illegal drugs. Some items may interact with your medicine. What should I watch for while using this medication? Tell your doctor or healthcare professional if your symptoms do not start to get better or if they get worse. Do not become pregnant while taking this medicine or for 3 months after stopping it. Women should inform their doctor if they wish to become pregnant or think they might be pregnant. There is a potential for serious side effects to an unborn child. Talk to your health care professional or pharmacist for more information. Do not breast-feed an infant while taking this medicine or for 3 months after the last dose. Your condition will be monitored carefully while you are receiving this medicine. You may need blood work done while  you are taking this medicine. What side effects may I notice from receiving this medication? Side effects that you should report to your doctor or health care professional as soon as possible: allergic reactions like skin rash, itching or hives, swelling of the face, lips, or tongue black, tarry stools bloody or watery diarrhea changes in vision dizziness eye pain fast, irregular heartbeat feeling anxious feeling faint or lightheaded, falls nausea, vomiting pain, tingling, numbness in the hands or  feet redness, blistering, peeling or loosening of the skin, including inside the mouth signs and symptoms of liver injury like dark yellow or brown urine; general ill feeling or flu-like symptoms; light-colored stools; loss of appetite; nausea; right upper belly pain; unusually weak or tired; yellowing of the eyes or skin unusual bleeding or bruising Side effects that usually do not require medical attention (report to your doctor or health care professional if they continue or are bothersome): headache loss of appetite trouble sleeping This list may not describe all possible side effects. Call your doctor for medical advice about side effects. You may report side effects to FDA at 1-800-FDA-1088. Where should I keep my medication? This drug is given in a hospital or clinic and will not be stored at home. NOTE: This sheet is a summary. It may not cover all possible information. If you have questions about this medicine, talk to your doctor, pharmacist, or health care provider.  2022 Elsevier/Gold Standard (2021-08-18 00:00:00)

## 2022-02-17 NOTE — Progress Notes (Signed)
Ipilimumab (YERVOY) Patient Monitoring Assessment  ? ?Is the patient experiencing any of the following general symptoms?:  ?[] Difficulty performing normal activities ?[] Feeling sluggish or cold all the time ?[] Unusual weight gain ?[] Constant or unusual headaches ?[] Feeling dizzy or faint ?[] Changes in eyesight (blurry vision, double vision, or other vision problems) ?[] Changes in mood or behavior (ex: decreased sex drive, irritability, or forgetfulness) ?[] Starting new medications (ex: steroids, other medications that lower immune response) ?[x] Patient is not experiencing any of the general symptoms above.  ? ? ?Gastrointestinal  ?Patient is having 1 bowel movements each day.  ?Is this different from baseline? [x] Yes [] No ?Are your stools watery or do they have a foul smell? [] Yes [x] No ?Have you seen blood in your stools? [] Yes [x] No ?Are your stools dark, tarry, or sticky? [] Yes [x] No ?Are you having pain or tenderness in your belly? [] Yes [x] No ? ?Skin ?Does your skin itch? [x] Yes [] No ?Do you have a rash? [x] Yes [] No ?Has your skin blistered and/or peeled? [] Yes [x] No ?Do you have sores in your mouth? [] Yes [x] No ? ?Hepatic ?Has your urine been dark or tea colored? [] Yes [x] No ?Have you noticed that your skin or the whites of your eyes are turning yellow? [] Yes [x] No ?Are you bleeding or bruising more easily than normal? [] Yes [x] No ?Are you nauseous and/or vomiting? [] Yes [x] No ?Do you have pain on the right side of your stomach? [] Yes [x] No ? ?Neurologic  ?Are you having unusual weakness of legs, arms, or face? [] Yes [x] No ?Are you having numbness or tingling in your hands or feet? [] Yes [x] No ? ?[name] [name]  ?

## 2022-02-17 NOTE — Progress Notes (Signed)
Nutrition Assessment ? ? ?Reason for Assessment: Hospital f/u  ? ? 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 He is currently receiving immunotherapy with nivolumab + Ipilimumab q21d ? ?Past medical history includes CHF, PAF, DM2, HLD ?2/3-2/6 hospital admission for CAP ? ?Met with patient during infusion. He reports appetite is improving. Patient is eating more at meal times. Yesterday he had oatmeal, 8" sub sandwich, chick fila sandwich, and drank 2 Ensure. He denies nausea, vomiting, diarrhea. Patient has mild constipation. He takes Dulcolax as needed.  ? ?Nutrition Focused Physical Exam:  ?Severe fat depletion - orbital, buccal ?Severe muscle depletion - temple, dorsal hand ? ? ?Medications: Remeron, B12, eliquis, glimeperide, metformin, zinc, compazine ? ? ?Labs: Na 132, Glucose 253, BUN 24 ? ? ?Anthropometrics: Patient is 17.6% under his reported usual weight. Weight has decreased 5.4% in the last month. Per chart, pt weighed 148 lb 3.2 oz on 2/3. This is significant for time frame ? ?Height: 5'11" ?Weight: 140 lb 9.6 oz  ?UBW: 170-175 lb (per pt ~4 months ago) ?BMI: 19.61 ? ? ? 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 ? ?INTERVENTION:  ?Continue eating high calorie high protein foods ?Encouraged snacks in between meals and at bedtime - handout with ideas provided ?Continue drinking 2 Ensure Plus/equivalent daily - coupons provided ?Continue appetite stimulant per MD ? ? ?MONITORING, EVALUATION, GOAL: Patient will tolerate increased calories and protein to promote weight gain/stability ? ? ?Next Visit: Thursday March 30 during infusion with Pamala Hurry ? ? ? ? ? ? ?

## 2022-02-18 ENCOUNTER — Encounter: Payer: Self-pay | Admitting: Physician Assistant

## 2022-02-18 ENCOUNTER — Encounter: Payer: Self-pay | Admitting: Internal Medicine

## 2022-02-22 ENCOUNTER — Other Ambulatory Visit: Payer: Self-pay | Admitting: *Deleted

## 2022-02-22 DIAGNOSIS — R21 Rash and other nonspecific skin eruption: Secondary | ICD-10-CM

## 2022-02-22 MED ORDER — HYDROCORTISONE 2.5 % EX CREA: TOPICAL_CREAM | Freq: Two times a day (BID) | CUTANEOUS | 0 refills | Status: AC

## 2022-02-23 ENCOUNTER — Other Ambulatory Visit: Payer: Self-pay

## 2022-02-23 ENCOUNTER — Other Ambulatory Visit: Payer: Medicare Other

## 2022-02-23 DIAGNOSIS — E782 Mixed hyperlipidemia: Secondary | ICD-10-CM

## 2022-02-23 DIAGNOSIS — I4819 Other persistent atrial fibrillation: Secondary | ICD-10-CM

## 2022-02-23 DIAGNOSIS — C7800 Secondary malignant neoplasm of unspecified lung: Secondary | ICD-10-CM

## 2022-02-24 ENCOUNTER — Encounter: Payer: Self-pay | Admitting: Internal Medicine

## 2022-02-24 LAB — BASIC METABOLIC PANEL
BUN/Creatinine Ratio: 27 — ABNORMAL HIGH (ref 10–24)
BUN: 23 mg/dL (ref 8–27)
CO2: 20 mmol/L (ref 20–29)
Calcium: 9.6 mg/dL (ref 8.6–10.2)
Chloride: 99 mmol/L (ref 96–106)
Creatinine, Ser: 0.86 mg/dL (ref 0.76–1.27)
Glucose: 147 mg/dL — ABNORMAL HIGH (ref 70–99)
Potassium: 4.5 mmol/L (ref 3.5–5.2)
Sodium: 135 mmol/L (ref 134–144)
eGFR: 89 mL/min/{1.73_m2} (ref >59)

## 2022-02-24 LAB — CBC WITH DIFFERENTIAL/PLATELET
Basophils Absolute: 0 10*3/uL (ref 0.0–0.2)
Basos: 1 %
EOS (ABSOLUTE): 0.7 10*3/uL — ABNORMAL HIGH (ref 0.0–0.4)
Eos: 13 %
Hematocrit: 29 % — ABNORMAL LOW (ref 37.5–51.0)
Hemoglobin: 9.9 g/dL — ABNORMAL LOW (ref 13.0–17.7)
Immature Grans (Abs): 0 10*3/uL (ref 0.0–0.1)
Immature Granulocytes: 1 %
Lymphocytes Absolute: 1.5 10*3/uL (ref 0.7–3.1)
Lymphs: 26 %
MCH: 31.2 pg (ref 26.6–33.0)
MCHC: 34.1 g/dL (ref 31.5–35.7)
MCV: 92 fL (ref 79–97)
Monocytes Absolute: 0.8 10*3/uL (ref 0.1–0.9)
Monocytes: 14 %
Neutrophils Absolute: 2.6 10*3/uL (ref 1.4–7.0)
Neutrophils: 45 %
Platelets: 271 10*3/uL (ref 150–450)
RBC: 3.17 x10E6/uL — ABNORMAL LOW (ref 4.14–5.80)
RDW: 13.9 % (ref 11.6–15.4)
WBC: 5.6 10*3/uL (ref 3.4–10.8)

## 2022-02-25 ENCOUNTER — Ambulatory Visit (HOSPITAL_COMMUNITY)
Admission: RE | Admit: 2022-02-25 | Discharge: 2022-02-25 | Disposition: A | Discharge status: Home / Self Care | Payer: Medicare Other | Attending: Internal Medicine | Admitting: Internal Medicine

## 2022-02-25 ENCOUNTER — Encounter (HOSPITAL_COMMUNITY)
Admission: RE | Disposition: A | Discharge status: Home / Self Care | Payer: Self-pay | Source: Home / Self Care | Attending: Internal Medicine

## 2022-02-25 DIAGNOSIS — Z538 Procedure and treatment not carried out for other reasons: Secondary | ICD-10-CM | POA: Insufficient documentation

## 2022-02-25 DIAGNOSIS — I4819 Other persistent atrial fibrillation: Secondary | ICD-10-CM | POA: Diagnosis present

## 2022-02-25 SURGERY — CANCELLED PROCEDURE

## 2022-02-25 MED ORDER — SODIUM CHLORIDE 0.9 % IV SOLN: INTRAVENOUS | Status: DC

## 2022-02-25 NOTE — Progress Notes (Signed)
Patient admitted to ENDO for cardioversion. Patient placed on monitor and appeared to be in NSR. EKG obtained and confirmed by MD to be NSR. Patient instructed to continue taking his Eliquis and not miss a dose and keep any follow-up with Cardiology. Patient ambulated to spouse and discharged ?

## 2022-03-10 ENCOUNTER — Ambulatory Visit (HOSPITAL_COMMUNITY)
Admission: RE | Admit: 2022-03-10 | Discharge: 2022-03-10 | Disposition: A | Discharge status: Home / Self Care | Payer: Medicare Other | Source: Ambulatory Visit | Attending: Nurse Practitioner | Admitting: Nurse Practitioner

## 2022-03-10 ENCOUNTER — Other Ambulatory Visit: Payer: Self-pay

## 2022-03-10 ENCOUNTER — Encounter (HOSPITAL_COMMUNITY): Payer: Self-pay | Admitting: Nurse Practitioner

## 2022-03-10 VITALS — BP 156/76 | HR 76 | Ht 71.0 in | Wt 148.0 lb

## 2022-03-10 DIAGNOSIS — Z7901 Long term (current) use of anticoagulants: Secondary | ICD-10-CM | POA: Diagnosis not present

## 2022-03-10 DIAGNOSIS — I4819 Other persistent atrial fibrillation: Secondary | ICD-10-CM

## 2022-03-10 DIAGNOSIS — I480 Paroxysmal atrial fibrillation: Secondary | ICD-10-CM | POA: Diagnosis present

## 2022-03-10 DIAGNOSIS — Z79899 Other long term (current) drug therapy: Secondary | ICD-10-CM | POA: Diagnosis not present

## 2022-03-10 DIAGNOSIS — C799 Secondary malignant neoplasm of unspecified site: Secondary | ICD-10-CM | POA: Diagnosis not present

## 2022-03-10 DIAGNOSIS — D6869 Other thrombophilia: Secondary | ICD-10-CM | POA: Diagnosis not present
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
## 2022-03-11 ENCOUNTER — Other Ambulatory Visit: Payer: Self-pay | Admitting: *Deleted

## 2022-03-11 ENCOUNTER — Inpatient Hospital Stay: Payer: Medicare Other

## 2022-03-11 ENCOUNTER — Inpatient Hospital Stay: Payer: Medicare Other | Admitting: Nutrition

## 2022-03-11 ENCOUNTER — Ambulatory Visit: Payer: Medicare Other | Admitting: Internal Medicine

## 2022-03-11 ENCOUNTER — Other Ambulatory Visit: Payer: Medicare Other

## 2022-03-11 VITALS — BP 125/55 | HR 75 | Temp 97.7°F | Resp 16 | Wt 148.2 lb

## 2022-03-11 DIAGNOSIS — Z5112 Encounter for antineoplastic immunotherapy: Secondary | ICD-10-CM | POA: Diagnosis not present

## 2022-03-11 DIAGNOSIS — C7800 Secondary malignant neoplasm of unspecified lung: Secondary | ICD-10-CM

## 2022-03-11 DIAGNOSIS — C7801 Secondary malignant neoplasm of right lung: Secondary | ICD-10-CM

## 2022-03-11 LAB — CMP (CANCER CENTER ONLY)
ALT: 16 U/L (ref 0–44)
AST: 15 U/L (ref 15–41)
Albumin: 3.6 g/dL (ref 3.5–5.0)
Alkaline Phosphatase: 55 U/L (ref 38–126)
Anion gap: 8 (ref 5–15)
BUN: 22 mg/dL (ref 8–23)
CO2: 23 mmol/L (ref 22–32)
Calcium: 9.6 mg/dL (ref 8.9–10.3)
Chloride: 102 mmol/L (ref 98–111)
Creatinine: 0.85 mg/dL (ref 0.61–1.24)
GFR, Estimated: >60 mL/min (ref >60)
Glucose, Bld: 204 mg/dL — ABNORMAL HIGH (ref 70–99)
Potassium: 4.4 mmol/L (ref 3.5–5.1)
Sodium: 133 mmol/L — ABNORMAL LOW (ref 135–145)
Total Bilirubin: 0.3 mg/dL (ref 0.3–1.2)
Total Protein: 6.8 g/dL (ref 6.5–8.1)

## 2022-03-11 LAB — CBC WITH DIFFERENTIAL (CANCER CENTER ONLY)
Abs Immature Granulocytes: 0.02 10*3/uL (ref 0.00–0.07)
Basophils Absolute: 0 10*3/uL (ref 0.0–0.1)
Basophils Relative: 1 %
Eosinophils Absolute: 0.5 10*3/uL (ref 0.0–0.5)
Eosinophils Relative: 11 %
HCT: 27.2 % — ABNORMAL LOW (ref 39.0–52.0)
Hemoglobin: 9 g/dL — ABNORMAL LOW (ref 13.0–17.0)
Immature Granulocytes: 1 %
Lymphocytes Relative: 29 %
Lymphs Abs: 1.3 10*3/uL (ref 0.7–4.0)
MCH: 31.1 pg (ref 26.0–34.0)
MCHC: 33.1 g/dL (ref 30.0–36.0)
MCV: 94.1 fL (ref 80.0–100.0)
Monocytes Absolute: 0.5 10*3/uL (ref 0.1–1.0)
Monocytes Relative: 12 %
Neutro Abs: 2.1 10*3/uL (ref 1.7–7.7)
Neutrophils Relative %: 46 %
Platelet Count: 200 10*3/uL (ref 150–400)
RBC: 2.89 MIL/uL — ABNORMAL LOW (ref 4.22–5.81)
RDW: 15.4 % (ref 11.5–15.5)
WBC Count: 4.4 10*3/uL (ref 4.0–10.5)
nRBC: 0 % (ref 0.0–0.2)

## 2022-03-11 LAB — TSH: TSH: 2.699 u[IU]/mL (ref 0.320–4.118)

## 2022-03-11 MED ORDER — SODIUM CHLORIDE 0.9 % IV SOLN
480.0000 mg | Freq: Once | INTRAVENOUS | Status: AC
  Administered 2022-03-11: 480 mg via INTRAVENOUS
  Filled 2022-03-11: qty 48

## 2022-03-11 MED ORDER — SODIUM CHLORIDE 0.9 % IV SOLN: Freq: Once | INTRAVENOUS | Status: AC

## 2022-03-11 NOTE — Patient Instructions (Signed)
[facility]  Discharge Instructions: ?Thank you for choosing [facility] to provide your oncology and hematology care.  ? ?If you have a lab appointment with the [facility], please go directly to the [facility] and check in at the registration area. ?  ?Wear comfortable clothing and clothing appropriate for easy access to any Portacath or PICC line.  ? ?We strive to give you quality time with your provider. You may need to reschedule your appointment if you arrive late (15 or more minutes).  Arriving late affects you and other patients whose appointments are after yours.  Also, if you miss three or more appointments without notifying the office, you may be dismissed from the clinic at the provider?s discretion.    ?  ?For prescription refill requests, have your pharmacy contact our office and allow 72 hours for refills to be completed.   ? ?Today you received the following chemotherapy and/or immunotherapy agents: Opdivo(nivolumab) and yervoy (Ipilimumab) ?  ?To help prevent nausea and vomiting after your treatment, we encourage you to take your nausea medication as directed. ? ?BELOW ARE SYMPTOMS THAT SHOULD BE REPORTED IMMEDIATELY: ?*FEVER GREATER THAN 100.4 F (38 ?C) OR HIGHER ?*CHILLS OR SWEATING ?*NAUSEA AND VOMITING THAT IS NOT CONTROLLED WITH YOUR NAUSEA MEDICATION ?*UNUSUAL SHORTNESS OF BREATH ?*UNUSUAL BRUISING OR BLEEDING ?*URINARY PROBLEMS (pain or burning when urinating, or frequent urination) ?*BOWEL PROBLEMS (unusual diarrhea, constipation, pain near the anus) ?TENDERNESS IN MOUTH AND THROAT WITH OR WITHOUT PRESENCE OF ULCERS (sore throat, sores in mouth, or a toothache) ?UNUSUAL RASH, SWELLING OR PAIN  ?UNUSUAL VAGINAL DISCHARGE OR ITCHING  ? ?Items with * indicate a potential emergency and should be followed up as soon as possible or go to the Emergency Department if any problems should occur. ? ?Please show the CHEMOTHERAPY ALERT CARD or  IMMUNOTHERAPY ALERT CARD at check-in to the Emergency Department and triage nurse. ? ?Should you have questions after your visit or need to cancel or reschedule your appointment, please contact [facility]  Dept: [phone]  and follow the prompts.  Office hours are 8:00 a.m. to 4:30 p.m. Monday - Friday. Please note that voicemails left after 4:00 p.m. may not be returned until the following business day.  We are closed weekends and major holidays. You have access to a nurse at all times for urgent questions. Please call the main number to the clinic Dept: [phone] and follow the prompts. ? ? ?For any non-urgent questions, you may also contact your provider using MyChart. We now offer e-Visits for anyone 44 and older to request care online for non-urgent symptoms. For details visit [email or web address]. ?  ?Also download the MyChart app! Go to the app store, search "MyChart", open the app, select [facility], and log in with your MyChart username and password. ? ?Due to Covid, a mask is required upon entering the hospital/clinic. If you do not have a mask, one will be given to you upon arrival. For doctor visits, patients may have 1 support person aged 55 or older with them. For treatment visits, patients cannot have anyone with them due to current Covid guidelines and our immunocompromised population.  ? ?Nivolumab injection ?What is this medication? ?NIVOLUMAB (nye VOL ue mab) is a monoclonal antibody. It treats certain types of cancer. Some of the cancers treated are colon cancer, head and neck cancer, Hodgkin lymphoma, lung cancer, and melanoma. ?This medicine may be used for other purposes; ask your  health care provider or pharmacist if you have questions. ?COMMON BRAND NAME(S): Opdivo ?What should I tell my care team before I take this medication? ?They need to know if you have any of these conditions: ?Autoimmune diseases such as Crohn's disease, ulcerative colitis, or  lupus ?Have had or planning to have an allogeneic stem cell transplant (uses someone else's stem cells) ?History of chest radiation ?Organ transplant ?Nervous system problems such as myasthenia gravis or Guillain-Barre syndrome ?An unusual or allergic reaction to nivolumab, other medicines, foods, dyes, or preservatives ?Pregnant or trying to get pregnant ?Breast-feeding ?How should I use this medication? ?This medication is injected into a vein. It is given in a hospital or clinic setting. ?A special MedGuide will be given to you before each treatment. Be sure to read this information carefully each time. ?Talk to your care team regarding the use of this medication in children. While it may be prescribed for children as young as 12 years for selected conditions, precautions do apply. ?Overdosage: If you think you have taken too much of this medicine contact a poison control center or emergency room at once. ?NOTE: This medicine is only for you. Do not share this medicine with others. ?What if I miss a dose? ?Keep appointments for follow-up doses. It is important not to miss your dose. Call your care team if you are unable to keep an appointment. ?What may interact with this medication? ?Interactions have not been studied. ?This list may not describe all possible interactions. Give your health care provider a list of all the medicines, herbs, non-prescription drugs, or dietary supplements you use. Also tell them if you smoke, drink alcohol, or use illegal drugs. Some items may interact with your medicine. ?What should I watch for while using this medication? ?Your condition will be monitored carefully while you are receiving this medication. ?You may need blood work done while you are taking this medication. ?Do not become pregnant while taking this medication or for 5 months after stopping it. Women should inform their care team if they wish to become pregnant or think they might be pregnant. There is a potential  for serious harm to an unborn child. Talk to your care team for more information. Do not breast-feed an infant while taking this medication or for 5 months after stopping it. ?What side effects may I notice from receiving this medication? ?Side effects that you should report to your care team as soon as possible: ?Allergic reactions--skin rash, itching, hives, swelling of the face, lips, tongue, or throat ?Bloody or black, tar-like stools ?Change in vision ?Chest pain ?Diarrhea ?Dry cough, shortness of breath or trouble breathing ?Eye pain ?Fast or irregular heartbeat ?Fever, chills ?High blood sugar (hyperglycemia)--increased thirst or amount of urine, unusual weakness or fatigue, blurry vision ?High thyroid levels (hyperthyroidism)--fast or irregular heartbeat, weight loss, excessive sweating or sensitivity to heat, tremors or shaking, anxiety, nervousness, irregular menstrual cycle or spotting ?Kidney injury--decrease in the amount of urine, swelling of the ankles, hands, or feet ?Liver injury--right upper belly pain, loss of appetite, nausea, light-colored stool, dark yellow or brown urine, yellowing skin or eyes, unusual weakness or fatigue ?Low red blood cell count--unusual weakness or fatigue, dizziness, headache, trouble breathing ?Low thyroid levels (hypothyroidism)--unusual weakness or fatigue, increased sensitivity to cold, constipation, hair loss, dry skin, weight gain, feelings of depression ?Mood and behavior changes-confusion, change in sex drive or performance, irritability ?Muscle pain or cramps ?Pain, tingling, or numbness in the hands or feet, muscle weakness, trouble  walking, loss of balance or coordination ?Red or dark brown urine ?Redness, blistering, peeling, or loosening of the skin, including inside the mouth ?Stomach pain ?Unusual bruising or bleeding ?Side effects that usually do not require medical attention (report to your care team if they continue or are bothersome): ?Bone  pain ?Constipation ?Loss of appetite ?Nausea ?Tiredness ?Vomiting ?This list may not describe all possible side effects. Call your doctor for medical advice about side effects. You may report side effects to FDA at 1-800-FDA

## 2022-03-11 NOTE — Progress Notes (Signed)
Nutrition follow up completed with patient in the infusion room. Patient is receiving immunotherapy with nivolumab and Ipilimumab every 21 days for metastatic malignant melanoma. ? ?Weight increased to 148 pounds on 3/30, from 140 pounds 02-17-22. ? ?Labs include Na 133 and glucose 204. ? ?Patient reports his appetite is increased. He is eating more food and eating more often. He is drinking a variety of nutrition supplements. He is pleased with weight gain. He denies nutrition impact symptoms. ? ?Nutrition Diagnosis: ?Severe malnutrition is stable. ? ?Intervention: ?Educated patient to continue small frequent meals and snacks with adequate calories and protein. ?Continue oral nutrition supplements, at least 2 daily. ?Contact RD with questions. ? ?Monitoring, Evaluation, Goals: ?Patient will tolerate adequate calories and protein to minimize wt loss. ? ?Next Visit: ?Wednesday, May 26, with Vinnie Level, during infusion. ?

## 2022-03-17 ENCOUNTER — Other Ambulatory Visit: Payer: Self-pay

## 2022-03-17 ENCOUNTER — Encounter: Payer: Self-pay | Admitting: Internal Medicine

## 2022-03-17 ENCOUNTER — Other Ambulatory Visit: Payer: Medicare Other

## 2022-03-17 ENCOUNTER — Inpatient Hospital Stay: Payer: Medicare Other | Attending: Internal Medicine | Admitting: Internal Medicine

## 2022-03-17 VITALS — BP 115/62 | HR 78 | Temp 97.4°F | Resp 18 | Ht 71.0 in | Wt 144.8 lb

## 2022-03-17 DIAGNOSIS — C439 Malignant melanoma of skin, unspecified: Secondary | ICD-10-CM | POA: Diagnosis present

## 2022-03-17 DIAGNOSIS — C7800 Secondary malignant neoplasm of unspecified lung: Secondary | ICD-10-CM | POA: Diagnosis not present

## 2022-03-17 DIAGNOSIS — J90 Pleural effusion, not elsewhere classified: Secondary | ICD-10-CM | POA: Diagnosis not present

## 2022-03-17 DIAGNOSIS — R590 Localized enlarged lymph nodes: Secondary | ICD-10-CM | POA: Insufficient documentation

## 2022-03-17 DIAGNOSIS — E119 Type 2 diabetes mellitus without complications: Secondary | ICD-10-CM | POA: Insufficient documentation

## 2022-03-17 DIAGNOSIS — Z5112 Encounter for antineoplastic immunotherapy: Secondary | ICD-10-CM

## 2022-03-17 DIAGNOSIS — Z79899 Other long term (current) drug therapy: Secondary | ICD-10-CM | POA: Diagnosis not present
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
## 2022-03-18 ENCOUNTER — Encounter: Payer: Self-pay | Admitting: Internal Medicine

## 2022-03-19 ENCOUNTER — Other Ambulatory Visit: Payer: Self-pay | Admitting: Radiology

## 2022-03-22 ENCOUNTER — Ambulatory Visit (HOSPITAL_COMMUNITY)
Admission: RE | Admit: 2022-03-22 | Discharge: 2022-03-22 | Disposition: A | Discharge status: Home / Self Care | Payer: Medicare Other | Source: Ambulatory Visit | Attending: Internal Medicine | Admitting: Internal Medicine

## 2022-03-22 ENCOUNTER — Other Ambulatory Visit: Payer: Self-pay

## 2022-03-22 ENCOUNTER — Encounter (HOSPITAL_COMMUNITY): Payer: Self-pay

## 2022-03-22 DIAGNOSIS — E785 Hyperlipidemia, unspecified: Secondary | ICD-10-CM | POA: Diagnosis not present

## 2022-03-22 DIAGNOSIS — I509 Heart failure, unspecified: Secondary | ICD-10-CM | POA: Diagnosis not present

## 2022-03-22 DIAGNOSIS — D649 Anemia, unspecified: Secondary | ICD-10-CM | POA: Diagnosis not present

## 2022-03-22 DIAGNOSIS — Z87891 Personal history of nicotine dependence: Secondary | ICD-10-CM | POA: Diagnosis not present

## 2022-03-22 DIAGNOSIS — E119 Type 2 diabetes mellitus without complications: Secondary | ICD-10-CM | POA: Diagnosis not present

## 2022-03-22 DIAGNOSIS — I480 Paroxysmal atrial fibrillation: Secondary | ICD-10-CM | POA: Diagnosis not present

## 2022-03-22 DIAGNOSIS — Z7984 Long term (current) use of oral hypoglycemic drugs: Secondary | ICD-10-CM | POA: Diagnosis not present

## 2022-03-22 DIAGNOSIS — C439 Malignant melanoma of skin, unspecified: Secondary | ICD-10-CM | POA: Insufficient documentation

## 2022-03-22 HISTORY — PX: IR IMAGING GUIDED PORT INSERTION: IMG5740

## 2022-03-22 LAB — GLUCOSE, CAPILLARY: Glucose-Capillary: 170 mg/dL — ABNORMAL HIGH (ref 70–99)

## 2022-03-22 MED ORDER — LIDOCAINE-EPINEPHRINE 1 %-1:100000 IJ SOLN
INTRAMUSCULAR | Status: AC
  Filled 2022-03-22: qty 1

## 2022-03-22 MED ORDER — SODIUM CHLORIDE 0.9 % IV SOLN
INTRAVENOUS | Status: DC
Start: 2022-03-22 — End: 2022-03-23

## 2022-03-22 MED ORDER — MIDAZOLAM HCL 2 MG/2ML IJ SOLN
INTRAMUSCULAR | Status: AC | PRN
  Administered 2022-03-22 (×2): 1 mg via INTRAVENOUS

## 2022-03-22 MED ORDER — FENTANYL CITRATE (PF) 100 MCG/2ML IJ SOLN
INTRAMUSCULAR | Status: AC | PRN
  Administered 2022-03-22 (×2): 50 ug via INTRAVENOUS

## 2022-03-22 MED ORDER — MIDAZOLAM HCL 2 MG/2ML IJ SOLN
INTRAMUSCULAR | Status: AC
Start: 2022-03-22 — End: 2022-03-22
  Filled 2022-03-22: qty 2

## 2022-03-22 MED ORDER — HEPARIN SOD (PORK) LOCK FLUSH 100 UNIT/ML IV SOLN
INTRAVENOUS | Status: AC
  Filled 2022-03-22: qty 5

## 2022-03-22 MED ORDER — FENTANYL CITRATE (PF) 100 MCG/2ML IJ SOLN
INTRAMUSCULAR | Status: AC
  Filled 2022-03-22: qty 2

## 2022-03-22 NOTE — Discharge Instructions (Signed)
Please call Interventional Radiology clinic [phone] with any questions or concerns. ? ?You may remove your dressing and shower tomorrow. ? ?DO NOT use EMLA cream on your port site for 2 weeks as this cream will remove surgical glue on your incision. ? ?Implanted Port Insertion, Care After ?This sheet gives you information about how to care for yourself after your procedure. Your health care provider may also give you more specific instructions. If you have problems or questions, contact your health care provider. ?What can I expect after the procedure? ?After the procedure, it is common to have: ?Discomfort at the port insertion site. ?Bruising on the skin over the port. This should improve over 3-4 days. ?Follow these instructions at home: ?Port care ?After your port is placed, you will get a manufacturer's information card. The card has information about your port. Keep this card with you at all times. ?Take care of the port as told by your health care provider. Ask your health care provider if you or a family member can get training for taking care of the port at home. A home health care nurse may also take care of the port. ?Make sure to remember what type of port you have. ?Incision care ?Follow instructions from your health care provider about how to take care of your port insertion site. Make sure you: ?Wash your hands with soap and water before and after you change your bandage (dressing). If soap and water are not available, use hand sanitizer. ?Change your dressing as told by your health care provider. ?Leave stitches (sutures), skin glue, or adhesive strips in place. These skin closures may need to stay in place for 2 weeks or longer. If adhesive strip edges start to loosen and curl up, you may trim the loose edges. Do not remove adhesive strips completely unless your health care provider tells you to do that. ?Check your port insertion site every day for signs of infection. Check for: ?Redness,  swelling, or pain. ?Fluid or blood. ?Warmth. ?Pus or a bad smell.  ? ?  ? ?  ?Activity ?Return to your normal activities as told by your health care provider. Ask your health care provider what activities are safe for you. ?Do not lift anything that is heavier than 10 lb (4.5 kg), or the limit that you are told, until your health care provider says that it is safe. ?General instructions ?Take over-the-counter and prescription medicines only as told by your health care provider. ?Do not take baths, swim, or use a hot tub until your health care provider approves. Ask your health care provider if you may take showers. You may only be allowed to take sponge baths. ?Do not drive for 24 hours if you were given a sedative during your procedure. ?Wear a medical alert bracelet in case of an emergency. This will tell any health care providers that you have a port. ?Keep all follow-up visits as told by your health care provider. This is important. ?Contact a health care provider if: ?You cannot flush your port with saline as directed, or you cannot draw blood from the port. ?You have a fever or chills. ?You have redness, swelling, or pain around your port insertion site. ?You have fluid or blood coming from your port insertion site. ?Your port insertion site feels warm to the touch. ?You have pus or a bad smell coming from the port insertion site. ?Get help right away if: ?You have chest pain or shortness of breath. ?You have bleeding from  your port that you cannot control. ?Summary ?Take care of the port as told by your health care provider. Keep the manufacturer's information card with you at all times. ?Change your dressing as told by your health care provider. ?Contact a health care provider if you have a fever or chills or if you have redness, swelling, or pain around your port insertion site. ?Keep all follow-up visits as told by your health care provider. ?This information is not intended to replace advice given to you  by your health care provider. Make sure you discuss any questions you have with your health care provider. ?Document Revised: 06/27/2018 Document Reviewed: 06/27/2018 ?Elsevier Patient Education ? [place]. ? ? ?Moderate Conscious Sedation, Adult, Care After ?This sheet gives you information about how to care for yourself after your procedure. Your health care provider may also give you more specific instructions. If you have problems or questions, contact your health care provider. ?What can I expect after the procedure? ?After the procedure, it is common to have: ?Sleepiness for several hours. ?Impaired judgment for several hours. ?Difficulty with balance. ?Vomiting if you eat too soon. ?Follow these instructions at home: ?For the time period you were told by your health care provider: ?Rest. ?Do not participate in activities where you could fall or become injured. ?Do not drive or use machinery. ?Do not drink alcohol. ?Do not take sleeping pills or medicines that cause drowsiness. ?Do not make important decisions or sign legal documents. ?Do not take care of children on your own.  ? ?  ? ?  ?Eating and drinking ?Follow the diet recommended by your health care provider. ?Drink enough fluid to keep your urine pale yellow. ?If you vomit: ?Drink water, juice, or soup when you can drink without vomiting. ?Make sure you have little or no nausea before eating solid foods.  ?  ?General instructions ?Take over-the-counter and prescription medicines only as told by your health care provider. ?Have a responsible adult stay with you for the time you are told. It is important to have someone help care for you until you are awake and alert. ?Do not smoke. ?Keep all follow-up visits as told by your health care provider. This is important. ?Contact a health care provider if: ?You are still sleepy or having trouble with balance after 24 hours. ?You feel light-headed. ?You keep feeling nauseous or you keep vomiting. ?You  develop a rash. ?You have a fever. ?You have redness or swelling around the IV site. ?Get help right away if: ?You have trouble breathing. ?You have new-onset confusion at home. ?Summary ?After the procedure, it is common to feel sleepy, have impaired judgment, or feel nauseous if you eat too soon. ?Rest after you get home. Know the things you should not do after the procedure. ?Follow the diet recommended by your health care provider and drink enough fluid to keep your urine pale yellow. ?Get help right away if you have trouble breathing or new-onset confusion at home. ?This information is not intended to replace advice given to you by your health care provider. Make sure you discuss any questions you have with your health care provider. ?Document Revised: 03/28/2020 Document Reviewed: 10/25/2019 ?Elsevier Patient Education ? [place].  ?
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
## 2022-03-22 NOTE — Procedures (Signed)
Interventional Radiology Procedure Note ? ?Procedure: Port placement. ? ?Indication: Metastatic Melanoma ? ?Findings: Please refer to procedural dictation for full description. ? ?Complications: None ? ?EBL: < 10 mL ? ?Miachel Roux, MD ?[phone] ? ? ?

## 2022-04-07 ENCOUNTER — Other Ambulatory Visit: Payer: Self-pay

## 2022-04-07 ENCOUNTER — Encounter: Payer: Self-pay | Admitting: Internal Medicine

## 2022-04-07 ENCOUNTER — Inpatient Hospital Stay (HOSPITAL_BASED_OUTPATIENT_CLINIC_OR_DEPARTMENT_OTHER): Payer: Medicare Other | Admitting: Internal Medicine

## 2022-04-07 ENCOUNTER — Ambulatory Visit: Payer: Medicare Other

## 2022-04-07 ENCOUNTER — Inpatient Hospital Stay: Payer: Medicare Other

## 2022-04-07 ENCOUNTER — Other Ambulatory Visit: Payer: Medicare Other

## 2022-04-07 ENCOUNTER — Other Ambulatory Visit: Payer: Self-pay | Admitting: Medical Oncology

## 2022-04-07 VITALS — BP 134/60 | HR 72 | Temp 97.6°F | Resp 15 | Wt 153.0 lb

## 2022-04-07 VITALS — BP 124/62 | HR 69 | Temp 97.8°F | Resp 18

## 2022-04-07 DIAGNOSIS — Z95828 Presence of other vascular implants and grafts: Secondary | ICD-10-CM

## 2022-04-07 DIAGNOSIS — Z5112 Encounter for antineoplastic immunotherapy: Secondary | ICD-10-CM | POA: Diagnosis not present

## 2022-04-07 DIAGNOSIS — C439 Malignant melanoma of skin, unspecified: Secondary | ICD-10-CM

## 2022-04-07 DIAGNOSIS — C7801 Secondary malignant neoplasm of right lung: Secondary | ICD-10-CM

## 2022-04-07 DIAGNOSIS — C7800 Secondary malignant neoplasm of unspecified lung: Secondary | ICD-10-CM

## 2022-04-07 LAB — CMP (CANCER CENTER ONLY)
ALT: 21 U/L (ref 0–44)
AST: 19 U/L (ref 15–41)
Albumin: 3.9 g/dL (ref 3.5–5.0)
Alkaline Phosphatase: 60 U/L (ref 38–126)
Anion gap: 6 (ref 5–15)
BUN: 24 mg/dL — ABNORMAL HIGH (ref 8–23)
CO2: 25 mmol/L (ref 22–32)
Calcium: 10 mg/dL (ref 8.9–10.3)
Chloride: 103 mmol/L (ref 98–111)
Creatinine: 1.05 mg/dL (ref 0.61–1.24)
GFR, Estimated: >60 mL/min (ref >60)
Glucose, Bld: 161 mg/dL — ABNORMAL HIGH (ref 70–99)
Potassium: 4.3 mmol/L (ref 3.5–5.1)
Sodium: 134 mmol/L — ABNORMAL LOW (ref 135–145)
Total Bilirubin: 0.3 mg/dL (ref 0.3–1.2)
Total Protein: 7.2 g/dL (ref 6.5–8.1)

## 2022-04-07 LAB — CBC WITH DIFFERENTIAL (CANCER CENTER ONLY)
Abs Immature Granulocytes: 0.02 10*3/uL (ref 0.00–0.07)
Basophils Absolute: 0 10*3/uL (ref 0.0–0.1)
Basophils Relative: 0 %
Eosinophils Absolute: 0.4 10*3/uL (ref 0.0–0.5)
Eosinophils Relative: 6 %
HCT: 30.7 % — ABNORMAL LOW (ref 39.0–52.0)
Hemoglobin: 10 g/dL — ABNORMAL LOW (ref 13.0–17.0)
Immature Granulocytes: 0 %
Lymphocytes Relative: 28 %
Lymphs Abs: 1.6 10*3/uL (ref 0.7–4.0)
MCH: 31.3 pg (ref 26.0–34.0)
MCHC: 32.6 g/dL (ref 30.0–36.0)
MCV: 96.2 fL (ref 80.0–100.0)
Monocytes Absolute: 0.7 10*3/uL (ref 0.1–1.0)
Monocytes Relative: 13 %
Neutro Abs: 3 10*3/uL (ref 1.7–7.7)
Neutrophils Relative %: 53 %
Platelet Count: 190 10*3/uL (ref 150–400)
RBC: 3.19 MIL/uL — ABNORMAL LOW (ref 4.22–5.81)
RDW: 15 % (ref 11.5–15.5)
WBC Count: 5.7 10*3/uL (ref 4.0–10.5)
nRBC: 0 % (ref 0.0–0.2)

## 2022-04-07 LAB — TSH: TSH: 3.425 u[IU]/mL (ref 0.350–4.500)

## 2022-04-07 MED ORDER — SODIUM CHLORIDE 0.9 % IV SOLN: Freq: Once | INTRAVENOUS | Status: AC

## 2022-04-07 MED ORDER — SODIUM CHLORIDE 0.9 % IV SOLN
480.0000 mg | Freq: Once | INTRAVENOUS | Status: AC
  Administered 2022-04-07: 480 mg via INTRAVENOUS
  Filled 2022-04-07: qty 48

## 2022-04-07 MED ORDER — LIDOCAINE-PRILOCAINE 2.5-2.5 % EX CREA: 1.0000 "application " | TOPICAL_CREAM | CUTANEOUS | 0 refills | Status: AC | PRN

## 2022-04-07 MED ORDER — SODIUM CHLORIDE 0.9% FLUSH
10.0000 mL | INTRAVENOUS | Status: DC | PRN
  Administered 2022-04-07: 10 mL

## 2022-04-07 MED ORDER — HEPARIN SOD (PORK) LOCK FLUSH 100 UNIT/ML IV SOLN
500.0000 [IU] | Freq: Once | INTRAVENOUS | Status: AC | PRN
  Administered 2022-04-07: 500 [IU]

## 2022-04-07 NOTE — Progress Notes (Signed)
?    [facility] ?[phone]   [phone] ? ?OFFICE PROGRESS NOTE ? ?[name] [name], [name] Him, MD ?[address] ?[place] [address] ? ?DIAGNOSIS: Stage IV (T3, N3, M1) metastatic melanoma.  He presented with large right middle lobe lung mass in addition to large right hilar/suprahilar mass and adenopathy as well as postobstructive consolidation and left hilar adenopathy as well as small right pleural effusion. ?  ?Molecular Studies:  ?PD-L1 expression 100%. PTEN deletion negative, TERT negative, BRAF wild type. ?  ?PRIOR THERAPY:  palliative radiation to the large obstructive mass under the care of Dr. Tammi Klippel. Last dose on 12/16/21.  ?  ?CURRENT THERAPY: immunotherapy with nivolumab 1 Mg/KG and ipilimumab 3 Mg/KG IV every 3 weeks.  First dose expected on 12/17/2021.  Status post 5 cycles.  Starting from cycle #5 the patient is on maintenance treatment with single agent nivolumab every 4 weeks. ? ?INTERVAL HISTORY: ?Tylek Boney 76 y.o. male returns to the clinic today for follow-up visit accompanied by his wife.  The patient is feeling fine today with no concerning complaints.  He denied having any chest pain, shortness of breath, cough or hemoptysis.  He denied having any fever or chills.  He has no nausea, vomiting, diarrhea or constipation.  He has no headache or visual changes.  He has no recent weight loss or night sweats.  He is here today for evaluation before starting cycle #6.  He is leaving to [place] for 2 weeks vacation next week. ? ?MEDICAL HISTORY: ?Past Medical History:  ?Diagnosis Date  ? Anemia   ? CHF (congestive heart failure) ([place])   ? Complication of anesthesia   ? slow to awaken  x1  ? Dyspnea   ? Family hx of prostate cancer   ? IN BROTHER  ? History of echocardiogram   ? Echo 5/18: EF 98-33, normal diastolic function, PASP 32  ? History of nuclear stress test   ? Myoview 5/18: EF 60, inf defect suspicious for artifact, no ischemia, Low Risk  ? Hx of basal cell  carcinoma   ? FOLLOWED BY DERMATOLOGIST DR. [place]  ? Hyperlipidemia   ? PAF (paroxysmal atrial fibrillation) ([place])   ? DIAGNOSED 5/18  ? Pneumonia   ? Type 2 diabetes mellitus ([place])   ? ? ?ALLERGIES:  has No Known Allergies. ? ?MEDICATIONS:  ?Current Outpatient Medications  ?Medication Sig Dispense Refill  ? ACCU-CHEK SOFTCLIX LANCETS lancets by Other route. Use as instructed    ? acetaminophen (TYLENOL) 500 MG tablet Take 1,000 mg by mouth every 6 (six) hours as needed for moderate pain.    ? apixaban (ELIQUIS) 5 MG TABS tablet Take 1 tablet (5 mg total) by mouth 2 (two) times daily. 60 tablet 3  ? Blood Glucose Monitoring Suppl (ACCU-CHEK AVIVA PLUS) w/Device KIT by Does not apply route.    ? Carboxymethylcellul-Glycerin (LUBRICATING EYE DROPS OP) Place 1 drop into both eyes daily as needed (dry eyes).    ? Cholecalciferol (VITAMIN D3) 50 MCG (2000 UT) capsule Take 2,000 Units by mouth daily.    ? Cyanocobalamin (B-12) 2500 MCG TABS Take 2,500 mcg by mouth once a week.    ? dextromethorphan (DELSYM) 30 MG/5ML liquid Take 30 mg by mouth 2 (two) times daily as needed for cough.    ? dextromethorphan-guaiFENesin (MUCINEX DM) 30-600 MG 12hr tablet Take 1 tablet by mouth 2 (two) times daily as needed (congestion).    ? DM-APAP-CPM (CORICIDIN HBP PO) Take 1  tablet by mouth daily as needed (congestion).    ? docusate sodium (COLACE) 100 MG capsule Take 100 mg by mouth daily as needed for mild constipation.    ? ferrous sulfate 325 (65 FE) MG tablet Take 325 mg by mouth daily with breakfast.    ? glipiZIDE (GLUCOTROL) 5 MG tablet Take 5 mg by mouth daily. Not started medication yet-    ? glucose blood test strip 1 each by Other route as needed for other. Use as instructed    ? hydrocortisone 2.5 % cream Apply topically 2 (two) times daily. 453.6 g 0  ? metFORMIN (GLUCOPHAGE) 500 MG tablet Take 1,000 mg by mouth 2 (two) times daily.    ? metoprolol succinate (TOPROL-XL) 50 MG 24 hr tablet Take 1 tablet (50 mg total) by  mouth daily. Take with or immediately following a meal. 30 tablet 3  ? simvastatin (ZOCOR) 40 MG tablet Take 40 mg by mouth daily at 6 PM.    ? triamcinolone cream (KENALOG) 0.1 % Apply 1 application. topically.    ? zinc gluconate 50 MG tablet Take 50 mg by mouth daily.    ? ?No current facility-administered medications for this visit.  ? ?Facility-Administered Medications Ordered in Other Visits  ?Medication Dose Route Frequency Provider Last Rate Last Admin  ? regadenoson (LEXISCAN) injection SOLN 0.4 mg  0.4 mg Intravenous Once Hilty, Nadean Corwin, MD      ? ? ?SURGICAL HISTORY:  ?Past Surgical History:  ?Procedure Laterality Date  ? [place]  ? DR. DAVIS  ? BASAL CELL CARCINOMA EXCISION    ? OUTPATIENT  ? BRONCHIAL BIOPSY  11/09/2021  ? Procedure: BRONCHIAL BIOPSIES;  Surgeon: Collene Gobble, MD;  Location: [facility] ENDOSCOPY;  Service: Pulmonary;;  ? BRONCHIAL BIOPSY  11/23/2021  ? Procedure: BRONCHIAL BIOPSIES;  Surgeon: Collene Gobble, MD;  Location: [facility] ENDOSCOPY;  Service: Pulmonary;;  ? BRONCHIAL BRUSHINGS  11/09/2021  ? Procedure: BRONCHIAL BRUSHINGS;  Surgeon: Collene Gobble, MD;  Location: [facility] ENDOSCOPY;  Service: Pulmonary;;  ? BRONCHIAL BRUSHINGS  11/23/2021  ? Procedure: BRONCHIAL BRUSHINGS;  Surgeon: Collene Gobble, MD;  Location: [facility] ENDOSCOPY;  Service: Pulmonary;;  ? BRONCHIAL NEEDLE ASPIRATION BIOPSY  11/09/2021  ? Procedure: BRONCHIAL NEEDLE ASPIRATION BIOPSIES;  Surgeon: Collene Gobble, MD;  Location: [facility] ENDOSCOPY;  Service: Pulmonary;;  ? BRONCHIAL NEEDLE ASPIRATION BIOPSY  11/23/2021  ? Procedure: BRONCHIAL NEEDLE ASPIRATION BIOPSIES;  Surgeon: Collene Gobble, MD;  Location: [facility] ENDOSCOPY;  Service: Pulmonary;;  ? COLONOSCOPY  02/20/2003, 04/24/14  ? IR IMAGING GUIDED PORT INSERTION  03/22/2022  ? SKIN GRAFT    ? ON THE RIGHT HAND AFTER A BURN IN THE DISTANT PAST  ? VASECTOMY    ? OUTPATIENT  ? VIDEO BRONCHOSCOPY WITH ENDOBRONCHIAL ULTRASOUND Bilateral 11/09/2021  ? Procedure: VIDEO  BRONCHOSCOPY WITH ENDOBRONCHIAL ULTRASOUND;  Surgeon: Collene Gobble, MD;  Location[facility]COPY;  Service: Pulmonary;  Laterality: Bilateral;  ? VIDEO BRONCHOSCOPY WITH ENDOBRONCHIAL ULTRASOUND N/A 11/23/2021  ? Procedure: VIDEO BRONCHOSCOPY WITH ENDOBRONCHIAL ULTRASOUND;  Surgeon: Collene Gobble, MD;  Location: [facility] ENDOSCOPY;  Service: Pulmonary;  Laterality: N/A;  ? VIDEO BRONCHOSCOPY WITH RADIAL ENDOBRONCHIAL ULTRASOUND  11/09/2021  ? Procedure: VIDEO BRONCHOSCOPY WITH RADIAL ENDOBRONCHIAL ULTRASOUND;  Surgeon: Collene Gobble, MD;  Location: [facility] ENDOSCOPY;  Service: Pulmonary;;  ? ? ?REVIEW OF SYSTEMS:  A comprehensive review of systems was negative.  ? ?PHYSICAL EXAMINATION: General appearance: alert, cooperative, and no distress ?Head: Normocephalic,  without obvious abnormality, atraumatic ?Neck: no adenopathy, no JVD, supple, symmetrical, trachea midline, and thyroid not enlarged, symmetric, no tenderness/mass/nodules ?Lymph nodes: Cervical, supraclavicular, and axillary nodes normal. ?Resp: clear to auscultation bilaterally ?Back: symmetric, no curvature. ROM normal. No CVA tenderness. ?Cardio: regular rate and rhythm, S1, S2 normal, no murmur, click, rub or gallop ?GI: soft, non-tender; bowel sounds normal; no masses,  no organomegaly ?Extremities: extremities normal, atraumatic, no cyanosis or edema and edema No  ? ?ECOG PERFORMANCE STATUS: 1 - Symptomatic but completely ambulatory ? ?Blood pressure 134/60, pulse 72, temperature 97.6 ?F (36.4 ?C), temperature source Tympanic, resp. rate 15, weight 153 lb (69.4 kg), SpO2 100 %. ? ?LABORATORY DATA: ?Lab Results  ?Component Value Date  ? WBC 5.7 04/07/2022  ? HGB 10.0 (L) 04/07/2022  ? HCT 30.7 (L) 04/07/2022  ? MCV 96.2 04/07/2022  ? PLT 190 04/07/2022  ? ? ?  Chemistry   ?   ?Component Value Date/Time  ? NA 133 (L) 03/11/2022 1021  ? NA 135 02/23/2022 1429  ? K 4.4 03/11/2022 1021  ? CL 102 03/11/2022 1021  ? CO2 23 03/11/2022 1021  ? BUN 22 03/11/2022  1021  ? BUN 23 02/23/2022 1429  ? CREATININE 0.85 03/11/2022 1021  ?    ?Component Value Date/Time  ? CALCIUM 9.6 03/11/2022 1021  ? ALKPHOS 55 03/11/2022 1021  ? AST 15 03/11/2022 1021  ? ALT 16 03/3

## 2022-04-07 NOTE — Patient Instructions (Signed)
[facility]  Discharge Instructions: ?Thank you for choosing [facility] to provide your oncology and hematology care.  ? ?If you have a lab appointment with the [facility], please go directly to the [facility] and check in at the registration area. ?  ?Wear comfortable clothing and clothing appropriate for easy access to any Portacath or PICC line.  ? ?We strive to give you quality time with your provider. You may need to reschedule your appointment if you arrive late (15 or more minutes).  Arriving late affects you and other patients whose appointments are after yours.  Also, if you miss three or more appointments without notifying the office, you may be dismissed from the clinic at the provider?s discretion.    ?  ?For prescription refill requests, have your pharmacy contact our office and allow 72 hours for refills to be completed.   ? ?Today you received the following chemotherapy and/or immunotherapy agents opdivo    ?  ?To help prevent nausea and vomiting after your treatment, we encourage you to take your nausea medication as directed. ? ?BELOW ARE SYMPTOMS THAT SHOULD BE REPORTED IMMEDIATELY: ?*FEVER GREATER THAN 100.4 F (38 ?C) OR HIGHER ?*CHILLS OR SWEATING ?*NAUSEA AND VOMITING THAT IS NOT CONTROLLED WITH YOUR NAUSEA MEDICATION ?*UNUSUAL SHORTNESS OF BREATH ?*UNUSUAL BRUISING OR BLEEDING ?*URINARY PROBLEMS (pain or burning when urinating, or frequent urination) ?*BOWEL PROBLEMS (unusual diarrhea, constipation, pain near the anus) ?TENDERNESS IN MOUTH AND THROAT WITH OR WITHOUT PRESENCE OF ULCERS (sore throat, sores in mouth, or a toothache) ?UNUSUAL RASH, SWELLING OR PAIN  ?UNUSUAL VAGINAL DISCHARGE OR ITCHING  ? ?Items with * indicate a potential emergency and should be followed up as soon as possible or go to the Emergency Department if any problems should occur. ? ?Please show the CHEMOTHERAPY ALERT CARD or IMMUNOTHERAPY ALERT CARD at check-in to the  Emergency Department and triage nurse. ? ?Should you have questions after your visit or need to cancel or reschedule your appointment, please contact [facility]  Dept: [phone]  and follow the prompts.  Office hours are 8:00 a.m. to 4:30 p.m. Monday - Friday. Please note that voicemails left after 4:00 p.m. may not be returned until the following business day.  We are closed weekends and major holidays. You have access to a nurse at all times for urgent questions. Please call the main number to the clinic Dept: [phone] and follow the prompts. ? ? ?For any non-urgent questions, you may also contact your provider using MyChart. We now offer e-Visits for anyone 38 and older to request care online for non-urgent symptoms. For details visit [email or web address]. ?  ?Also download the MyChart app! Go to the app store, search "MyChart", open the app, select , and log in with your MyChart username and password. ? ?Due to Covid, a mask is required upon entering the hospital/clinic. If you do not have a mask, one will be given to you upon arrival. For doctor visits, patients may have 1 support person aged 67 or older with them. For treatment visits, patients cannot have anyone with them due to current Covid guidelines and our immunocompromised population.  ? ?

## 2022-04-12 ENCOUNTER — Telehealth: Payer: Self-pay | Admitting: Medical Oncology

## 2022-04-12 NOTE — Telephone Encounter (Signed)
COVID vaccine- can [name] [name] get the " new COVID vaccine" . They are leaving for [place] on Sunday . Per Dr [name] , I told them it was fine ,but that he may not feel well for a few days.He may want  to get it today . ? ?

## 2022-04-13 ENCOUNTER — Ambulatory Visit: Payer: Medicare Other | Admitting: Internal Medicine

## 2022-05-01 ENCOUNTER — Ambulatory Visit (HOSPITAL_COMMUNITY)
Admission: RE | Admit: 2022-05-01 | Discharge: 2022-05-01 | Disposition: A | Discharge status: Home / Self Care | Payer: Medicare Other | Source: Ambulatory Visit | Attending: Internal Medicine | Admitting: Internal Medicine

## 2022-05-01 DIAGNOSIS — C439 Malignant melanoma of skin, unspecified: Secondary | ICD-10-CM | POA: Diagnosis present

## 2022-05-01 DIAGNOSIS — C7800 Secondary malignant neoplasm of unspecified lung: Secondary | ICD-10-CM | POA: Insufficient documentation

## 2022-05-01 MED ORDER — IOHEXOL 300 MG/ML  SOLN
100.0000 mL | Freq: Once | INTRAMUSCULAR | Status: AC | PRN
  Administered 2022-05-01: 100 mL via INTRAVENOUS

## 2022-05-03 ENCOUNTER — Other Ambulatory Visit: Payer: Self-pay

## 2022-05-03 DIAGNOSIS — C439 Malignant melanoma of skin, unspecified: Secondary | ICD-10-CM
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
## 2022-05-05 ENCOUNTER — Other Ambulatory Visit: Payer: Self-pay

## 2022-05-05 ENCOUNTER — Inpatient Hospital Stay: Payer: Medicare Other

## 2022-05-05 ENCOUNTER — Inpatient Hospital Stay: Payer: Medicare Other | Admitting: Dietician

## 2022-05-05 ENCOUNTER — Inpatient Hospital Stay: Payer: Medicare Other | Attending: Internal Medicine | Admitting: Physician Assistant

## 2022-05-05 ENCOUNTER — Other Ambulatory Visit: Payer: Medicare Other

## 2022-05-05 ENCOUNTER — Encounter: Payer: Self-pay | Admitting: Physician Assistant

## 2022-05-05 VITALS — BP 137/68 | HR 72 | Temp 98.0°F | Resp 15 | Wt 152.9 lb

## 2022-05-05 DIAGNOSIS — C7801 Secondary malignant neoplasm of right lung: Secondary | ICD-10-CM | POA: Diagnosis not present

## 2022-05-05 DIAGNOSIS — C7800 Secondary malignant neoplasm of unspecified lung: Secondary | ICD-10-CM | POA: Diagnosis not present

## 2022-05-05 DIAGNOSIS — Z79899 Other long term (current) drug therapy: Secondary | ICD-10-CM | POA: Insufficient documentation

## 2022-05-05 DIAGNOSIS — Z95828 Presence of other vascular implants and grafts: Secondary | ICD-10-CM | POA: Insufficient documentation

## 2022-05-05 DIAGNOSIS — C439 Malignant melanoma of skin, unspecified: Secondary | ICD-10-CM

## 2022-05-05 DIAGNOSIS — J90 Pleural effusion, not elsewhere classified: Secondary | ICD-10-CM | POA: Insufficient documentation

## 2022-05-05 DIAGNOSIS — Z5112 Encounter for antineoplastic immunotherapy: Secondary | ICD-10-CM | POA: Diagnosis present

## 2022-05-05 LAB — CBC WITH DIFFERENTIAL (CANCER CENTER ONLY)
Abs Immature Granulocytes: 0.02 10*3/uL (ref 0.00–0.07)
Basophils Absolute: 0 10*3/uL (ref 0.0–0.1)
Basophils Relative: 0 %
Eosinophils Absolute: 0.3 10*3/uL (ref 0.0–0.5)
Eosinophils Relative: 5 %
HCT: 29.4 % — ABNORMAL LOW (ref 39.0–52.0)
Hemoglobin: 9.7 g/dL — ABNORMAL LOW (ref 13.0–17.0)
Immature Granulocytes: 0 %
Lymphocytes Relative: 26 %
Lymphs Abs: 1.3 10*3/uL (ref 0.7–4.0)
MCH: 31.7 pg (ref 26.0–34.0)
MCHC: 33 g/dL (ref 30.0–36.0)
MCV: 96.1 fL (ref 80.0–100.0)
Monocytes Absolute: 0.6 10*3/uL (ref 0.1–1.0)
Monocytes Relative: 13 %
Neutro Abs: 2.6 10*3/uL (ref 1.7–7.7)
Neutrophils Relative %: 56 %
Platelet Count: 206 10*3/uL (ref 150–400)
RBC: 3.06 MIL/uL — ABNORMAL LOW (ref 4.22–5.81)
RDW: 13.8 % (ref 11.5–15.5)
WBC Count: 4.8 10*3/uL (ref 4.0–10.5)
nRBC: 0 % (ref 0.0–0.2)

## 2022-05-05 LAB — CMP (CANCER CENTER ONLY)
ALT: 16 U/L (ref 0–44)
AST: 17 U/L (ref 15–41)
Albumin: 3.9 g/dL (ref 3.5–5.0)
Alkaline Phosphatase: 50 U/L (ref 38–126)
Anion gap: 6 (ref 5–15)
BUN: 24 mg/dL — ABNORMAL HIGH (ref 8–23)
CO2: 26 mmol/L (ref 22–32)
Calcium: 9.7 mg/dL (ref 8.9–10.3)
Chloride: 102 mmol/L (ref 98–111)
Creatinine: 1.09 mg/dL (ref 0.61–1.24)
GFR, Estimated: >60 mL/min (ref >60)
Glucose, Bld: 222 mg/dL — ABNORMAL HIGH (ref 70–99)
Potassium: 4.4 mmol/L (ref 3.5–5.1)
Sodium: 134 mmol/L — ABNORMAL LOW (ref 135–145)
Total Bilirubin: 0.4 mg/dL (ref 0.3–1.2)
Total Protein: 7.3 g/dL (ref 6.5–8.1)

## 2022-05-05 LAB — TSH: TSH: 5.401 u[IU]/mL — ABNORMAL HIGH (ref 0.350–4.500)

## 2022-05-05 MED ORDER — SODIUM CHLORIDE 0.9 % IV SOLN
480.0000 mg | Freq: Once | INTRAVENOUS | Status: AC
  Administered 2022-05-05: 480 mg via INTRAVENOUS
  Filled 2022-05-05: qty 48

## 2022-05-05 MED ORDER — SODIUM CHLORIDE 0.9% FLUSH
10.0000 mL | INTRAVENOUS | Status: DC | PRN
  Administered 2022-05-05: 10 mL

## 2022-05-05 MED ORDER — HEPARIN SOD (PORK) LOCK FLUSH 100 UNIT/ML IV SOLN
500.0000 [IU] | Freq: Once | INTRAVENOUS | Status: AC | PRN
  Administered 2022-05-05: 500 [IU]

## 2022-05-05 MED ORDER — SODIUM CHLORIDE 0.9% FLUSH
10.0000 mL | Freq: Once | INTRAVENOUS | Status: AC
  Administered 2022-05-05: 10 mL

## 2022-05-05 MED ORDER — SODIUM CHLORIDE 0.9 % IV SOLN: Freq: Once | INTRAVENOUS | Status: AC

## 2022-05-05 NOTE — Progress Notes (Signed)
Nutrition Follow-up:  Patient receiving immunotherapy nivolumab + ipilimumab (1/3) q21d.   Met with patient during infusion. He reports good appetite and is eating well. Patient recalls 3 meals day and drinking one Ensure. Patient denies nutrition impact symptoms.    Medications: reviewed   Labs: Na 133, Glucose 222, BUN 24  Anthropometrics: Weight 152 lb 14.4 oz today increased  4/10 - 142 lb  3/30 - 148 lb 3.2 oz  2/28 - 142 lb 12.8 oz   NUTRITION DIAGNOSIS: Severe malnutrition stable    INTERVENTION:  Encouraged high calorie high protein foods for weight maintenance Continue drinking Ensure Plus/equivalent daily - coupons provided     MONITORING, EVALUATION, GOAL: weight trends, intake    NEXT VISIT: To be scheduled as needed

## 2022-05-05 NOTE — Patient Instructions (Signed)
[facility] ONCOLOGY  Discharge Instructions: Thank you for choosing [facility] to provide your oncology and hematology care.   If you have a lab appointment with the [facility], please go directly to the [facility] and check in at the registration area.   Wear comfortable clothing and clothing appropriate for easy access to any Portacath or PICC line.   We strive to give you quality time with your provider. You may need to reschedule your appointment if you arrive late (15 or more minutes).  Arriving late affects you and other patients whose appointments are after yours.  Also, if you miss three or more appointments without notifying the office, you may be dismissed from the clinic at the provider's discretion.      For prescription refill requests, have your pharmacy contact our office and allow 72 hours for refills to be completed.    Today you received the following chemotherapy and/or immunotherapy agent: opdivo      To help prevent nausea and vomiting after your treatment, we encourage you to take your nausea medication as directed.  BELOW ARE SYMPTOMS THAT SHOULD BE REPORTED IMMEDIATELY: *FEVER GREATER THAN 100.4 F (38 C) OR HIGHER *CHILLS OR SWEATING *NAUSEA AND VOMITING THAT IS NOT CONTROLLED WITH YOUR NAUSEA MEDICATION *UNUSUAL SHORTNESS OF BREATH *UNUSUAL BRUISING OR BLEEDING *URINARY PROBLEMS (pain or burning when urinating, or frequent urination) *BOWEL PROBLEMS (unusual diarrhea, constipation, pain near the anus) TENDERNESS IN MOUTH AND THROAT WITH OR WITHOUT PRESENCE OF ULCERS (sore throat, sores in mouth, or a toothache) UNUSUAL RASH, SWELLING OR PAIN  UNUSUAL VAGINAL DISCHARGE OR ITCHING   Items with * indicate a potential emergency and should be followed up as soon as possible or go to the Emergency Department if any problems should occur.  Please show the CHEMOTHERAPY ALERT CARD or IMMUNOTHERAPY ALERT CARD at check-in to the  Emergency Department and triage nurse.  Should you have questions after your visit or need to cancel or reschedule your appointment, please contact [facility]  Dept: [phone]  and follow the prompts.  Office hours are 8:00 a.m. to 4:30 p.m. Monday - Friday. Please note that voicemails left after 4:00 p.m. may not be returned until the following business day.  We are closed weekends and major holidays. You have access to a nurse at all times for urgent questions. Please call the main number to the clinic Dept: [phone] and follow the prompts.   For any non-urgent questions, you may also contact your provider using MyChart. We now offer e-Visits for anyone 45 and older to request care online for non-urgent symptoms. For details visit [email or web address].   Also download the MyChart app! Go to the app store, search "MyChart", open the app, select [facility], and log in with your MyChart username and password.  Due to Covid, a mask is required upon entering the hospital/clinic. If you do not have a mask, one will be given to you upon arrival. For doctor visits, patients may have 1 support person aged 11 or older with them. For treatment visits, patients cannot have anyone with them due to current Covid guidelines and our immunocompromised population.
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
## 2022-05-31 ENCOUNTER — Telehealth: Payer: Self-pay

## 2022-05-31 NOTE — Telephone Encounter (Signed)
Pt has had eight episodes of blood tinged sputum. Pt has f/u with Dr. [name] on 06/02/22. Pt is comfortable waited until then to address this issue,

## 2022-06-02 ENCOUNTER — Inpatient Hospital Stay: Payer: Medicare Other | Attending: Internal Medicine | Admitting: Physician Assistant

## 2022-06-02 ENCOUNTER — Other Ambulatory Visit: Payer: Medicare Other

## 2022-06-02 ENCOUNTER — Inpatient Hospital Stay: Payer: Medicare Other

## 2022-06-02 VITALS — BP 121/59 | HR 86 | Temp 97.4°F | Resp 16 | Wt 160.5 lb

## 2022-06-02 VITALS — BP 124/62 | HR 79 | Resp 16

## 2022-06-02 DIAGNOSIS — E1165 Type 2 diabetes mellitus with hyperglycemia: Secondary | ICD-10-CM | POA: Diagnosis not present

## 2022-06-02 DIAGNOSIS — J90 Pleural effusion, not elsewhere classified: Secondary | ICD-10-CM | POA: Insufficient documentation

## 2022-06-02 DIAGNOSIS — C439 Malignant melanoma of skin, unspecified: Secondary | ICD-10-CM | POA: Insufficient documentation

## 2022-06-02 DIAGNOSIS — E119 Type 2 diabetes mellitus without complications: Secondary | ICD-10-CM | POA: Diagnosis not present

## 2022-06-02 DIAGNOSIS — Z95828 Presence of other vascular implants and grafts: Secondary | ICD-10-CM

## 2022-06-02 DIAGNOSIS — C7801 Secondary malignant neoplasm of right lung: Secondary | ICD-10-CM | POA: Insufficient documentation

## 2022-06-02 DIAGNOSIS — Z7901 Long term (current) use of anticoagulants: Secondary | ICD-10-CM | POA: Insufficient documentation

## 2022-06-02 DIAGNOSIS — Z79899 Other long term (current) drug therapy: Secondary | ICD-10-CM | POA: Insufficient documentation

## 2022-06-02 DIAGNOSIS — Z5112 Encounter for antineoplastic immunotherapy: Secondary | ICD-10-CM | POA: Diagnosis present

## 2022-06-02 DIAGNOSIS — C3491 Malignant neoplasm of unspecified part of right bronchus or lung: Secondary | ICD-10-CM

## 2022-06-02 LAB — CBC WITH DIFFERENTIAL (CANCER CENTER ONLY)
Abs Immature Granulocytes: 0.03 10*3/uL (ref 0.00–0.07)
Basophils Absolute: 0 10*3/uL (ref 0.0–0.1)
Basophils Relative: 0 %
Eosinophils Absolute: 0.3 10*3/uL (ref 0.0–0.5)
Eosinophils Relative: 6 %
HCT: 26.8 % — ABNORMAL LOW (ref 39.0–52.0)
Hemoglobin: 9.2 g/dL — ABNORMAL LOW (ref 13.0–17.0)
Immature Granulocytes: 1 %
Lymphocytes Relative: 23 %
Lymphs Abs: 0.9 10*3/uL (ref 0.7–4.0)
MCH: 33 pg (ref 26.0–34.0)
MCHC: 34.3 g/dL (ref 30.0–36.0)
MCV: 96.1 fL (ref 80.0–100.0)
Monocytes Absolute: 0.5 10*3/uL (ref 0.1–1.0)
Monocytes Relative: 11 %
Neutro Abs: 2.4 10*3/uL (ref 1.7–7.7)
Neutrophils Relative %: 59 %
Platelet Count: 180 10*3/uL (ref 150–400)
RBC: 2.79 MIL/uL — ABNORMAL LOW (ref 4.22–5.81)
RDW: 12.7 % (ref 11.5–15.5)
WBC Count: 4.1 10*3/uL (ref 4.0–10.5)
nRBC: 0 % (ref 0.0–0.2)

## 2022-06-02 LAB — CMP (CANCER CENTER ONLY)
ALT: 17 U/L (ref 0–44)
AST: 15 U/L (ref 15–41)
Albumin: 3.7 g/dL (ref 3.5–5.0)
Alkaline Phosphatase: 43 U/L (ref 38–126)
Anion gap: 6 (ref 5–15)
BUN: 23 mg/dL (ref 8–23)
CO2: 24 mmol/L (ref 22–32)
Calcium: 9.1 mg/dL (ref 8.9–10.3)
Chloride: 101 mmol/L (ref 98–111)
Creatinine: 1.06 mg/dL (ref 0.61–1.24)
GFR, Estimated: >60 mL/min (ref >60)
Glucose, Bld: 325 mg/dL — ABNORMAL HIGH (ref 70–99)
Potassium: 4.3 mmol/L (ref 3.5–5.1)
Sodium: 131 mmol/L — ABNORMAL LOW (ref 135–145)
Total Bilirubin: 0.4 mg/dL (ref 0.3–1.2)
Total Protein: 6.7 g/dL (ref 6.5–8.1)

## 2022-06-02 LAB — TSH: TSH: 3.912 u[IU]/mL (ref 0.350–4.500)

## 2022-06-02 LAB — GLUCOSE, CAPILLARY: Glucose-Capillary: 216 mg/dL — ABNORMAL HIGH (ref 70–99)

## 2022-06-02 MED ORDER — SODIUM CHLORIDE 0.9 % IV SOLN
480.0000 mg | Freq: Once | INTRAVENOUS | Status: AC
  Administered 2022-06-02: 480 mg via INTRAVENOUS
  Filled 2022-06-02: qty 48

## 2022-06-02 MED ORDER — SODIUM CHLORIDE 0.9% FLUSH
10.0000 mL | Freq: Once | INTRAVENOUS | Status: AC
  Administered 2022-06-02: 10 mL

## 2022-06-02 MED ORDER — HEPARIN SOD (PORK) LOCK FLUSH 100 UNIT/ML IV SOLN
500.0000 [IU] | Freq: Once | INTRAVENOUS | Status: AC | PRN
  Administered 2022-06-02: 500 [IU]

## 2022-06-02 MED ORDER — INSULIN ASPART 100 UNIT/ML IJ SOLN
5.0000 [IU] | Freq: Once | INTRAMUSCULAR | Status: AC
  Administered 2022-06-02: 5 [IU] via SUBCUTANEOUS
  Filled 2022-06-02: qty 1

## 2022-06-02 MED ORDER — SODIUM CHLORIDE 0.9 % IV SOLN: Freq: Once | INTRAVENOUS | Status: AC

## 2022-06-02 MED ORDER — SODIUM CHLORIDE 0.9% FLUSH
10.0000 mL | INTRAVENOUS | Status: DC | PRN
  Administered 2022-06-02: 10 mL

## 2022-06-02 NOTE — Patient Instructions (Signed)
[facility] ONCOLOGY   Discharge Instructions: Thank you for choosing [facility] to provide your oncology and hematology care.   If you have a lab appointment with the [facility], please go directly to the [facility] and check in at the registration area.   Wear comfortable clothing and clothing appropriate for easy access to any Portacath or PICC line.   We strive to give you quality time with your provider. You may need to reschedule your appointment if you arrive late (15 or more minutes).  Arriving late affects you and other patients whose appointments are after yours.  Also, if you miss three or more appointments without notifying the office, you may be dismissed from the clinic at the provider's discretion.      For prescription refill requests, have your pharmacy contact our office and allow 72 hours for refills to be completed.    Today you received the following chemotherapy and/or immunotherapy agents: Nivolumab (Opdivo)      To help prevent nausea and vomiting after your treatment, we encourage you to take your nausea medication as directed.  BELOW ARE SYMPTOMS THAT SHOULD BE REPORTED IMMEDIATELY: *FEVER GREATER THAN 100.4 F (38 C) OR HIGHER *CHILLS OR SWEATING *NAUSEA AND VOMITING THAT IS NOT CONTROLLED WITH YOUR NAUSEA MEDICATION *UNUSUAL SHORTNESS OF BREATH *UNUSUAL BRUISING OR BLEEDING *URINARY PROBLEMS (pain or burning when urinating, or frequent urination) *BOWEL PROBLEMS (unusual diarrhea, constipation, pain near the anus) TENDERNESS IN MOUTH AND THROAT WITH OR WITHOUT PRESENCE OF ULCERS (sore throat, sores in mouth, or a toothache) UNUSUAL RASH, SWELLING OR PAIN  UNUSUAL VAGINAL DISCHARGE OR ITCHING   Items with * indicate a potential emergency and should be followed up as soon as possible or go to the Emergency Department if any problems should occur.  Please show the CHEMOTHERAPY ALERT CARD or IMMUNOTHERAPY ALERT CARD at  check-in to the Emergency Department and triage nurse.  Should you have questions after your visit or need to cancel or reschedule your appointment, please contact [facility]  Dept: [phone]  and follow the prompts.  Office hours are 8:00 a.m. to 4:30 p.m. Monday - Friday. Please note that voicemails left after 4:00 p.m. may not be returned until the following business day.  We are closed weekends and major holidays. You have access to a nurse at all times for urgent questions. Please call the main number to the clinic Dept: [phone] and follow the prompts.   For any non-urgent questions, you may also contact your provider using MyChart. We now offer e-Visits for anyone 78 and older to request care online for non-urgent symptoms. For details visit [email or web address].   Also download the MyChart app! Go to the app store, search "MyChart", open the app, select [facility], and log in with your MyChart username and password.  Masks are optional in the cancer centers. If you would like for your care team to wear a mask while they are taking care of you, please let them know. For doctor visits, patients may have with them one support person who is at least 78 years old. At this time, visitors are not allowed in the infusion area.

## 2022-06-02 NOTE — Progress Notes (Signed)
Per Cassandra Heilingoetter, PA-C - patient will not be receiving Zometa at this time.

## 2022-06-30 ENCOUNTER — Inpatient Hospital Stay: Payer: Medicare Other | Attending: Internal Medicine

## 2022-06-30 ENCOUNTER — Inpatient Hospital Stay (HOSPITAL_BASED_OUTPATIENT_CLINIC_OR_DEPARTMENT_OTHER): Payer: Medicare Other | Admitting: Internal Medicine

## 2022-06-30 ENCOUNTER — Inpatient Hospital Stay: Payer: Medicare Other

## 2022-06-30 ENCOUNTER — Other Ambulatory Visit: Payer: Self-pay

## 2022-06-30 VITALS — BP 140/68 | HR 71 | Temp 97.8°F | Resp 17 | Wt 161.2 lb

## 2022-06-30 DIAGNOSIS — C439 Malignant melanoma of skin, unspecified: Secondary | ICD-10-CM | POA: Insufficient documentation

## 2022-06-30 DIAGNOSIS — J90 Pleural effusion, not elsewhere classified: Secondary | ICD-10-CM | POA: Insufficient documentation

## 2022-06-30 DIAGNOSIS — C7801 Secondary malignant neoplasm of right lung: Secondary | ICD-10-CM | POA: Diagnosis not present

## 2022-06-30 DIAGNOSIS — C7800 Secondary malignant neoplasm of unspecified lung: Secondary | ICD-10-CM

## 2022-06-30 DIAGNOSIS — Z95828 Presence of other vascular implants and grafts: Secondary | ICD-10-CM

## 2022-06-30 DIAGNOSIS — Z79899 Other long term (current) drug therapy: Secondary | ICD-10-CM | POA: Insufficient documentation

## 2022-06-30 DIAGNOSIS — Z5112 Encounter for antineoplastic immunotherapy: Secondary | ICD-10-CM | POA: Insufficient documentation

## 2022-06-30 LAB — CBC WITH DIFFERENTIAL (CANCER CENTER ONLY)
Abs Immature Granulocytes: 0.02 10*3/uL (ref 0.00–0.07)
Basophils Absolute: 0 10*3/uL (ref 0.0–0.1)
Basophils Relative: 0 %
Eosinophils Absolute: 0.2 10*3/uL (ref 0.0–0.5)
Eosinophils Relative: 4 %
HCT: 26.4 % — ABNORMAL LOW (ref 39.0–52.0)
Hemoglobin: 9.2 g/dL — ABNORMAL LOW (ref 13.0–17.0)
Immature Granulocytes: 1 %
Lymphocytes Relative: 26 %
Lymphs Abs: 1.1 10*3/uL (ref 0.7–4.0)
MCH: 33.9 pg (ref 26.0–34.0)
MCHC: 34.8 g/dL (ref 30.0–36.0)
MCV: 97.4 fL (ref 80.0–100.0)
Monocytes Absolute: 0.5 10*3/uL (ref 0.1–1.0)
Monocytes Relative: 12 %
Neutro Abs: 2.5 10*3/uL (ref 1.7–7.7)
Neutrophils Relative %: 57 %
Platelet Count: 173 10*3/uL (ref 150–400)
RBC: 2.71 MIL/uL — ABNORMAL LOW (ref 4.22–5.81)
RDW: 12.6 % (ref 11.5–15.5)
WBC Count: 4.3 10*3/uL (ref 4.0–10.5)
nRBC: 0 % (ref 0.0–0.2)

## 2022-06-30 LAB — CMP (CANCER CENTER ONLY)
ALT: 17 U/L (ref 0–44)
AST: 17 U/L (ref 15–41)
Albumin: 4 g/dL (ref 3.5–5.0)
Alkaline Phosphatase: 38 U/L (ref 38–126)
Anion gap: 6 (ref 5–15)
BUN: 22 mg/dL (ref 8–23)
CO2: 24 mmol/L (ref 22–32)
Calcium: 9.4 mg/dL (ref 8.9–10.3)
Chloride: 103 mmol/L (ref 98–111)
Creatinine: 1 mg/dL (ref 0.61–1.24)
GFR, Estimated: >60 mL/min (ref >60)
Glucose, Bld: 192 mg/dL — ABNORMAL HIGH (ref 70–99)
Potassium: 4.2 mmol/L (ref 3.5–5.1)
Sodium: 133 mmol/L — ABNORMAL LOW (ref 135–145)
Total Bilirubin: 0.5 mg/dL (ref 0.3–1.2)
Total Protein: 6.8 g/dL (ref 6.5–8.1)

## 2022-06-30 LAB — TSH: TSH: 4.739 u[IU]/mL — ABNORMAL HIGH (ref 0.350–4.500)

## 2022-06-30 MED ORDER — SODIUM CHLORIDE 0.9% FLUSH
10.0000 mL | INTRAVENOUS | Status: DC | PRN
  Administered 2022-06-30: 10 mL

## 2022-06-30 MED ORDER — SODIUM CHLORIDE 0.9% FLUSH
10.0000 mL | Freq: Once | INTRAVENOUS | Status: AC
  Administered 2022-06-30: 10 mL

## 2022-06-30 MED ORDER — SODIUM CHLORIDE 0.9 % IV SOLN
480.0000 mg | Freq: Once | INTRAVENOUS | Status: AC
  Administered 2022-06-30: 480 mg via INTRAVENOUS
  Filled 2022-06-30: qty 48

## 2022-06-30 MED ORDER — HEPARIN SOD (PORK) LOCK FLUSH 100 UNIT/ML IV SOLN
500.0000 [IU] | Freq: Once | INTRAVENOUS | Status: AC | PRN
  Administered 2022-06-30: 500 [IU]

## 2022-06-30 MED ORDER — SODIUM CHLORIDE 0.9 % IV SOLN: Freq: Once | INTRAVENOUS | Status: AC
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
## 2022-07-05 ENCOUNTER — Other Ambulatory Visit: Payer: Self-pay

## 2022-07-13 ENCOUNTER — Telehealth: Payer: Self-pay | Admitting: Internal Medicine

## 2022-07-13 ENCOUNTER — Other Ambulatory Visit: Payer: Self-pay

## 2022-07-13 NOTE — Telephone Encounter (Signed)
Called patient regarding updated upcoming appointments, patient has been called and voicemail was left.
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
## 2022-07-26 ENCOUNTER — Ambulatory Visit (HOSPITAL_COMMUNITY)
Admission: RE | Admit: 2022-07-26 | Discharge: 2022-07-26 | Disposition: A | Discharge status: Home / Self Care | Payer: Medicare Other | Source: Ambulatory Visit | Attending: Internal Medicine | Admitting: Internal Medicine

## 2022-07-26 DIAGNOSIS — C439 Malignant melanoma of skin, unspecified: Secondary | ICD-10-CM | POA: Diagnosis present

## 2022-07-26 DIAGNOSIS — C7800 Secondary malignant neoplasm of unspecified lung: Secondary | ICD-10-CM | POA: Diagnosis present

## 2022-07-26 MED ORDER — IOHEXOL 300 MG/ML  SOLN
100.0000 mL | Freq: Once | INTRAMUSCULAR | Status: AC | PRN
  Administered 2022-07-26: 100 mL via INTRAVENOUS

## 2022-07-26 MED ORDER — IOHEXOL 9 MG/ML PO SOLN
1000.0000 mL | Freq: Once | ORAL | Status: AC
Start: 2022-07-26 — End: 2022-07-26
  Administered 2022-07-26: 1000 mL via ORAL

## 2022-07-28 ENCOUNTER — Other Ambulatory Visit: Payer: Self-pay

## 2022-07-28 ENCOUNTER — Inpatient Hospital Stay: Payer: Medicare Other

## 2022-07-28 ENCOUNTER — Inpatient Hospital Stay: Payer: Medicare Other | Attending: Internal Medicine | Admitting: Physician Assistant

## 2022-07-28 ENCOUNTER — Encounter: Payer: Self-pay | Admitting: Physician Assistant

## 2022-07-28 ENCOUNTER — Other Ambulatory Visit: Payer: Self-pay | Admitting: Internal Medicine

## 2022-07-28 VITALS — BP 158/81 | HR 77 | Temp 97.7°F | Resp 14 | Wt 162.4 lb

## 2022-07-28 DIAGNOSIS — Z95828 Presence of other vascular implants and grafts: Secondary | ICD-10-CM

## 2022-07-28 DIAGNOSIS — Z5112 Encounter for antineoplastic immunotherapy: Secondary | ICD-10-CM | POA: Diagnosis present

## 2022-07-28 DIAGNOSIS — C7800 Secondary malignant neoplasm of unspecified lung: Secondary | ICD-10-CM | POA: Diagnosis not present

## 2022-07-28 DIAGNOSIS — C439 Malignant melanoma of skin, unspecified: Secondary | ICD-10-CM | POA: Diagnosis present

## 2022-07-28 DIAGNOSIS — Z79899 Other long term (current) drug therapy: Secondary | ICD-10-CM | POA: Insufficient documentation

## 2022-07-28 DIAGNOSIS — C7801 Secondary malignant neoplasm of right lung: Secondary | ICD-10-CM | POA: Insufficient documentation

## 2022-07-28 LAB — CBC WITH DIFFERENTIAL (CANCER CENTER ONLY)
Abs Immature Granulocytes: 0.02 10*3/uL (ref 0.00–0.07)
Basophils Absolute: 0 10*3/uL (ref 0.0–0.1)
Basophils Relative: 1 %
Eosinophils Absolute: 0.2 10*3/uL (ref 0.0–0.5)
Eosinophils Relative: 6 %
HCT: 27.1 % — ABNORMAL LOW (ref 39.0–52.0)
Hemoglobin: 9.5 g/dL — ABNORMAL LOW (ref 13.0–17.0)
Immature Granulocytes: 1 %
Lymphocytes Relative: 32 %
Lymphs Abs: 1.2 10*3/uL (ref 0.7–4.0)
MCH: 33.6 pg (ref 26.0–34.0)
MCHC: 35.1 g/dL (ref 30.0–36.0)
MCV: 95.8 fL (ref 80.0–100.0)
Monocytes Absolute: 0.5 10*3/uL (ref 0.1–1.0)
Monocytes Relative: 13 %
Neutro Abs: 1.8 10*3/uL (ref 1.7–7.7)
Neutrophils Relative %: 47 %
Platelet Count: 176 10*3/uL (ref 150–400)
RBC: 2.83 MIL/uL — ABNORMAL LOW (ref 4.22–5.81)
RDW: 12.5 % (ref 11.5–15.5)
WBC Count: 3.7 10*3/uL — ABNORMAL LOW (ref 4.0–10.5)
nRBC: 0 % (ref 0.0–0.2)

## 2022-07-28 LAB — CMP (CANCER CENTER ONLY)
ALT: 21 U/L (ref 0–44)
AST: 21 U/L (ref 15–41)
Albumin: 4.2 g/dL (ref 3.5–5.0)
Alkaline Phosphatase: 42 U/L (ref 38–126)
Anion gap: 7 (ref 5–15)
BUN: 20 mg/dL (ref 8–23)
CO2: 24 mmol/L (ref 22–32)
Calcium: 9.8 mg/dL (ref 8.9–10.3)
Chloride: 104 mmol/L (ref 98–111)
Creatinine: 1.05 mg/dL (ref 0.61–1.24)
GFR, Estimated: >60 mL/min (ref >60)
Glucose, Bld: 147 mg/dL — ABNORMAL HIGH (ref 70–99)
Potassium: 4.4 mmol/L (ref 3.5–5.1)
Sodium: 135 mmol/L (ref 135–145)
Total Bilirubin: 0.4 mg/dL (ref 0.3–1.2)
Total Protein: 7.3 g/dL (ref 6.5–8.1)

## 2022-07-28 LAB — TSH: TSH: 4.986 u[IU]/mL — ABNORMAL HIGH (ref 0.350–4.500)

## 2022-07-28 MED ORDER — SODIUM CHLORIDE 0.9 % IV SOLN
480.0000 mg | Freq: Once | INTRAVENOUS | Status: AC
  Administered 2022-07-28: 480 mg via INTRAVENOUS
  Filled 2022-07-28: qty 48

## 2022-07-28 MED ORDER — SODIUM CHLORIDE 0.9 % IV SOLN: Freq: Once | INTRAVENOUS | Status: AC

## 2022-07-28 MED ORDER — SODIUM CHLORIDE 0.9% FLUSH
10.0000 mL | Freq: Once | INTRAVENOUS | Status: AC
  Administered 2022-07-28: 10 mL

## 2022-07-28 MED ORDER — SODIUM CHLORIDE 0.9% FLUSH
10.0000 mL | INTRAVENOUS | Status: DC | PRN
  Administered 2022-07-28: 10 mL

## 2022-07-28 MED ORDER — HEPARIN SOD (PORK) LOCK FLUSH 100 UNIT/ML IV SOLN
500.0000 [IU] | Freq: Once | INTRAVENOUS | Status: AC | PRN
  Administered 2022-07-28: 500 [IU]

## 2022-07-28 NOTE — Patient Instructions (Addendum)
[facility]  Discharge Instructions: Thank you for choosing [facility] to provide your oncology and hematology care.   If you have a lab appointment with the [facility], please go directly to the [facility] and check in at the registration area.   Wear comfortable clothing and clothing appropriate for easy access to any Portacath or PICC line.   We strive to give you quality time with your provider. You may need to reschedule your appointment if you arrive late (15 or more minutes).  Arriving late affects you and other patients whose appointments are after yours.  Also, if you miss three or more appointments without notifying the office, you may be dismissed from the clinic at the provider's discretion.      For prescription refill requests, have your pharmacy contact our office and allow 72 hours for refills to be completed.    Today you received the following chemotherapy and/or immunotherapy agents: Opdivo.      To help prevent nausea and vomiting after your treatment, we encourage you to take your nausea medication as directed.  BELOW ARE SYMPTOMS THAT SHOULD BE REPORTED IMMEDIATELY: *FEVER GREATER THAN 100.4 F (38 C) OR HIGHER *CHILLS OR SWEATING *NAUSEA AND VOMITING THAT IS NOT CONTROLLED WITH YOUR NAUSEA MEDICATION *UNUSUAL SHORTNESS OF BREATH *UNUSUAL BRUISING OR BLEEDING *URINARY PROBLEMS (pain or burning when urinating, or frequent urination) *BOWEL PROBLEMS (unusual diarrhea, constipation, pain near the anus) TENDERNESS IN MOUTH AND THROAT WITH OR WITHOUT PRESENCE OF ULCERS (sore throat, sores in mouth, or a toothache) UNUSUAL RASH, SWELLING OR PAIN  UNUSUAL VAGINAL DISCHARGE OR ITCHING   Items with * indicate a potential emergency and should be followed up as soon as possible or go to the Emergency Department if any problems should occur.  Please show the CHEMOTHERAPY ALERT CARD or IMMUNOTHERAPY ALERT CARD at check-in to  the Emergency Department and triage nurse.  Should you have questions after your visit or need to cancel or reschedule your appointment, please contact [facility]  Dept: [phone]  and follow the prompts.  Office hours are 8:00 a.m. to 4:30 p.m. Monday - Friday. Please note that voicemails left after 4:00 p.m. may not be returned until the following business day.  We are closed weekends and major holidays. You have access to a nurse at all times for urgent questions. Please call the main number to the clinic Dept: [phone] and follow the prompts.   For any non-urgent questions, you may also contact your provider using MyChart. We now offer e-Visits for anyone 24 and older to request care online for non-urgent symptoms. For details visit [email or web address].   Also download the MyChart app! Go to the app store, search "MyChart", open the app, select [facility], and log in with your MyChart username and password.  Masks are optional in the cancer centers. If you would like for your care team to wear a mask while they are taking care of you, please let them know. You may have one support person who is at least 78 years old accompany you for your appointments. Nivolumab Injection What is this medication? NIVOLUMAB (nye VOL ue mab) treats some types of cancer. It works by helping your immune system slow or stop the spread of cancer cells. It is a monoclonal antibody. This medicine may be used for other purposes; ask your health care provider or pharmacist if you have questions. COMMON BRAND NAME(S): Opdivo What should I  tell my care team before I take this medication? They need to know if you have any of these conditions: Allogeneic stem cell transplant (uses someone else's stem cells) Autoimmune diseases, such as Crohn disease, ulcerative colitis, lupus History of chest radiation Nervous system problems, such as Guillain-Barre syndrome or myasthenia  gravis Organ transplant An unusual or allergic reaction to nivolumab, other medications, foods, dyes, or preservatives Pregnant or trying to get pregnant Breast-feeding How should I use this medication? This medication is infused into a vein. It is given in a hospital or clinic setting. A special MedGuide will be given to you before each treatment. Be sure to read this information carefully each time. Talk to your care team about the use of this medication in children. While it may be prescribed for children as young as 12 years for selected conditions, precautions do apply. Overdosage: If you think you have taken too much of this medicine contact a poison control center or emergency room at once. NOTE: This medicine is only for you. Do not share this medicine with others. What if I miss a dose? Keep appointments for follow-up doses. It is important not to miss your dose. Call your care team if you are unable to keep an appointment. What may interact with this medication? Interactions have not been studied. This list may not describe all possible interactions. Give your health care provider a list of all the medicines, herbs, non-prescription drugs, or dietary supplements you use. Also tell them if you smoke, drink alcohol, or use illegal drugs. Some items may interact with your medicine. What should I watch for while using this medication? Your condition will be monitored carefully while you are receiving this medication. You may need blood work while taking this medication. This medication may cause serious skin reactions. They can happen weeks to months after starting the medication. Contact your care team right away if you notice fevers or flu-like symptoms with a rash. The rash may be red or purple and then turn into blisters or peeling of the skin. You may also notice a red rash with swelling of the face, lips, or lymph nodes in your neck or under your arms. Tell your care team right away if  you have any change in your eyesight. Talk to your care team if you are pregnant or think you might be pregnant. A negative pregnancy test is required before starting this medication. A reliable form of contraception is recommended while taking this medication and for 5 months after the last dose. Talk to your care team about effective forms of contraception. Do not breast-feed while taking this medication and for 5 months after the last dose. What side effects may I notice from receiving this medication? Side effects that you should report to your care team as soon as possible: Allergic reactions--skin rash, itching, hives, swelling of the face, lips, tongue, or throat Dry cough, shortness of breath or trouble breathing Eye pain, redness, irritation, or discharge with blurry or decreased vision Heart muscle inflammation--unusual weakness or fatigue, shortness of breath, chest pain, fast or irregular heartbeat, dizziness, swelling of the ankles, feet, or hands Hormone gland problems--headache, sensitivity to light, unusual weakness or fatigue, dizziness, fast or irregular heartbeat, increased sensitivity to cold or heat, excessive sweating, constipation, hair loss, increased thirst or amount of urine, tremors or shaking, irritability Infusion reactions--chest pain, shortness of breath or trouble breathing, feeling faint or lightheaded Kidney injury (glomerulonephritis)--decrease in the amount of urine, red or dark brown urine,  foamy or bubbly urine, swelling of the ankles, hands, or feet Liver injury--right upper belly pain, loss of appetite, nausea, light-colored stool, dark yellow or brown urine, yellowing skin or eyes, unusual weakness or fatigue Pain, tingling, or numbness in the hands or feet, muscle weakness, change in vision, confusion or trouble speaking, loss of balance or coordination, trouble walking, seizures Rash, fever, and swollen lymph nodes Redness, blistering, peeling, or loosening  of the skin, including inside the mouth Sudden or severe stomach pain, bloody diarrhea, fever, nausea, vomiting Side effects that usually do not require medical attention (report these to your care team if they continue or are bothersome): Bone, joint, or muscle pain Diarrhea Fatigue Loss of appetite Nausea Skin rash This list may not describe all possible side effects. Call your doctor for medical advice about side effects. You may report side effects to FDA at 1-800-FDA-1088. Where should I keep my medication? This medication is given in a hospital or clinic. It will not be stored at home. NOTE: This sheet is a summary. It may not cover all possible information. If you have questions about this medicine, talk to your doctor, pharmacist, or health care provider.  2023 Elsevier/Gold Standard (2021-10-30 00:00:00)

## 2022-07-30 ENCOUNTER — Other Ambulatory Visit: Payer: Self-pay
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
## 2022-08-08 ENCOUNTER — Other Ambulatory Visit: Payer: Self-pay

## 2022-08-11 ENCOUNTER — Ambulatory Visit: Payer: Medicare Other | Admitting: Pulmonary Disease

## 2022-08-11 ENCOUNTER — Ambulatory Visit: Payer: Medicare Other | Attending: Cardiovascular Disease | Admitting: Cardiovascular Disease

## 2022-08-11 ENCOUNTER — Encounter: Payer: Self-pay | Admitting: Pulmonary Disease

## 2022-08-11 ENCOUNTER — Encounter: Payer: Self-pay | Admitting: Cardiovascular Disease

## 2022-08-11 VITALS — BP 110/76 | HR 85 | Ht 71.0 in | Wt 163.0 lb

## 2022-08-11 VITALS — BP 128/64 | HR 90 | Temp 98.5°F | Ht 71.0 in | Wt 163.8 lb

## 2022-08-11 DIAGNOSIS — C439 Malignant melanoma of skin, unspecified: Secondary | ICD-10-CM | POA: Diagnosis not present

## 2022-08-11 DIAGNOSIS — Z87891 Personal history of nicotine dependence: Secondary | ICD-10-CM

## 2022-08-11 DIAGNOSIS — C7800 Secondary malignant neoplasm of unspecified lung: Secondary | ICD-10-CM

## 2022-08-11 DIAGNOSIS — E782 Mixed hyperlipidemia: Secondary | ICD-10-CM | POA: Diagnosis not present

## 2022-08-11 DIAGNOSIS — I4819 Other persistent atrial fibrillation: Secondary | ICD-10-CM

## 2022-08-11 MED ORDER — METOPROLOL SUCCINATE ER 25 MG PO TB24: 25.0000 mg | ORAL_TABLET | Freq: Every day | ORAL | 3 refills | Status: DC

## 2022-08-11 NOTE — Patient Instructions (Addendum)
Medication Instructions:  Your physician has recommended you make the following change in your medication:  1-Decrease Metoprolol 25 mg by mouth daily.  *If you need a refill on your cardiac medications before your next appointment, please call your pharmacy*  Lab Work: If you have labs (blood work) drawn today and your tests are completely normal, you will receive your results only by: [facility] (if you have MyChart) OR A paper copy in the mail If you have any lab test that is abnormal or we need to change your treatment, we will call you to review the results.  Testing/Procedures: None ordered today.  Follow-Up: At [facility], you and your health needs are our priority.  As part of our continuing mission to provide you with exceptional heart care, we have created designated Provider Care Teams.  These Care Teams include your primary Cardiologist (physician) and Advanced Practice Providers (APPs -  Physician Assistants and Nurse Practitioners) who all work together to provide you with the care you need, when you need it.  We recommend signing up for the patient portal called "MyChart".  Sign up information is provided on this After Visit Summary.  MyChart is used to connect with patients for Virtual Visits (Telemedicine).  Patients are able to view lab/test results, encounter notes, upcoming appointments, etc.  Non-urgent messages can be sent to your provider as well.   To learn more about what you can do with MyChart, go to [email or web address].    Your next appointment:   6 month(s)  The format for your next appointment:   In Person  Provider:   Jenkins Rouge, MD     Important Information About Sugar

## 2022-08-11 NOTE — Patient Instructions (Signed)
Thank you for visiting Dr. Lottie Sigman at [facility]. Today we recommend the following:  Return if symptoms worsen or fail to improve.    Please do your part to reduce the spread of COVID-19.  
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
## 2022-08-12 ENCOUNTER — Other Ambulatory Visit: Payer: Self-pay

## 2022-08-19 ENCOUNTER — Other Ambulatory Visit: Payer: Self-pay | Admitting: Pharmacist

## 2022-08-19 DIAGNOSIS — I480 Paroxysmal atrial fibrillation: Secondary | ICD-10-CM

## 2022-08-19 MED ORDER — APIXABAN 5 MG PO TABS: 5.0000 mg | ORAL_TABLET | Freq: Two times a day (BID) | ORAL | 3 refills | Status: DC

## 2022-08-25 ENCOUNTER — Ambulatory Visit: Payer: Medicare Other | Admitting: Physician Assistant

## 2022-08-25 ENCOUNTER — Other Ambulatory Visit: Payer: Medicare Other

## 2022-08-25 ENCOUNTER — Ambulatory Visit: Payer: Medicare Other
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
## 2022-08-26 ENCOUNTER — Inpatient Hospital Stay: Payer: Medicare Other | Attending: Internal Medicine

## 2022-08-26 ENCOUNTER — Inpatient Hospital Stay: Payer: Medicare Other

## 2022-08-26 ENCOUNTER — Inpatient Hospital Stay: Payer: Medicare Other | Admitting: Physician Assistant

## 2022-08-26 VITALS — BP 138/72 | HR 69 | Temp 97.7°F | Resp 15 | Wt 162.6 lb

## 2022-08-26 DIAGNOSIS — Z95828 Presence of other vascular implants and grafts: Secondary | ICD-10-CM

## 2022-08-26 DIAGNOSIS — C7800 Secondary malignant neoplasm of unspecified lung: Secondary | ICD-10-CM

## 2022-08-26 DIAGNOSIS — J90 Pleural effusion, not elsewhere classified: Secondary | ICD-10-CM | POA: Diagnosis not present

## 2022-08-26 DIAGNOSIS — C439 Malignant melanoma of skin, unspecified: Secondary | ICD-10-CM | POA: Insufficient documentation

## 2022-08-26 DIAGNOSIS — Z5112 Encounter for antineoplastic immunotherapy: Secondary | ICD-10-CM | POA: Insufficient documentation

## 2022-08-26 DIAGNOSIS — Z79899 Other long term (current) drug therapy: Secondary | ICD-10-CM | POA: Diagnosis not present

## 2022-08-26 DIAGNOSIS — R21 Rash and other nonspecific skin eruption: Secondary | ICD-10-CM | POA: Diagnosis not present

## 2022-08-26 DIAGNOSIS — C7801 Secondary malignant neoplasm of right lung: Secondary | ICD-10-CM | POA: Diagnosis not present

## 2022-08-26 LAB — CBC WITH DIFFERENTIAL (CANCER CENTER ONLY)
Abs Immature Granulocytes: 0.02 10*3/uL (ref 0.00–0.07)
Basophils Absolute: 0 10*3/uL (ref 0.0–0.1)
Basophils Relative: 0 %
Eosinophils Absolute: 0.2 10*3/uL (ref 0.0–0.5)
Eosinophils Relative: 4 %
HCT: 29.2 % — ABNORMAL LOW (ref 39.0–52.0)
Hemoglobin: 9.8 g/dL — ABNORMAL LOW (ref 13.0–17.0)
Immature Granulocytes: 0 %
Lymphocytes Relative: 30 %
Lymphs Abs: 1.4 10*3/uL (ref 0.7–4.0)
MCH: 33.7 pg (ref 26.0–34.0)
MCHC: 33.6 g/dL (ref 30.0–36.0)
MCV: 100.3 fL — ABNORMAL HIGH (ref 80.0–100.0)
Monocytes Absolute: 0.6 10*3/uL (ref 0.1–1.0)
Monocytes Relative: 12 %
Neutro Abs: 2.5 10*3/uL (ref 1.7–7.7)
Neutrophils Relative %: 54 %
Platelet Count: 164 10*3/uL (ref 150–400)
RBC: 2.91 MIL/uL — ABNORMAL LOW (ref 4.22–5.81)
RDW: 12.2 % (ref 11.5–15.5)
WBC Count: 4.6 10*3/uL (ref 4.0–10.5)
nRBC: 0 % (ref 0.0–0.2)

## 2022-08-26 LAB — CMP (CANCER CENTER ONLY)
ALT: 15 U/L (ref 0–44)
AST: 15 U/L (ref 15–41)
Albumin: 4.1 g/dL (ref 3.5–5.0)
Alkaline Phosphatase: 38 U/L (ref 38–126)
Anion gap: 5 (ref 5–15)
BUN: 20 mg/dL (ref 8–23)
CO2: 25 mmol/L (ref 22–32)
Calcium: 9.4 mg/dL (ref 8.9–10.3)
Chloride: 103 mmol/L (ref 98–111)
Creatinine: 0.99 mg/dL (ref 0.61–1.24)
GFR, Estimated: >60 mL/min (ref >60)
Glucose, Bld: 263 mg/dL — ABNORMAL HIGH (ref 70–99)
Potassium: 4.3 mmol/L (ref 3.5–5.1)
Sodium: 133 mmol/L — ABNORMAL LOW (ref 135–145)
Total Bilirubin: 0.3 mg/dL (ref 0.3–1.2)
Total Protein: 6.7 g/dL (ref 6.5–8.1)

## 2022-08-26 LAB — TSH: TSH: 2.712 u[IU]/mL (ref 0.350–4.500)

## 2022-08-26 MED ORDER — SODIUM CHLORIDE 0.9% FLUSH
10.0000 mL | INTRAVENOUS | Status: DC | PRN
  Administered 2022-08-26: 10 mL

## 2022-08-26 MED ORDER — SODIUM CHLORIDE 0.9 % IV SOLN: Freq: Once | INTRAVENOUS | Status: AC

## 2022-08-26 MED ORDER — SODIUM CHLORIDE 0.9% FLUSH
10.0000 mL | Freq: Once | INTRAVENOUS | Status: AC
  Administered 2022-08-26: 10 mL

## 2022-08-26 MED ORDER — HEPARIN SOD (PORK) LOCK FLUSH 100 UNIT/ML IV SOLN
500.0000 [IU] | Freq: Once | INTRAVENOUS | Status: AC | PRN
  Administered 2022-08-26: 500 [IU]

## 2022-08-26 MED ORDER — SODIUM CHLORIDE 0.9 % IV SOLN
480.0000 mg | Freq: Once | INTRAVENOUS | Status: AC
  Administered 2022-08-26: 480 mg via INTRAVENOUS
  Filled 2022-08-26: qty 48

## 2022-08-26 NOTE — Patient Instructions (Signed)
[facility] ONCOLOGY  Discharge Instructions: Thank you for choosing [facility] to provide your oncology and hematology care.   If you have a lab appointment with the [facility], please go directly to the [facility] and check in at the registration area.   Wear comfortable clothing and clothing appropriate for easy access to any Portacath or PICC line.   We strive to give you quality time with your provider. You may need to reschedule your appointment if you arrive late (15 or more minutes).  Arriving late affects you and other patients whose appointments are after yours.  Also, if you miss three or more appointments without notifying the office, you may be dismissed from the clinic at the provider's discretion.      For prescription refill requests, have your pharmacy contact our office and allow 72 hours for refills to be completed.    Today you received the following chemotherapy and/or immunotherapy agent: Nivolumab (Opdivo)   To help prevent nausea and vomiting after your treatment, we encourage you to take your nausea medication as directed.  BELOW ARE SYMPTOMS THAT SHOULD BE REPORTED IMMEDIATELY: *FEVER GREATER THAN 100.4 F (38 C) OR HIGHER *CHILLS OR SWEATING *NAUSEA AND VOMITING THAT IS NOT CONTROLLED WITH YOUR NAUSEA MEDICATION *UNUSUAL SHORTNESS OF BREATH *UNUSUAL BRUISING OR BLEEDING *URINARY PROBLEMS (pain or burning when urinating, or frequent urination) *BOWEL PROBLEMS (unusual diarrhea, constipation, pain near the anus) TENDERNESS IN MOUTH AND THROAT WITH OR WITHOUT PRESENCE OF ULCERS (sore throat, sores in mouth, or a toothache) UNUSUAL RASH, SWELLING OR PAIN  UNUSUAL VAGINAL DISCHARGE OR ITCHING   Items with * indicate a potential emergency and should be followed up as soon as possible or go to the Emergency Department if any problems should occur.  Please show the CHEMOTHERAPY ALERT CARD or IMMUNOTHERAPY ALERT CARD at  check-in to the Emergency Department and triage nurse.  Should you have questions after your visit or need to cancel or reschedule your appointment, please contact [facility]  Dept: [phone]  and follow the prompts.  Office hours are 8:00 a.m. to 4:30 p.m. Monday - Friday. Please note that voicemails left after 4:00 p.m. may not be returned until the following business day.  We are closed weekends and major holidays. You have access to a nurse at all times for urgent questions. Please call the main number to the clinic Dept: [phone] and follow the prompts.   For any non-urgent questions, you may also contact your provider using MyChart. We now offer e-Visits for anyone 78 and older to request care online for non-urgent symptoms. For details visit [email or web address].   Also download the MyChart app! Go to the app store, search "MyChart", open the app, select [facility], and log in with your MyChart username and password.  Masks are optional in the cancer centers. If you would like for your care team to wear a mask while they are taking care of you, please let them know. You may have one support person who is at least 78 years old accompany you for your appointments. Nivolumab Injection What is this medication? NIVOLUMAB (nye VOL ue mab) treats some types of cancer. It works by helping your immune system slow or stop the spread of cancer cells. It is a monoclonal antibody. This medicine may be used for other purposes; ask your health care provider or pharmacist if you have questions. COMMON BRAND NAME(S): Opdivo What should I tell my  care team before I take this medication? They need to know if you have any of these conditions: Allogeneic stem cell transplant (uses someone else's stem cells) Autoimmune diseases, such as Crohn disease, ulcerative colitis, lupus History of chest radiation Nervous system problems, such as Guillain-Barre syndrome or  myasthenia gravis Organ transplant An unusual or allergic reaction to nivolumab, other medications, foods, dyes, or preservatives Pregnant or trying to get pregnant Breast-feeding How should I use this medication? This medication is infused into a vein. It is given in a hospital or clinic setting. A special MedGuide will be given to you before each treatment. Be sure to read this information carefully each time. Talk to your care team about the use of this medication in children. While it may be prescribed for children as young as 12 years for selected conditions, precautions do apply. Overdosage: If you think you have taken too much of this medicine contact a poison control center or emergency room at once. NOTE: This medicine is only for you. Do not share this medicine with others. What if I miss a dose? Keep appointments for follow-up doses. It is important not to miss your dose. Call your care team if you are unable to keep an appointment. What may interact with this medication? Interactions have not been studied. This list may not describe all possible interactions. Give your health care provider a list of all the medicines, herbs, non-prescription drugs, or dietary supplements you use. Also tell them if you smoke, drink alcohol, or use illegal drugs. Some items may interact with your medicine. What should I watch for while using this medication? Your condition will be monitored carefully while you are receiving this medication. You may need blood work while taking this medication. This medication may cause serious skin reactions. They can happen weeks to months after starting the medication. Contact your care team right away if you notice fevers or flu-like symptoms with a rash. The rash may be red or purple and then turn into blisters or peeling of the skin. You may also notice a red rash with swelling of the face, lips, or lymph nodes in your neck or under your arms. Tell your care team  right away if you have any change in your eyesight. Talk to your care team if you are pregnant or think you might be pregnant. A negative pregnancy test is required before starting this medication. A reliable form of contraception is recommended while taking this medication and for 5 months after the last dose. Talk to your care team about effective forms of contraception. Do not breast-feed while taking this medication and for 5 months after the last dose. What side effects may I notice from receiving this medication? Side effects that you should report to your care team as soon as possible: Allergic reactions--skin rash, itching, hives, swelling of the face, lips, tongue, or throat Dry cough, shortness of breath or trouble breathing Eye pain, redness, irritation, or discharge with blurry or decreased vision Heart muscle inflammation--unusual weakness or fatigue, shortness of breath, chest pain, fast or irregular heartbeat, dizziness, swelling of the ankles, feet, or hands Hormone gland problems--headache, sensitivity to light, unusual weakness or fatigue, dizziness, fast or irregular heartbeat, increased sensitivity to cold or heat, excessive sweating, constipation, hair loss, increased thirst or amount of urine, tremors or shaking, irritability Infusion reactions--chest pain, shortness of breath or trouble breathing, feeling faint or lightheaded Kidney injury (glomerulonephritis)--decrease in the amount of urine, red or dark brown urine, foamy or  bubbly urine, swelling of the ankles, hands, or feet Liver injury--right upper belly pain, loss of appetite, nausea, light-colored stool, dark yellow or brown urine, yellowing skin or eyes, unusual weakness or fatigue Pain, tingling, or numbness in the hands or feet, muscle weakness, change in vision, confusion or trouble speaking, loss of balance or coordination, trouble walking, seizures Rash, fever, and swollen lymph nodes Redness, blistering, peeling,  or loosening of the skin, including inside the mouth Sudden or severe stomach pain, bloody diarrhea, fever, nausea, vomiting Side effects that usually do not require medical attention (report these to your care team if they continue or are bothersome): Bone, joint, or muscle pain Diarrhea Fatigue Loss of appetite Nausea Skin rash This list may not describe all possible side effects. Call your doctor for medical advice about side effects. You may report side effects to FDA at 1-800-FDA-1088. Where should I keep my medication? This medication is given in a hospital or clinic. It will not be stored at home. NOTE: This sheet is a summary. It may not cover all possible information. If you have questions about this medicine, talk to your doctor, pharmacist, or health care provider.  2023 Elsevier/Gold Standard (2021-10-30 00:00:00)

## 2022-08-27 LAB — T4: T4, Total: 5.5 ug/dL (ref 4.5–12.0)

## 2022-09-22 ENCOUNTER — Inpatient Hospital Stay: Payer: Medicare Other | Attending: Internal Medicine | Admitting: Internal Medicine

## 2022-09-22 ENCOUNTER — Inpatient Hospital Stay: Payer: Medicare Other

## 2022-09-22 ENCOUNTER — Encounter: Payer: Self-pay | Admitting: Internal Medicine

## 2022-09-22 ENCOUNTER — Other Ambulatory Visit: Payer: Medicare Other

## 2022-09-22 VITALS — BP 123/61 | HR 73 | Temp 98.2°F | Resp 18 | Wt 164.4 lb

## 2022-09-22 DIAGNOSIS — C7800 Secondary malignant neoplasm of unspecified lung: Secondary | ICD-10-CM

## 2022-09-22 DIAGNOSIS — Z5112 Encounter for antineoplastic immunotherapy: Secondary | ICD-10-CM | POA: Diagnosis present

## 2022-09-22 DIAGNOSIS — Z79899 Other long term (current) drug therapy: Secondary | ICD-10-CM | POA: Insufficient documentation

## 2022-09-22 DIAGNOSIS — J90 Pleural effusion, not elsewhere classified: Secondary | ICD-10-CM | POA: Diagnosis not present

## 2022-09-22 DIAGNOSIS — C439 Malignant melanoma of skin, unspecified: Secondary | ICD-10-CM | POA: Diagnosis present

## 2022-09-22 DIAGNOSIS — E119 Type 2 diabetes mellitus without complications: Secondary | ICD-10-CM | POA: Diagnosis not present

## 2022-09-22 DIAGNOSIS — Z95828 Presence of other vascular implants and grafts: Secondary | ICD-10-CM

## 2022-09-22 LAB — CBC WITH DIFFERENTIAL (CANCER CENTER ONLY)
Abs Immature Granulocytes: 0.01 10*3/uL (ref 0.00–0.07)
Basophils Absolute: 0 10*3/uL (ref 0.0–0.1)
Basophils Relative: 1 %
Eosinophils Absolute: 0.2 10*3/uL (ref 0.0–0.5)
Eosinophils Relative: 5 %
HCT: 28.9 % — ABNORMAL LOW (ref 39.0–52.0)
Hemoglobin: 10 g/dL — ABNORMAL LOW (ref 13.0–17.0)
Immature Granulocytes: 0 %
Lymphocytes Relative: 34 %
Lymphs Abs: 1.4 10*3/uL (ref 0.7–4.0)
MCH: 33.7 pg (ref 26.0–34.0)
MCHC: 34.6 g/dL (ref 30.0–36.0)
MCV: 97.3 fL (ref 80.0–100.0)
Monocytes Absolute: 0.5 10*3/uL (ref 0.1–1.0)
Monocytes Relative: 13 %
Neutro Abs: 1.9 10*3/uL (ref 1.7–7.7)
Neutrophils Relative %: 47 %
Platelet Count: 186 10*3/uL (ref 150–400)
RBC: 2.97 MIL/uL — ABNORMAL LOW (ref 4.22–5.81)
RDW: 11.9 % (ref 11.5–15.5)
WBC Count: 4 10*3/uL (ref 4.0–10.5)
nRBC: 0 % (ref 0.0–0.2)

## 2022-09-22 LAB — CMP (CANCER CENTER ONLY)
ALT: 15 U/L (ref 0–44)
AST: 16 U/L (ref 15–41)
Albumin: 3.9 g/dL (ref 3.5–5.0)
Alkaline Phosphatase: 47 U/L (ref 38–126)
Anion gap: 6 (ref 5–15)
BUN: 14 mg/dL (ref 8–23)
CO2: 25 mmol/L (ref 22–32)
Calcium: 8.9 mg/dL (ref 8.9–10.3)
Chloride: 104 mmol/L (ref 98–111)
Creatinine: 1.06 mg/dL (ref 0.61–1.24)
GFR, Estimated: >60 mL/min (ref >60)
Glucose, Bld: 193 mg/dL — ABNORMAL HIGH (ref 70–99)
Potassium: 4.3 mmol/L (ref 3.5–5.1)
Sodium: 135 mmol/L (ref 135–145)
Total Bilirubin: 0.4 mg/dL (ref 0.3–1.2)
Total Protein: 7 g/dL (ref 6.5–8.1)

## 2022-09-22 LAB — TSH: TSH: 4.793 u[IU]/mL — ABNORMAL HIGH (ref 0.350–4.500)

## 2022-09-22 MED ORDER — HEPARIN SOD (PORK) LOCK FLUSH 100 UNIT/ML IV SOLN
500.0000 [IU] | Freq: Once | INTRAVENOUS | Status: AC | PRN
  Administered 2022-09-22: 500 [IU]

## 2022-09-22 MED ORDER — SODIUM CHLORIDE 0.9% FLUSH
10.0000 mL | Freq: Once | INTRAVENOUS | Status: AC
  Administered 2022-09-22: 10 mL

## 2022-09-22 MED ORDER — SODIUM CHLORIDE 0.9 % IV SOLN: Freq: Once | INTRAVENOUS | Status: AC

## 2022-09-22 MED ORDER — SODIUM CHLORIDE 0.9% FLUSH
10.0000 mL | INTRAVENOUS | Status: DC | PRN
  Administered 2022-09-22: 10 mL

## 2022-09-22 MED ORDER — SODIUM CHLORIDE 0.9 % IV SOLN
480.0000 mg | Freq: Once | INTRAVENOUS | Status: AC
  Administered 2022-09-22: 480 mg via INTRAVENOUS
  Filled 2022-09-22: qty 48
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
## 2022-09-24 LAB — T4: T4, Total: 6.3 ug/dL (ref 4.5–12.0)
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
## 2022-10-19 ENCOUNTER — Ambulatory Visit (HOSPITAL_COMMUNITY)
Admission: RE | Admit: 2022-10-19 | Discharge: 2022-10-19 | Disposition: A | Discharge status: Home / Self Care | Payer: Medicare Other | Source: Ambulatory Visit | Attending: Internal Medicine | Admitting: Internal Medicine

## 2022-10-19 DIAGNOSIS — C7800 Secondary malignant neoplasm of unspecified lung: Secondary | ICD-10-CM | POA: Diagnosis present

## 2022-10-19 DIAGNOSIS — R918 Other nonspecific abnormal finding of lung field: Secondary | ICD-10-CM | POA: Diagnosis not present

## 2022-10-19 DIAGNOSIS — I2510 Atherosclerotic heart disease of native coronary artery without angina pectoris: Secondary | ICD-10-CM | POA: Insufficient documentation

## 2022-10-19 DIAGNOSIS — C439 Malignant melanoma of skin, unspecified: Secondary | ICD-10-CM | POA: Diagnosis not present

## 2022-10-19 DIAGNOSIS — R059 Cough, unspecified: Secondary | ICD-10-CM | POA: Insufficient documentation

## 2022-10-19 DIAGNOSIS — I700 Atherosclerosis of aorta: Secondary | ICD-10-CM | POA: Diagnosis not present

## 2022-10-19 MED ORDER — IOHEXOL 300 MG/ML  SOLN
100.0000 mL | Freq: Once | INTRAMUSCULAR | Status: AC | PRN
  Administered 2022-10-19: 100 mL via INTRAVENOUS

## 2022-10-20 ENCOUNTER — Inpatient Hospital Stay: Payer: Medicare Other | Attending: Internal Medicine

## 2022-10-20 ENCOUNTER — Inpatient Hospital Stay (HOSPITAL_BASED_OUTPATIENT_CLINIC_OR_DEPARTMENT_OTHER): Payer: Medicare Other | Admitting: Physician Assistant

## 2022-10-20 ENCOUNTER — Inpatient Hospital Stay: Payer: Medicare Other

## 2022-10-20 VITALS — BP 119/67 | HR 73 | Temp 97.5°F | Resp 16 | Wt 162.2 lb

## 2022-10-20 DIAGNOSIS — J90 Pleural effusion, not elsewhere classified: Secondary | ICD-10-CM | POA: Insufficient documentation

## 2022-10-20 DIAGNOSIS — C7800 Secondary malignant neoplasm of unspecified lung: Secondary | ICD-10-CM | POA: Diagnosis not present

## 2022-10-20 DIAGNOSIS — Z5112 Encounter for antineoplastic immunotherapy: Secondary | ICD-10-CM | POA: Insufficient documentation

## 2022-10-20 DIAGNOSIS — Z95828 Presence of other vascular implants and grafts: Secondary | ICD-10-CM

## 2022-10-20 DIAGNOSIS — C439 Malignant melanoma of skin, unspecified: Secondary | ICD-10-CM | POA: Insufficient documentation

## 2022-10-20 DIAGNOSIS — Z79899 Other long term (current) drug therapy: Secondary | ICD-10-CM | POA: Diagnosis not present

## 2022-10-20 DIAGNOSIS — R21 Rash and other nonspecific skin eruption: Secondary | ICD-10-CM | POA: Diagnosis not present

## 2022-10-20 LAB — CBC WITH DIFFERENTIAL (CANCER CENTER ONLY)
Abs Immature Granulocytes: 0.04 10*3/uL (ref 0.00–0.07)
Basophils Absolute: 0 10*3/uL (ref 0.0–0.1)
Basophils Relative: 0 %
Eosinophils Absolute: 0.2 10*3/uL (ref 0.0–0.5)
Eosinophils Relative: 4 %
HCT: 28.9 % — ABNORMAL LOW (ref 39.0–52.0)
Hemoglobin: 9.9 g/dL — ABNORMAL LOW (ref 13.0–17.0)
Immature Granulocytes: 1 %
Lymphocytes Relative: 23 %
Lymphs Abs: 1.2 10*3/uL (ref 0.7–4.0)
MCH: 33.8 pg (ref 26.0–34.0)
MCHC: 34.3 g/dL (ref 30.0–36.0)
MCV: 98.6 fL (ref 80.0–100.0)
Monocytes Absolute: 0.6 10*3/uL (ref 0.1–1.0)
Monocytes Relative: 11 %
Neutro Abs: 3.1 10*3/uL (ref 1.7–7.7)
Neutrophils Relative %: 61 %
Platelet Count: 211 10*3/uL (ref 150–400)
RBC: 2.93 MIL/uL — ABNORMAL LOW (ref 4.22–5.81)
RDW: 12.2 % (ref 11.5–15.5)
WBC Count: 5.1 10*3/uL (ref 4.0–10.5)
nRBC: 0 % (ref 0.0–0.2)

## 2022-10-20 LAB — CMP (CANCER CENTER ONLY)
ALT: 19 U/L (ref 0–44)
AST: 22 U/L (ref 15–41)
Albumin: 3.7 g/dL (ref 3.5–5.0)
Alkaline Phosphatase: 45 U/L (ref 38–126)
Anion gap: 10 (ref 5–15)
BUN: 22 mg/dL (ref 8–23)
CO2: 22 mmol/L (ref 22–32)
Calcium: 9.7 mg/dL (ref 8.9–10.3)
Chloride: 105 mmol/L (ref 98–111)
Creatinine: 1.11 mg/dL (ref 0.61–1.24)
GFR, Estimated: >60 mL/min (ref >60)
Glucose, Bld: 141 mg/dL — ABNORMAL HIGH (ref 70–99)
Potassium: 4.5 mmol/L (ref 3.5–5.1)
Sodium: 137 mmol/L (ref 135–145)
Total Bilirubin: 0.4 mg/dL (ref 0.3–1.2)
Total Protein: 7.5 g/dL (ref 6.5–8.1)

## 2022-10-20 MED ORDER — SODIUM CHLORIDE 0.9 % IV SOLN
480.0000 mg | Freq: Once | INTRAVENOUS | Status: AC
  Administered 2022-10-20: 480 mg via INTRAVENOUS
  Filled 2022-10-20: qty 48

## 2022-10-20 MED ORDER — SODIUM CHLORIDE 0.9% FLUSH
10.0000 mL | Freq: Once | INTRAVENOUS | Status: AC
  Administered 2022-10-20: 10 mL

## 2022-10-20 MED ORDER — SODIUM CHLORIDE 0.9 % IV SOLN: Freq: Once | INTRAVENOUS | Status: AC

## 2022-10-20 MED ORDER — SODIUM CHLORIDE 0.9% FLUSH
10.0000 mL | INTRAVENOUS | Status: DC | PRN
  Administered 2022-10-20: 10 mL

## 2022-10-20 MED ORDER — HEPARIN SOD (PORK) LOCK FLUSH 100 UNIT/ML IV SOLN
500.0000 [IU] | Freq: Once | INTRAVENOUS | Status: AC | PRN
  Administered 2022-10-20: 500 [IU]

## 2022-10-20 NOTE — Patient Instructions (Signed)
[facility] MEDICAL [facility]COLOGY   Discharge Instructions: Thank you for choosing [facility] to provide your oncology and hematology care.   If you have a lab appointment with the Cancer Center, please go directly to the Cancer Center and check in at the registration area.   Wear comfortable clothing and clothing appropriate for easy access to any Portacath or PICC line.   We strive to give you quality time with your provider. You may need to reschedule your appointment if you arrive late (15 or more minutes).  Arriving late affects you and other patients whose appointments are after yours.  Also, if you miss three or more appointments without notifying the office, you may be dismissed from the clinic at the provider's discretion.      For prescription refill requests, have your pharmacy contact our office and allow 72 hours for refills to be completed.    Today you received the following chemotherapy and/or immunotherapy agents: nivolumab      To help prevent nausea and vomiting after your treatment, we encourage you to take your nausea medication as directed.  BELOW ARE SYMPTOMS THAT SHOULD BE REPORTED IMMEDIATELY: *FEVER GREATER THAN 100.4 F (38 C) OR HIGHER *CHILLS OR SWEATING *NAUSEA AND VOMITING THAT IS NOT CONTROLLED WITH YOUR NAUSEA MEDICATION *UNUSUAL SHORTNESS OF BREATH *UNUSUAL BRUISING OR BLEEDING *URINARY PROBLEMS (pain or burning when urinating, or frequent urination) *BOWEL PROBLEMS (unusual diarrhea, constipation, pain near the anus) TENDERNESS IN MOUTH AND THROAT WITH OR WITHOUT PRESENCE OF ULCERS (sore throat, sores in mouth, or a toothache) UNUSUAL RASH, SWELLING OR PAIN  UNUSUAL VAGINAL DISCHARGE OR ITCHING   Items with * indicate a potential emergency and should be followed up as soon as possible or go to the Emergency Department if any problems should occur.  Please show the CHEMOTHERAPY ALERT CARD or IMMUNOTHERAPY ALERT CARD at check-in  to the Emergency Department and triage nurse.  Should you have questions after your visit or need to cancel or reschedule your appointment, please contact [facility] MEDICAL [facility]COLOG[facility]  Dept: [phone]  and follow the prompts.  Office hours are 8:00 a.m. to 4:30 p.m. Monday - Friday. Please note that voicemails left after 4:00 p.m. may not be returned until the following business day.  We are closed weekends and major holidays. You have access to a nurse at all times for urgent questions. Please call the main number to the clinic Dept: [phone] and follow the prompts.   For any non-urgent questions, you may also contact your provider using MyChart. We now offer e-Visits for anyone 18 and older to request care online for non-urgent symptoms. For details visit [email or web address].   Also download the MyChart app! Go to the app store, search "MyChart", open the app, select [facility], and log in with your MyChart username and password.  Masks are optional in the cancer centers. If you would like for your care team to wear a mask while they are taking care of you, please let them know. You may have one support person who is at least 78 years old accompany you for your appointments. 

## 2022-10-21 LAB — TSH: TSH: 4.422 u[IU]/mL (ref 0.350–4.500)

## 2022-10-21 LAB — T4: T4, Total: 6.2 ug/dL (ref 4.5–12.0)

## 2022-10-25 ENCOUNTER — Telehealth: Payer: Self-pay | Admitting: Oncology

## 2022-10-25 NOTE — Telephone Encounter (Signed)
Called patient regarding December and January appointments, patient is notified.

## 2022-11-17 ENCOUNTER — Inpatient Hospital Stay (HOSPITAL_BASED_OUTPATIENT_CLINIC_OR_DEPARTMENT_OTHER): Payer: Medicare Other | Admitting: Internal Medicine

## 2022-11-17 ENCOUNTER — Inpatient Hospital Stay: Payer: Medicare Other

## 2022-11-17 ENCOUNTER — Encounter: Payer: Self-pay | Admitting: Internal Medicine

## 2022-11-17 ENCOUNTER — Inpatient Hospital Stay: Payer: Medicare Other | Attending: Internal Medicine

## 2022-11-17 VITALS — BP 136/64 | HR 70 | Resp 18

## 2022-11-17 DIAGNOSIS — C439 Malignant melanoma of skin, unspecified: Secondary | ICD-10-CM | POA: Diagnosis present

## 2022-11-17 DIAGNOSIS — Z79899 Other long term (current) drug therapy: Secondary | ICD-10-CM | POA: Insufficient documentation

## 2022-11-17 DIAGNOSIS — C7800 Secondary malignant neoplasm of unspecified lung: Secondary | ICD-10-CM

## 2022-11-17 DIAGNOSIS — Z5112 Encounter for antineoplastic immunotherapy: Secondary | ICD-10-CM | POA: Diagnosis present

## 2022-11-17 DIAGNOSIS — Z95828 Presence of other vascular implants and grafts: Secondary | ICD-10-CM

## 2022-11-17 DIAGNOSIS — C7801 Secondary malignant neoplasm of right lung: Secondary | ICD-10-CM | POA: Insufficient documentation

## 2022-11-17 DIAGNOSIS — D649 Anemia, unspecified: Secondary | ICD-10-CM | POA: Diagnosis not present

## 2022-11-17 LAB — CBC WITH DIFFERENTIAL (CANCER CENTER ONLY)
Abs Immature Granulocytes: 0.02 10*3/uL (ref 0.00–0.07)
Basophils Absolute: 0 10*3/uL (ref 0.0–0.1)
Basophils Relative: 1 %
Eosinophils Absolute: 0.2 10*3/uL (ref 0.0–0.5)
Eosinophils Relative: 4 %
HCT: 27.6 % — ABNORMAL LOW (ref 39.0–52.0)
Hemoglobin: 9.4 g/dL — ABNORMAL LOW (ref 13.0–17.0)
Immature Granulocytes: 1 %
Lymphocytes Relative: 22 %
Lymphs Abs: 1 10*3/uL (ref 0.7–4.0)
MCH: 33.7 pg (ref 26.0–34.0)
MCHC: 34.1 g/dL (ref 30.0–36.0)
MCV: 98.9 fL (ref 80.0–100.0)
Monocytes Absolute: 0.5 10*3/uL (ref 0.1–1.0)
Monocytes Relative: 12 %
Neutro Abs: 2.7 10*3/uL (ref 1.7–7.7)
Neutrophils Relative %: 60 %
Platelet Count: 194 10*3/uL (ref 150–400)
RBC: 2.79 MIL/uL — ABNORMAL LOW (ref 4.22–5.81)
RDW: 12.2 % (ref 11.5–15.5)
WBC Count: 4.3 10*3/uL (ref 4.0–10.5)
nRBC: 0 % (ref 0.0–0.2)

## 2022-11-17 LAB — CMP (CANCER CENTER ONLY)
ALT: 16 U/L (ref 0–44)
AST: 22 U/L (ref 15–41)
Albumin: 3.8 g/dL (ref 3.5–5.0)
Alkaline Phosphatase: 49 U/L (ref 38–126)
Anion gap: 6 (ref 5–15)
BUN: 20 mg/dL (ref 8–23)
CO2: 26 mmol/L (ref 22–32)
Calcium: 9.9 mg/dL (ref 8.9–10.3)
Chloride: 102 mmol/L (ref 98–111)
Creatinine: 1.05 mg/dL (ref 0.61–1.24)
GFR, Estimated: >60 mL/min (ref >60)
Glucose, Bld: 241 mg/dL — ABNORMAL HIGH (ref 70–99)
Potassium: 4.5 mmol/L (ref 3.5–5.1)
Sodium: 134 mmol/L — ABNORMAL LOW (ref 135–145)
Total Bilirubin: 0.4 mg/dL (ref 0.3–1.2)
Total Protein: 6.8 g/dL (ref 6.5–8.1)

## 2022-11-17 LAB — TSH: TSH: 2.324 u[IU]/mL (ref 0.350–4.500)

## 2022-11-17 MED ORDER — SODIUM CHLORIDE 0.9% FLUSH
10.0000 mL | Freq: Once | INTRAVENOUS | Status: AC
  Administered 2022-11-17: 10 mL

## 2022-11-17 MED ORDER — SODIUM CHLORIDE 0.9% FLUSH
10.0000 mL | INTRAVENOUS | Status: DC | PRN
  Administered 2022-11-17: 10 mL

## 2022-11-17 MED ORDER — HEPARIN SOD (PORK) LOCK FLUSH 100 UNIT/ML IV SOLN
500.0000 [IU] | Freq: Once | INTRAVENOUS | Status: AC | PRN
  Administered 2022-11-17: 500 [IU]

## 2022-11-17 MED ORDER — SODIUM CHLORIDE 0.9 % IV SOLN: Freq: Once | INTRAVENOUS | Status: AC

## 2022-11-17 MED ORDER — SODIUM CHLORIDE 0.9 % IV SOLN
480.0000 mg | Freq: Once | INTRAVENOUS | Status: AC
  Administered 2022-11-17: 480 mg via INTRAVENOUS
  Filled 2022-11-17: qty 48

## 2022-11-17 NOTE — Patient Instructions (Signed)
[facility] MEDICAL ONCOLOGY   Discharge Instructions: Thank you for choosing [facility] to provide your oncology and hematology care.   If you have a lab appointment with the Cancer Center, please go directly to the Cancer Center and check in at the registration area.   Wear comfortable clothing and clothing appropriate for easy access to any Portacath or PICC line.   We strive to give you quality time with your provider. You may need to reschedule your appointment if you arrive late (15 or more minutes).  Arriving late affects you and other patients whose appointments are after yours.  Also, if you miss three or more appointments without notifying the office, you may be dismissed from the clinic at the provider's discretion.      For prescription refill requests, have your pharmacy contact our office and allow 72 hours for refills to be completed.    Today you received the following chemotherapy and/or immunotherapy agents: nivolumab      To help prevent nausea and vomiting after your treatment, we encourage you to take your nausea medication as directed.  BELOW ARE SYMPTOMS THAT SHOULD BE REPORTED IMMEDIATELY: *FEVER GREATER THAN 100.4 F (38 C) OR HIGHER *CHILLS OR SWEATING *NAUSEA AND VOMITING THAT IS NOT CONTROLLED WITH YOUR NAUSEA MEDICATION *UNUSUAL SHORTNESS OF BREATH *UNUSUAL BRUISING OR BLEEDING *URINARY PROBLEMS (pain or burning when urinating, or frequent urination) *BOWEL PROBLEMS (unusual diarrhea, constipation, pain near the anus) TENDERNESS IN MOUTH AND THROAT WITH OR WITHOUT PRESENCE OF ULCERS (sore throat, sores in mouth, or a toothache) UNUSUAL RASH, SWELLING OR PAIN  UNUSUAL VAGINAL DISCHARGE OR ITCHING   Items with * indicate a potential emergency and should be followed up as soon as possible or go to the Emergency Department if any problems should occur.  Please show the CHEMOTHERAPY ALERT CARD or IMMUNOTHERAPY ALERT CARD at check-in  to the Emergency Department and triage nurse.  Should you have questions after your visit or need to cancel or reschedule your appointment, please contact [facility] MEDICAL ONCOLOG[facility]  Dept: [phone]  and follow the prompts.  Office hours are 8:00 a.m. to 4:30 p.m. Monday - Friday. Please note that voicemails left after 4:00 p.m. may not be returned until the following business day.  We are closed weekends and major holidays. You have access to a nurse at all times for urgent questions. Please call the main number to the clinic Dept: [phone] and follow the prompts.   For any non-urgent questions, you may also contact your provider using MyChart. We now offer e-Visits for anyone 18 and older to request care online for non-urgent symptoms. For details visit [email or web address].   Also download the MyChart app! Go to the app store, search "MyChart", open the app, select [facility], and log in with your MyChart username and password.  Masks are optional in the cancer centers. If you would like for your care team to wear a mask while they are taking care of you, please let them know. You may have one support person who is at least 78 years old accompany you for your appointments. 
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
## 2022-11-18 LAB — T4: T4, Total: 6.7 ug/dL (ref 4.5–12.0)

## 2022-12-09 ENCOUNTER — Telehealth: Payer: Self-pay | Admitting: Internal Medicine

## 2022-12-09 NOTE — Telephone Encounter (Signed)
Called patient regarding upcoming January appointments, left a voicemail.
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
## 2022-12-15 ENCOUNTER — Inpatient Hospital Stay (HOSPITAL_BASED_OUTPATIENT_CLINIC_OR_DEPARTMENT_OTHER): Payer: Medicare Other | Admitting: Physician Assistant

## 2022-12-15 ENCOUNTER — Inpatient Hospital Stay: Payer: Medicare Other | Attending: Internal Medicine

## 2022-12-15 ENCOUNTER — Inpatient Hospital Stay: Payer: Medicare Other

## 2022-12-15 VITALS — BP 148/79 | HR 67 | Temp 97.6°F | Resp 14 | Wt 169.5 lb

## 2022-12-15 DIAGNOSIS — Z79899 Other long term (current) drug therapy: Secondary | ICD-10-CM | POA: Diagnosis not present

## 2022-12-15 DIAGNOSIS — Z5112 Encounter for antineoplastic immunotherapy: Secondary | ICD-10-CM

## 2022-12-15 DIAGNOSIS — E119 Type 2 diabetes mellitus without complications: Secondary | ICD-10-CM | POA: Insufficient documentation

## 2022-12-15 DIAGNOSIS — C7800 Secondary malignant neoplasm of unspecified lung: Secondary | ICD-10-CM

## 2022-12-15 DIAGNOSIS — I509 Heart failure, unspecified: Secondary | ICD-10-CM | POA: Insufficient documentation

## 2022-12-15 DIAGNOSIS — Z95828 Presence of other vascular implants and grafts: Secondary | ICD-10-CM

## 2022-12-15 DIAGNOSIS — C439 Malignant melanoma of skin, unspecified: Secondary | ICD-10-CM | POA: Insufficient documentation

## 2022-12-15 DIAGNOSIS — C7801 Secondary malignant neoplasm of right lung: Secondary | ICD-10-CM | POA: Diagnosis not present

## 2022-12-15 LAB — CBC WITH DIFFERENTIAL (CANCER CENTER ONLY)
Abs Immature Granulocytes: 0.03 10*3/uL (ref 0.00–0.07)
Basophils Absolute: 0 10*3/uL (ref 0.0–0.1)
Basophils Relative: 1 %
Eosinophils Absolute: 0.2 10*3/uL (ref 0.0–0.5)
Eosinophils Relative: 4 %
HCT: 29.2 % — ABNORMAL LOW (ref 39.0–52.0)
Hemoglobin: 10 g/dL — ABNORMAL LOW (ref 13.0–17.0)
Immature Granulocytes: 1 %
Lymphocytes Relative: 24 %
Lymphs Abs: 1 10*3/uL (ref 0.7–4.0)
MCH: 33.6 pg (ref 26.0–34.0)
MCHC: 34.2 g/dL (ref 30.0–36.0)
MCV: 98 fL (ref 80.0–100.0)
Monocytes Absolute: 0.5 10*3/uL (ref 0.1–1.0)
Monocytes Relative: 13 %
Neutro Abs: 2.5 10*3/uL (ref 1.7–7.7)
Neutrophils Relative %: 57 %
Platelet Count: 209 10*3/uL (ref 150–400)
RBC: 2.98 MIL/uL — ABNORMAL LOW (ref 4.22–5.81)
RDW: 12.2 % (ref 11.5–15.5)
WBC Count: 4.3 10*3/uL (ref 4.0–10.5)
nRBC: 0 % (ref 0.0–0.2)

## 2022-12-15 LAB — CMP (CANCER CENTER ONLY)
ALT: 15 U/L (ref 0–44)
AST: 15 U/L (ref 15–41)
Albumin: 4 g/dL (ref 3.5–5.0)
Alkaline Phosphatase: 55 U/L (ref 38–126)
Anion gap: 8 (ref 5–15)
BUN: 17 mg/dL (ref 8–23)
CO2: 24 mmol/L (ref 22–32)
Calcium: 9.9 mg/dL (ref 8.9–10.3)
Chloride: 102 mmol/L (ref 98–111)
Creatinine: 1.05 mg/dL (ref 0.61–1.24)
GFR, Estimated: >60 mL/min (ref >60)
Glucose, Bld: 174 mg/dL — ABNORMAL HIGH (ref 70–99)
Potassium: 4.1 mmol/L (ref 3.5–5.1)
Sodium: 134 mmol/L — ABNORMAL LOW (ref 135–145)
Total Bilirubin: 0.3 mg/dL (ref 0.3–1.2)
Total Protein: 6.7 g/dL (ref 6.5–8.1)

## 2022-12-15 LAB — TSH: TSH: 5.15 u[IU]/mL — ABNORMAL HIGH (ref 0.350–4.500)

## 2022-12-15 MED ORDER — SODIUM CHLORIDE 0.9 % IV SOLN
480.0000 mg | Freq: Once | INTRAVENOUS | Status: AC
  Administered 2022-12-15: 480 mg via INTRAVENOUS
  Filled 2022-12-15: qty 48

## 2022-12-15 MED ORDER — SODIUM CHLORIDE 0.9% FLUSH
10.0000 mL | Freq: Once | INTRAVENOUS | Status: AC
  Administered 2022-12-15: 10 mL

## 2022-12-15 MED ORDER — SODIUM CHLORIDE 0.9 % IV SOLN: Freq: Once | INTRAVENOUS | Status: AC

## 2022-12-15 MED ORDER — HEPARIN SOD (PORK) LOCK FLUSH 100 UNIT/ML IV SOLN
500.0000 [IU] | Freq: Once | INTRAVENOUS | Status: AC | PRN
  Administered 2022-12-15: 500 [IU]

## 2022-12-15 MED ORDER — SODIUM CHLORIDE 0.9% FLUSH
10.0000 mL | INTRAVENOUS | Status: DC | PRN
  Administered 2022-12-15: 10 mL

## 2022-12-15 NOTE — Patient Instructions (Signed)
[facility]COL[facility]  Discharge Instructions: Thank you for choosing [facility] to provide your oncology and hematology care.   If you have a lab appointment with the [facility], please go directly to the [facility] and check in at the registration area.   Wear comfortable clothing and clothing appropriate for easy access to any Portacath or PICC line.   We strive to give you quality time with your provider. You may need to reschedule your appointment if you arrive late (15 or more minutes).  Arriving late affects you and other patients whose appointments are after yours.  Also, if you miss three or more appointments without notifying the office, you may be dismissed from the clinic at the provider's discretion.      For prescription refill requests, have your pharmacy contact our office and allow 72 hours for refills to be completed.    Today you received the following chemotherapy and/or immunotherapy agents opdivo      To help prevent nausea and vomiting after your treatment, we encourage you to take your nausea medication as directed.  BELOW ARE SYMPTOMS THAT SHOULD BE REPORTED IMMEDIATELY: *FEVER GREATER THAN 100.4 F (38 C) OR HIGHER *CHILLS OR SWEATING *NAUSEA AND VOMITING THAT IS NOT CONTROLLED WITH YOUR NAUSEA MEDICATION *UNUSUAL SHORTNESS OF BREATH *UNUSUAL BRUISING OR BLEEDING *URINARY PROBLEMS (pain or burning when urinating, or frequent urination) *BOWEL PROBLEMS (unusual diarrhea, constipation, pain near the anus) TENDERNESS IN MOUTH AND THROAT WITH OR WITHOUT PRESENCE OF ULCERS (sore throat, sores in mouth, or a toothache) UNUSUAL RASH, SWELLING OR PAIN  UNUSUAL VAGINAL DISCHARGE OR ITCHING   Items with * indicate a potential emergency and should be followed up as soon as possible or go to the Emergency Department if any problems should occur.  Please show the CHEMOTHERAPY ALERT CARD or IMMUNOTHERAPY ALERT CARD at check-in to the  Emergency Department and triage nurse.  Should you have questions after your visit or need to cancel or reschedule your appointment, please contact [facility]  Dept: [phone]  and follow the prompts.  Office hours are 8:00 a.m. to 4:30 p.m. Monday - Friday. Please note that voicemails left after 4:00 p.m. may not be returned until the following business day.  We are closed weekends and major holidays. You have access to a nurse at all times for urgent questions. Please call the main number to the clinic Dept: [phone] and follow the prompts.   For any non-urgent questions, you may also contact your provider using MyChart. We now offer e-Visits for anyone 107 and older to request care online for non-urgent symptoms. For details visit [email or web address].   Also download the MyChart app! Go to the app store, search "MyChart", open the app, select [facility], and log in with your MyChart username and password.

## 2022-12-16 ENCOUNTER — Other Ambulatory Visit: Payer: Self-pay

## 2022-12-16 LAB — T4: T4, Total: 6.7 ug/dL (ref 4.5–12.0)

## 2022-12-17 ENCOUNTER — Other Ambulatory Visit: Payer: Self-pay

## 2022-12-28 ENCOUNTER — Other Ambulatory Visit: Payer: Self-pay

## 2022-12-30 ENCOUNTER — Encounter: Payer: Self-pay | Admitting: Internal Medicine

## 2022-12-30 ENCOUNTER — Encounter: Payer: Self-pay | Admitting: Physician Assistant

## 2023-01-10 ENCOUNTER — Ambulatory Visit (HOSPITAL_COMMUNITY)
Admission: RE | Admit: 2023-01-10 | Discharge: 2023-01-10 | Disposition: A | Discharge status: Home / Self Care | Payer: Medicare Other | Source: Ambulatory Visit | Attending: Physician Assistant | Admitting: Physician Assistant

## 2023-01-10 DIAGNOSIS — C7801 Secondary malignant neoplasm of right lung: Secondary | ICD-10-CM | POA: Insufficient documentation

## 2023-01-10 DIAGNOSIS — I2510 Atherosclerotic heart disease of native coronary artery without angina pectoris: Secondary | ICD-10-CM | POA: Insufficient documentation

## 2023-01-10 DIAGNOSIS — C7800 Secondary malignant neoplasm of unspecified lung: Secondary | ICD-10-CM | POA: Diagnosis present

## 2023-01-10 DIAGNOSIS — J439 Emphysema, unspecified: Secondary | ICD-10-CM | POA: Diagnosis not present

## 2023-01-10 DIAGNOSIS — N62 Hypertrophy of breast: Secondary | ICD-10-CM | POA: Insufficient documentation

## 2023-01-10 DIAGNOSIS — I700 Atherosclerosis of aorta: Secondary | ICD-10-CM | POA: Diagnosis not present

## 2023-01-10 MED ORDER — IOHEXOL 300 MG/ML  SOLN
100.0000 mL | Freq: Once | INTRAMUSCULAR | Status: AC | PRN
  Administered 2023-01-10: 100 mL via INTRAVENOUS

## 2023-01-11 ENCOUNTER — Telehealth: Payer: Self-pay | Admitting: Internal Medicine

## 2023-01-11 NOTE — Telephone Encounter (Signed)
Rescheduled 02/28 appointment due to provider pal, called patient and left a voicemail regarding rescheduled appointment.

## 2023-01-12 ENCOUNTER — Inpatient Hospital Stay (HOSPITAL_BASED_OUTPATIENT_CLINIC_OR_DEPARTMENT_OTHER): Payer: Medicare Other | Admitting: Internal Medicine

## 2023-01-12 ENCOUNTER — Inpatient Hospital Stay: Payer: Medicare Other

## 2023-01-12 ENCOUNTER — Ambulatory Visit: Payer: Medicare Other

## 2023-01-12 ENCOUNTER — Other Ambulatory Visit: Payer: Medicare Other

## 2023-01-12 DIAGNOSIS — C7800 Secondary malignant neoplasm of unspecified lung: Secondary | ICD-10-CM

## 2023-01-12 DIAGNOSIS — Z95828 Presence of other vascular implants and grafts: Secondary | ICD-10-CM

## 2023-01-12 DIAGNOSIS — Z5112 Encounter for antineoplastic immunotherapy: Secondary | ICD-10-CM | POA: Diagnosis not present

## 2023-01-12 LAB — CBC WITH DIFFERENTIAL (CANCER CENTER ONLY)
Abs Immature Granulocytes: 0.02 10*3/uL (ref 0.00–0.07)
Basophils Absolute: 0 10*3/uL (ref 0.0–0.1)
Basophils Relative: 1 %
Eosinophils Absolute: 0.2 10*3/uL (ref 0.0–0.5)
Eosinophils Relative: 6 %
HCT: 27.7 % — ABNORMAL LOW (ref 39.0–52.0)
Hemoglobin: 9.6 g/dL — ABNORMAL LOW (ref 13.0–17.0)
Immature Granulocytes: 1 %
Lymphocytes Relative: 26 %
Lymphs Abs: 1.1 10*3/uL (ref 0.7–4.0)
MCH: 33.7 pg (ref 26.0–34.0)
MCHC: 34.7 g/dL (ref 30.0–36.0)
MCV: 97.2 fL (ref 80.0–100.0)
Monocytes Absolute: 0.6 10*3/uL (ref 0.1–1.0)
Monocytes Relative: 15 %
Neutro Abs: 2.2 10*3/uL (ref 1.7–7.7)
Neutrophils Relative %: 51 %
Platelet Count: 202 10*3/uL (ref 150–400)
RBC: 2.85 MIL/uL — ABNORMAL LOW (ref 4.22–5.81)
RDW: 11.8 % (ref 11.5–15.5)
WBC Count: 4.1 10*3/uL (ref 4.0–10.5)
nRBC: 0 % (ref 0.0–0.2)

## 2023-01-12 LAB — CMP (CANCER CENTER ONLY)
ALT: 16 U/L (ref 0–44)
AST: 16 U/L (ref 15–41)
Albumin: 3.8 g/dL (ref 3.5–5.0)
Alkaline Phosphatase: 50 U/L (ref 38–126)
Anion gap: 8 (ref 5–15)
BUN: 14 mg/dL (ref 8–23)
CO2: 25 mmol/L (ref 22–32)
Calcium: 9.4 mg/dL (ref 8.9–10.3)
Chloride: 104 mmol/L (ref 98–111)
Creatinine: 1.09 mg/dL (ref 0.61–1.24)
GFR, Estimated: >60 mL/min (ref >60)
Glucose, Bld: 145 mg/dL — ABNORMAL HIGH (ref 70–99)
Potassium: 4.3 mmol/L (ref 3.5–5.1)
Sodium: 137 mmol/L (ref 135–145)
Total Bilirubin: 0.4 mg/dL (ref 0.3–1.2)
Total Protein: 7.1 g/dL (ref 6.5–8.1)

## 2023-01-12 LAB — TSH: TSH: 3.665 u[IU]/mL (ref 0.350–4.500)

## 2023-01-12 MED ORDER — SODIUM CHLORIDE 0.9 % IV SOLN
480.0000 mg | Freq: Once | INTRAVENOUS | Status: AC
  Administered 2023-01-12: 480 mg via INTRAVENOUS
  Filled 2023-01-12: qty 48

## 2023-01-12 MED ORDER — SODIUM CHLORIDE 0.9 % IV SOLN: Freq: Once | INTRAVENOUS | Status: AC

## 2023-01-12 MED ORDER — SODIUM CHLORIDE 0.9% FLUSH
10.0000 mL | Freq: Once | INTRAVENOUS | Status: AC
  Administered 2023-01-12: 10 mL

## 2023-01-12 MED ORDER — HEPARIN SOD (PORK) LOCK FLUSH 100 UNIT/ML IV SOLN
500.0000 [IU] | Freq: Once | INTRAVENOUS | Status: AC | PRN
  Administered 2023-01-12: 500 [IU]

## 2023-01-12 MED ORDER — SODIUM CHLORIDE 0.9% FLUSH
10.0000 mL | INTRAVENOUS | Status: DC | PRN
  Administered 2023-01-12: 10 mL

## 2023-01-12 NOTE — Progress Notes (Signed)
[facility] [phone]   [phone]  OFFICE PROGRESS NOTE  [name], [name], MD [place] [address]  DIAGNOSIS: Stage IV (T3, N3, M1) metastatic melanoma.  He presented with large right middle lobe lung mass in addition to large right hilar/suprahilar mass and adenopathy as well as postobstructive consolidation and left hilar adenopathy as well as small right pleural effusion.   Molecular Studies:  PD-L1 expression 100%. PTEN deletion negative, TERT negative, BRAF wild type.   PRIOR THERAPY:  palliative radiation to the large obstructive mass under the care of Dr. Tammi Klippel. Last dose on 12/16/21.    CURRENT THERAPY: immunotherapy with nivolumab 1 Mg/KG and ipilimumab 3 Mg/KG IV every 3 weeks.  First dose expected on 12/17/2021.  Status post 15 cycles.  Starting from cycle #5 the patient is on maintenance treatment with single agent nivolumab every 4 weeks.  INTERVAL HISTORY: [name] [name] 77 y.o. male returns to the clinic today for follow-up visit accompanied by his wife.  The patient is feeling fine today with no concerning complaints except for the mild skin rash from the immunotherapy.  He has a black spot on the left heel and he is scheduled to see Dr. Sharol Given in few days.  He is currently using Silvadene.  He denied having any chest pain, shortness of breath, cough or hemoptysis.  He has no nausea, vomiting, diarrhea or constipation.  He has no headache or visual changes.  He has no weight loss or night sweats.  He is here today for evaluation with repeat CT scan of the chest, abdomen and pelvis for restaging of his disease.   MEDICAL HISTORY: Past Medical History:  Diagnosis Date   Anemia    CHF (congestive heart failure) (HCC)    Complication of anesthesia    slow to awaken  x1   Dyspnea    Family hx of prostate cancer    IN BROTHER   History of echocardiogram    Echo 5/18: EF 60-63, normal diastolic function, PASP 32    History of nuclear stress test    Myoview 5/18: EF 60, inf defect suspicious for artifact, no ischemia, Low Risk   Hx of basal cell carcinoma    FOLLOWED BY DERMATOLOGIST DR. GOODRICH   Hyperlipidemia    PAF (paroxysmal atrial fibrillation) ([place])    DIAGNOSED 5/18   Pneumonia    Type 2 diabetes mellitus (HCC)     ALLERGIES:  has No Known Allergies.  MEDICATIONS:  Current Outpatient Medications  Medication Sig Dispense Refill   ACCU-CHEK SOFTCLIX LANCETS lancets by Other route. Use as instructed     acetaminophen (TYLENOL) 500 MG tablet Take 1,000 mg by mouth every 6 (six) hours as needed for moderate pain.     apixaban (ELIQUIS) 5 MG TABS tablet Take 1 tablet (5 mg total) by mouth 2 (two) times daily. 180 tablet 3   Blood Glucose Monitoring Suppl (ACCU-CHEK AVIVA PLUS) w/Device KIT by Does not apply route.     Cholecalciferol (VITAMIN D3) 50 MCG (2000 UT) capsule Take 2,000 Units by mouth daily.     Cyanocobalamin (B-12) 2500 MCG TABS Take 2,500 mcg by mouth once a week.     dextromethorphan (DELSYM) 30 MG/5ML liquid Take 30 mg by mouth 2 (two) times daily as needed for cough.     dextromethorphan-guaiFENesin (MUCINEX DM) 30-600 MG 12hr tablet Take 1 tablet by mouth 2 (two) times daily as needed (congestion).  DM-APAP-CPM (CORICIDIN HBP PO) Take 1 tablet by mouth daily as needed (congestion).     docusate sodium (COLACE) 100 MG capsule Take 100 mg by mouth daily as needed for mild constipation.     ferrous sulfate 325 (65 FE) MG tablet Take 325 mg by mouth daily with breakfast.     glipiZIDE (GLUCOTROL) 5 MG tablet Take 5 mg by mouth daily. Not started medication yet-     glucose blood test strip 1 each by Other route as needed for other. Use as instructed     hydrocortisone 2.5 % cream Apply topically 2 (two) times daily. 453.6 g 0   lidocaine-prilocaine (EMLA) cream Apply 1 application. topically as needed. Apply 1-2 tsp on the skin over the port site. Apply the cream 1-2 hours  prior to treatment. Cover with plastic wrap. 30 g 0   metFORMIN (GLUCOPHAGE) 500 MG tablet Take 1,000 mg by mouth 2 (two) times daily.     metoprolol succinate (TOPROL XL) 25 MG 24 hr tablet Take 1 tablet (25 mg total) by mouth daily. 90 tablet 3   prochlorperazine (COMPAZINE) 10 MG tablet 1 tablet as needed Orally every 6 hours     simvastatin (ZOCOR) 40 MG tablet Take 40 mg by mouth daily at 6 PM.     triamcinolone cream (KENALOG) 0.1 % Apply 1 application. topically.     zinc gluconate 50 MG tablet Take 50 mg by mouth daily.     No current facility-administered medications for this visit.   Facility-Administered Medications Ordered in Other Visits  Medication Dose Route Frequency Provider Last Rate Last Admin   regadenoson (LEXISCAN) injection SOLN 0.4 mg  0.4 mg Intravenous Once Hilty, Nadean Corwin, MD        SURGICAL HISTORY:  Past Surgical History:  Procedure Laterality Date   [place]   DR. DAVIS   BASAL CELL CARCINOMA EXCISION     OUTPATIENT   BRONCHIAL BIOPSY  11/09/2021   Procedure: BRONCHIAL BIOPSIES;  Surgeon: Collene Gobble, MD;  Location: [facility];  Service: Pulmonary;;   BRONCHIAL BIOPSY  11/23/2021   Procedure: BRONCHIAL BIOPSIES;  Surgeon: Collene Gobble, MD;  Location[facility] END[facility]COPY;  Service: Pulmonary;;   BRONCHIAL BRUSHINGS  11/09/2021   Procedure: BRONCHIAL BRUSHINGS;  Surgeon: Collene Gobble, MD;  Location: [facility] ENDOSCOPY;  Service: Pulmonary;;   BRONCHIAL BRUSHINGS  11/23/2021   Procedure: BRONCHIAL BRUSHINGS;  Surgeon: Collene Gobble, MD;  Location: [facility];  Service: Pulmonary;;   BRONCHIAL NEEDLE ASPIRATION BIOPSY  11/09/2021   Procedure: BRONCHIAL NEEDLE ASPIRATION BIOPSIES;  Surgeon: Collene Gobble, MD;  Location: [facility] ENDOSCOPY;  Service: Pulmonary;;   BRONCHIAL NEEDLE ASPIRATION BIOPSY  11/23/2021   Procedure: BRONCHIAL NEEDLE ASPIRATION BIOPSIES;  Surgeon: Collene Gobble, MD;  Location: [facility];  Service: Pulmonary;;    COLONOSCOPY  02/20/2003, 04/24/14   IR IMAGING GUIDED PORT INSERTION  03/22/2022   SKIN GRAFT     ON THE RIGHT HAND AFTER A BURN IN THE DISTANT PAST   VASECTOMY     OUTPATIENT   VIDEO BRONCHOSCOPY WITH ENDOBRONCHIAL ULTRASOUND Bilateral 11/09/2021   Procedure: VIDEO BRONCHOSCOPY WITH ENDOBRONCHIAL ULTRASOUND;  Surgeon: Collene Gobble, MD;  Location: [facility] ENDOSCOPY;  Service: Pulmonary;  Laterality: Bilateral;   VIDEO BRONCHOSCOPY WITH ENDOBRONCHIAL ULTRASOUND N/A 11/23/2021   Procedure: VIDEO BRONCHOSCOPY WITH ENDOBRONCHIAL ULTRASOUND;  Surgeon: Collene Gobble, MD;  Location: [facility] ENDOSCOPY;  Service: Pulmonary;  Laterality: N/A;   VIDEO BRONCHOSCOPY WITH RADIAL  ENDOBRONCHIAL ULTRASOUND  11/09/2021   Procedure: VIDEO BRONCHOSCOPY WITH RADIAL ENDOBRONCHIAL ULTRASOUND;  Surgeon: Collene Gobble, MD;  Location: [facility] ENDOSCOPY;  Service: Pulmonary;;    REVIEW OF SYSTEMS:  Constitutional: negative Eyes: negative Ears, nose, mouth, throat, and face: negative Respiratory: negative Cardiovascular: negative Gastrointestinal: negative Genitourinary:negative Integument/breast: positive for rash Hematologic/lymphatic: negative Musculoskeletal:negative Neurological: negative Behavioral/Psych: negative Endocrine: negative Allergic/Immunologic: negative   PHYSICAL EXAMINATION: General appearance: alert, cooperative, and no distress Head: Normocephalic, without obvious abnormality, atraumatic Neck: no adenopathy, no JVD, supple, symmetrical, trachea midline, and thyroid not enlarged, symmetric, no tenderness/mass/nodules Lymph nodes: Cervical, supraclavicular, and axillary nodes normal. Resp: clear to auscultation bilaterally Back: symmetric, no curvature. ROM normal. No CVA tenderness. Cardio: regular rate and rhythm, S1, S2 normal, no murmur, click, rub or gallop GI: soft, non-tender; bowel sounds normal; no masses,  no organomegaly Extremities: extremities normal, atraumatic, no cyanosis or  edema Neurologic: Alert and oriented X 3, normal strength and tone. Normal symmetric reflexes. Normal coordination and gait  ECOG PERFORMANCE STATUS: 1 - Symptomatic but completely ambulatory  Blood pressure 130/79, pulse 76, temperature 97.9 F (36.6 C), temperature source Temporal, resp. rate 16, height 5\' 11"  (1.803 m), weight 165 lb (74.8 kg), SpO2 98 %.  LABORATORY DATA: Lab Results  Component Value Date   WBC 4.1 01/12/2023   HGB 9.6 (L) 01/12/2023   HCT 27.7 (L) 01/12/2023   MCV 97.2 01/12/2023   PLT 202 01/12/2023      Chemistry      Component Value Date/Time   NA 134 (L) 12/15/2022 0957   NA 135 02/23/2022 1429   K 4.1 12/15/2022 0957   CL 102 12/15/2022 0957   CO2 24 12/15/2022 0957   BUN 17 12/15/2022 0957   BUN 23 02/23/2022 1429   CREATININE 1.05 12/15/2022 0957      Component Value Date/Time   CALCIUM 9.9 12/15/2022 0957   ALKPHOS 55 12/15/2022 0957   AST 15 12/15/2022 0957   ALT 15 12/15/2022 0957   BILITOT 0.3 12/15/2022 0957       RADIOGRAPHIC STUDIES: CT CHEST ABDOMEN PELVIS W CONTRAST  Result Date: 01/11/2023 CLINICAL DATA:  Malignant melanoma with lung metastasis. * Tracking Code: BO * EXAM: CT CHEST, ABDOMEN, AND PELVIS WITH CONTRAST TECHNIQUE: Multidetector CT imaging of the chest, abdomen and pelvis was performed following the standard protocol during bolus administration of intravenous contrast. RADIATION DOSE REDUCTION: This exam was performed according to the departmental dose-optimization program which includes automated exposure control, adjustment of the mA and/or kV according to patient size and/or use of iterative reconstruction technique. CONTRAST:  132mL OMNIPAQUE IOHEXOL 300 MG/ML  SOLN COMPARISON:  10/19/2022 FINDINGS: CT CHEST FINDINGS Cardiovascular: Right Port-A-Cath tip superior caval/atrial junction. Aortic atherosclerosis. Normal heart size, without pericardial effusion. Three vessel coronary artery calcification. No central  pulmonary embolism, on this non-dedicated study. Mediastinum/Nodes: No supraclavicular adenopathy. No mediastinal adenopathy. Anteromedial right hilar soft tissue thickening is similar at 1.8 x 0.9 cm on 24/2 (when remeasured). Lungs/Pleura: No pleural fluid.  Mild centrilobular emphysema. Presumably treatment related interstitial thickening within the right suprahilar and perihilar lung again identified. Index anterior right upper lobe pulmonary nodule measures 2.3 x 1.8 cm on 54/6 versus 2.6 x 1.9 cm when remeasured in a similar fashion on the prior. Index right middle lobe lung mass measures 3.2 by 2.0 cm on 85/6 versus 3.3 x 2.0 cm on the prior exam, suggesting stability. Anterolateral right upper lobe pulmonary nodule of 9 mm on 34/6 is similar  to 10 mm on the prior. Again identified are tiny left lower lobe pulmonary nodules including on 126/6, similar. Biapical pleuroparenchymal scarring. Musculoskeletal: Mild bilateral gynecomastia. No acute osseous abnormality. CT ABDOMEN PELVIS FINDINGS Hepatobiliary: Normal liver. Normal gallbladder, without biliary ductal dilatation. Pancreas: Normal, without mass or ductal dilatation. Spleen: Normal in size, without focal abnormality. Adrenals/Urinary Tract: Normal adrenal glands. Normal kidneys, without hydronephrosis. Normal urinary bladder. Stomach/Bowel: Normal stomach, without wall thickening. Scattered colonic diverticula. Underdistended ascending colon. Normal terminal ileum and appendix. Normal small bowel. Vascular/Lymphatic: Aortic atherosclerosis. No abdominopelvic adenopathy. Reproductive: Normal prostate. Other: No significant free fluid. No evidence of omental or peritoneal disease. Musculoskeletal: Degenerative disc disease at L4-5 and L5-S1. IMPRESSION: 1. Similar and minimal decrease in size of right-sided pulmonary metastasis. 2. Similar treated tumor within the  anterior right hilum. 3. No new or progressive disease. 4. No acute process or evidence of  metastatic disease in the abdomen or pelvis. 5. Aortic atherosclerosis (ICD10-I70.0), coronary artery atherosclerosis and emphysema (ICD10-J43.9). 6. Mild bilateral gynecomastia. Electronically Signed   By: Abigail Miyamoto M.D.   On: 01/11/2023 08:44    ASSESSMENT AND PLAN: This is a very pleasant 77 years old white male recently diagnosed with stage IV metastatic malignant melanoma presented with large right middle lobe lung mass in addition to large right hilar/supra hilar mass and adenopathy as well as postobstructive consolidation, left hilar adenopathy as well as small right pleural effusion diagnosed in December 2022. The patient is status post a course of palliative radiotherapy to the obstructive lesion under the care of Dr. Tammi Klippel. He is currently undergoing treatment with immunotherapy with ipilimumab 3 Mg/KG and nivolumab 1 Mg/KG every 3 weeks status post 15 cycles.  Starting from cycle #5 the patient is on maintenance treatment with single agent nivolumab 480 Mg IV every 4 weeks. The patient has been tolerating this treatment fairly well with no concerning adverse effects. He had repeat CT scan of the chest, abdomen and pelvis performed recently.  I personally and independently reviewed the scan and discussed the result with the patient and his wife. His scan showed no concerning findings for disease progression. I recommended for him to continue his current treatment with maintenance nivolumab every 4 weeks. For the anemia, he will continue on the oral iron tablets. For the skin breaks on the left heel, he is scheduled to see Dr. Sharol Given in few days for evaluation of this condition. The patient will come back for follow-up visit in 4 weeks before starting the next cycle of his treatment. He was advised to call immediately if he has any other concerning symptoms in the interval. The patient voices understanding of current disease status and treatment options and is in agreement with the current  care plan.  All questions were answered. The patient knows to call the clinic with any problems, questions or concerns. We can certainly see the patient much sooner if necessary.  Disclaimer: This note was dictated with voice recognition software. Similar sounding words can inadvertently be transcribed and may not be corrected upon review.

## 2023-01-12 NOTE — Patient Instructions (Signed)
[facility]  Discharge Instructions: Thank you for choosing [facility] to provide your oncology and hematology care.   If you have a lab appointment with the [facility], please go directly to the [facility] and check in at the registration area.   Wear comfortable clothing and clothing appropriate for easy access to any Portacath or PICC line.   We strive to give you quality time with your provider. You may need to reschedule your appointment if you arrive late (15 or more minutes).  Arriving late affects you and other patients whose appointments are after yours.  Also, if you miss three or more appointments without notifying the office, you may be dismissed from the clinic at the provider's discretion.      For prescription refill requests, have your pharmacy contact our office and allow 72 hours for refills to be completed.    Today you received the following chemotherapy and/or immunotherapy agents: nivolumab      To help prevent nausea and vomiting after your treatment, we encourage you to take your nausea medication as directed.  BELOW ARE SYMPTOMS THAT SHOULD BE REPORTED IMMEDIATELY: *FEVER GREATER THAN 100.4 F (38 C) OR HIGHER *CHILLS OR SWEATING *NAUSEA AND VOMITING THAT IS NOT CONTROLLED WITH YOUR NAUSEA MEDICATION *UNUSUAL SHORTNESS OF BREATH *UNUSUAL BRUISING OR BLEEDING *URINARY PROBLEMS (pain or burning when urinating, or frequent urination) *BOWEL PROBLEMS (unusual diarrhea, constipation, pain near the anus) TENDERNESS IN MOUTH AND THROAT WITH OR WITHOUT PRESENCE OF ULCERS (sore throat, sores in mouth, or a toothache) UNUSUAL RASH, SWELLING OR PAIN  UNUSUAL VAGINAL DISCHARGE OR ITCHING   Items with * indicate a potential emergency and should be followed up as soon as possible or go to the Emergency Department if any problems should occur.  Please show the CHEMOTHERAPY ALERT CARD or IMMUNOTHERAPY ALERT CARD at  check-in to the Emergency Department and triage nurse.  Should you have questions after your visit or need to cancel or reschedule your appointment, please contact [facility]  Dept: [phone]  and follow the prompts.  Office hours are 8:00 a.m. to 4:30 p.m. Monday - Friday. Please note that voicemails left after 4:00 p.m. may not be returned until the following business day.  We are closed weekends and major holidays. You have access to a nurse at all times for urgent questions. Please call the main number to the clinic Dept: [phone] and follow the prompts.   For any non-urgent questions, you may also contact your provider using MyChart. We now offer e-Visits for anyone 33 and older to request care online for non-urgent symptoms. For details visit [email or web address].   Also download the MyChart app! Go to the app store, search "MyChart", open the app, select [facility], and log in with your MyChart username and password.

## 2023-01-13 LAB — T4: T4, Total: 6.7 ug/dL (ref 4.5–12.0)

## 2023-01-17 ENCOUNTER — Ambulatory Visit (INDEPENDENT_AMBULATORY_CARE_PROVIDER_SITE_OTHER): Payer: Medicare Other | Admitting: Orthopedic Surgery

## 2023-01-17 ENCOUNTER — Encounter: Payer: Self-pay | Admitting: Orthopedic Surgery

## 2023-01-17 DIAGNOSIS — S90852A Superficial foreign body, left foot, initial encounter: Secondary | ICD-10-CM | POA: Diagnosis not present

## 2023-01-17 NOTE — Progress Notes (Signed)
Office Visit Note   Patient: [name] [name]           Date of Birth: Oct 18, 1946           MRN: [id number] Visit Date: 01/17/2023              Requested by: Haywood Pao, MD [address] [place],  [place] [address] PCP: Osborne Casco, [name], MD  Chief Complaint  Patient presents with   Left Foot - Wound Check      HPI: Patient is a 77 year old gentleman who is seen for initial evaluation for ulcer plantar aspect left heel.  Patient states that it is black and painful.  He states it started about 2 weeks ago when he was walking barefoot in [place].  Assessment & Plan: Visit Diagnoses:  1. Foreign body in left foot, initial encounter     Plan: Ulcer was debrided this appears to be a foreign body.  Patient will follow-up as needed.  Follow-Up Instructions: Return if symptoms worsen or fail to improve.   Ortho Exam  Patient is alert, oriented, no adenopathy, well-dressed, normal affect, normal respiratory effort. Examination patient has a palpable dorsalis pedis pulse bilaterally.  There are no ulcers in the right foot.  Examination of the left foot he has a black hard callus on the left heel plantar aspect.  There is no cellulitis.  After informed consent a 10 blade knife was used to debride the skin and soft tissue.  A black foreign body was removed.  The tissue deep was healthy and viable.  No clinical signs of a wart or hyperkeratotic lesion.  Imaging: No results found. No images are attached to the encounter.  Labs: Lab Results  Component Value Date   HGBA1C 7.3 (H) 01/15/2022   REPTSTATUS 01/20/2022 FINAL 01/15/2022   REPTSTATUS 01/20/2022 FINAL 01/15/2022   CULT  01/15/2022    NO GROWTH 5 DAYS Performed at [facility], [place] [address], [place], [place] [address]    CULT  01/15/2022    NO GROWTH 5 DAYS Performed at [facility] [address] [place], [place] [address]      Lab Results  Component Value Date   ALBUMIN 3.8 01/12/2023   ALBUMIN  4.0 12/15/2022   ALBUMIN 3.8 11/17/2022    Lab Results  Component Value Date   MG 1.8 01/18/2022   MG 1.7 01/17/2022   MG 1.5 (L) 01/16/2022   No results found for: "VD25OH"  No results found for: "PREALBUMIN"    Latest Ref Rng & Units 01/12/2023    8:30 AM 12/15/2022    9:57 AM 11/17/2022   10:02 AM  CBC EXTENDED  WBC 4.0 - 10.5 K/uL 4.1  4.3  4.3   RBC 4.22 - 5.81 MIL/uL 2.85  2.98  2.79   Hemoglobin 13.0 - 17.0 g/dL 9.6  10.0  9.4   HCT 39.0 - 52.0 % 27.7  29.2  27.6   Platelets 150 - 400 K/uL 202  209  194   NEUT# 1.7 - 7.7 K/uL 2.2  2.5  2.7   Lymph# 0.7 - 4.0 K/uL 1.1  1.0  1.0      There is no height or weight on file to calculate BMI.  Orders:  No orders of the defined types were placed in this encounter.  No orders of the defined types were placed in this encounter.    Procedures: No procedures performed  Clinical Data: No additional findings.  ROS:  All other systems negative, except as noted in the HPI. Review of Systems  Objective: Vital Signs: There were no vitals taken for this visit.  Specialty Comments:  No specialty comments available.  PMFS History: Patient Active Problem List   Diagnosis Date Noted   Port-A-Cath in place 05/05/2022   Hypercalcemia of malignancy 01/27/2022   Pressure injury of skin 01/17/2022   Acute respiratory failure with hypoxia (HCC) 01/16/2022   CAP (community acquired pneumonia) 01/15/2022   Hyponatremia 01/15/2022   Rash 12/28/2021   Decreased appetite 12/28/2021   Encounter for antineoplastic immunotherapy 12/17/2021   Malignant melanoma metastatic to lung ([place]) 11/30/2021   Localized malignant neoplasm of right lung ([facility]) 11/12/2021   Hilar adenopathy 11/09/2021   Mass of middle lobe of right lung 10/30/2021   Type 2 diabetes mellitus ([place]) 04/29/2017   Paroxysmal atrial fibrillation with RVR ([facility])    Past Medical History:  Diagnosis Date   Anemia    CHF (congestive heart failure) (HCC)     Complication of anesthesia    slow to awaken  x1   Dyspnea    Family hx of prostate cancer    IN BROTHER   History of echocardiogram    Echo 5/18: EF 16-10, normal diastolic function, PASP 32   History of nuclear stress test    Myoview 5/18: EF 60, inf defect suspicious for artifact, no ischemia, Low Risk   Hx of basal cell carcinoma    FOLLOWED BY DERMATOLOGIST DR. GOODRICH   Hyperlipidemia    PAF (paroxysmal atrial fibrillation) ([place] [place])    DIAGNOSED 5/18   Pneumonia    Type 2 diabetes mellitus ([place] [facility])     Family History  Problem Relation Age of Onset   Diabetes Mother    Heart attack Mother 67   Stroke Father    CVA Father    Heart attack Father 64   Hyperlipidemia Brother    Prostate cancer Brother    Kidney disease Son        STAGE 4   Diabetes Son     Past Surgical History:  Procedure Laterality Date   [place]   DR. DAVIS   BASAL CELL CARCINOMA EXCISION     OUTPATIENT   BRONCHIAL BIOPSY  11/09/2021   Procedure: BRONCHIAL BIOPSIES;  Surgeon: Collene Gobble, MD;  Location: [facility];  Service: Pulmonary;;   BRONCHIAL BIOPSY  11/23/2021   Procedure: BRONCHIAL BIOPSIES;  Surgeon: Collene Gobble, MD;  Location: [facility] ENDOSCOPY;  Service: Pulmonary;;   BRONCHIAL BRUSHINGS  11/09/2021   Procedure: BRONCHIAL BRUSHINGS;  Surgeon: Collene Gobble, MD;  Location: [facility] ENDOSCOPY;  Service: Pulmonary;;   BRONCHIAL BRUSHINGS  11/23/2021   Procedure: BRONCHIAL BRUSHINGS;  Surgeon: Collene Gobble, MD;  Location: [facility]COPY;  Service: Pulmonary;;   BRONCHIAL NEEDLE ASPIRATION BIOPSY  11/09/2021   Procedure: BRONCHIAL NEEDLE ASPIRATION BIOPSIES;  Surgeon: Collene Gobble, MD;  Location: [facility] ENDOSCOPY;  Service: Pulmonary;;   BRONCHIAL NEEDLE ASPIRATION BIOPSY  11/23/2021   Procedure: BRONCHIAL NEEDLE ASPIRATION BIOPSIES;  Surgeon: Collene Gobble, MD;  Location: [facility];  Service: Pulmonary;;   COLONOSCOPY  02/20/2003, 04/24/14   IR IMAGING GUIDED PORT  INSERTION  03/22/2022   SKIN GRAFT     ON THE RIGHT HAND AFTER A BURN IN THE DISTANT PAST   VASECTOMY     OUTPATIENT   VIDEO BRONCHOSCOPY WITH ENDOBRONCHIAL ULTRASOUND Bilateral 11/09/2021   Procedure: VIDEO BRONCHOSCOPY WITH ENDOBRONCHIAL ULTRASOUND;  Surgeon: Baltazar Apo  S, MD;  Location: [facility];  Service: Pulmonary;  Laterality: Bilateral;   VIDEO BRONCHOSCOPY WITH ENDOBRONCHIAL ULTRASOUND N/A 11/23/2021   Procedure: VIDEO BRONCHOSCOPY WITH ENDOBRONCHIAL ULTRASOUND;  Surgeon: Collene Gobble, MD;  Location: [facility] ENDOSCOPY;  Service: Pulmonary;  Laterality: N/A;   VIDEO BRONCHOSCOPY WITH RADIAL ENDOBRONCHIAL ULTRASOUND  11/09/2021   Procedure: VIDEO BRONCHOSCOPY WITH RADIAL ENDOBRONCHIAL ULTRASOUND;  Surgeon: Collene Gobble, MD;  Location: [facility] ENDOSCOPY;  Service: Pulmonary;;   Social History   Occupational History   Occupation: RETIRED FROM [employer]  Tobacco Use   Smoking status: Former    Years: 8.00    Types: Cigarettes    Quit date: 04/28/1970    Years since quitting: 52.7   Smokeless tobacco: Never  Vaping Use   Vaping Use: Never used  Substance and Sexual Activity   Alcohol use: Not Currently    Alcohol/week: 17.0 standard drinks of alcohol    Types: 14 Glasses of wine, 3 Shots of liquor per week   Drug use: No   Sexual activity: Not on file

## 2023-01-20 ENCOUNTER — Encounter (HOSPITAL_COMMUNITY): Payer: Self-pay | Admitting: *Deleted

## 2023-01-21 ENCOUNTER — Other Ambulatory Visit: Payer: Self-pay

## 2023-02-08 ENCOUNTER — Inpatient Hospital Stay: Payer: Medicare Other | Attending: Internal Medicine | Admitting: Internal Medicine

## 2023-02-08 ENCOUNTER — Inpatient Hospital Stay: Payer: Medicare Other

## 2023-02-08 ENCOUNTER — Encounter: Payer: Self-pay | Admitting: Medical Oncology

## 2023-02-08 ENCOUNTER — Other Ambulatory Visit: Payer: Medicare Other

## 2023-02-08 VITALS — BP 117/55 | HR 76 | Temp 97.6°F | Resp 14 | Wt 167.8 lb

## 2023-02-08 VITALS — BP 111/60 | HR 74 | Temp 98.3°F | Resp 17

## 2023-02-08 DIAGNOSIS — C7800 Secondary malignant neoplasm of unspecified lung: Secondary | ICD-10-CM

## 2023-02-08 DIAGNOSIS — Z5112 Encounter for antineoplastic immunotherapy: Secondary | ICD-10-CM | POA: Diagnosis not present

## 2023-02-08 DIAGNOSIS — C439 Malignant melanoma of skin, unspecified: Secondary | ICD-10-CM | POA: Insufficient documentation

## 2023-02-08 DIAGNOSIS — Z79899 Other long term (current) drug therapy: Secondary | ICD-10-CM | POA: Insufficient documentation

## 2023-02-08 DIAGNOSIS — C7801 Secondary malignant neoplasm of right lung: Secondary | ICD-10-CM | POA: Insufficient documentation

## 2023-02-08 DIAGNOSIS — Z95828 Presence of other vascular implants and grafts: Secondary | ICD-10-CM

## 2023-02-08 LAB — CMP (CANCER CENTER ONLY)
ALT: 13 U/L (ref 0–44)
AST: 13 U/L — ABNORMAL LOW (ref 15–41)
Albumin: 3.7 g/dL (ref 3.5–5.0)
Alkaline Phosphatase: 51 U/L (ref 38–126)
Anion gap: 9 (ref 5–15)
BUN: 22 mg/dL (ref 8–23)
CO2: 22 mmol/L (ref 22–32)
Calcium: 8.5 mg/dL — ABNORMAL LOW (ref 8.9–10.3)
Chloride: 105 mmol/L (ref 98–111)
Creatinine: 1.14 mg/dL (ref 0.61–1.24)
GFR, Estimated: >60 mL/min (ref >60)
Glucose, Bld: 228 mg/dL — ABNORMAL HIGH (ref 70–99)
Potassium: 4.5 mmol/L (ref 3.5–5.1)
Sodium: 136 mmol/L (ref 135–145)
Total Bilirubin: 0.4 mg/dL (ref 0.3–1.2)
Total Protein: 6.4 g/dL — ABNORMAL LOW (ref 6.5–8.1)

## 2023-02-08 LAB — CBC WITH DIFFERENTIAL (CANCER CENTER ONLY)
Abs Immature Granulocytes: 0.02 10*3/uL (ref 0.00–0.07)
Basophils Absolute: 0 10*3/uL (ref 0.0–0.1)
Basophils Relative: 0 %
Eosinophils Absolute: 0.2 10*3/uL (ref 0.0–0.5)
Eosinophils Relative: 4 %
HCT: 28.7 % — ABNORMAL LOW (ref 39.0–52.0)
Hemoglobin: 9.7 g/dL — ABNORMAL LOW (ref 13.0–17.0)
Immature Granulocytes: 0 %
Lymphocytes Relative: 22 %
Lymphs Abs: 1 10*3/uL (ref 0.7–4.0)
MCH: 33.4 pg (ref 26.0–34.0)
MCHC: 33.8 g/dL (ref 30.0–36.0)
MCV: 99 fL (ref 80.0–100.0)
Monocytes Absolute: 0.5 10*3/uL (ref 0.1–1.0)
Monocytes Relative: 11 %
Neutro Abs: 2.8 10*3/uL (ref 1.7–7.7)
Neutrophils Relative %: 63 %
Platelet Count: 195 10*3/uL (ref 150–400)
RBC: 2.9 MIL/uL — ABNORMAL LOW (ref 4.22–5.81)
RDW: 12.1 % (ref 11.5–15.5)
WBC Count: 4.5 10*3/uL (ref 4.0–10.5)
nRBC: 0 % (ref 0.0–0.2)

## 2023-02-08 LAB — TSH: TSH: 2.4 u[IU]/mL (ref 0.350–4.500)

## 2023-02-08 MED ORDER — HEPARIN SOD (PORK) LOCK FLUSH 100 UNIT/ML IV SOLN
500.0000 [IU] | Freq: Once | INTRAVENOUS | Status: AC | PRN
  Administered 2023-02-08: 500 [IU]

## 2023-02-08 MED ORDER — SODIUM CHLORIDE 0.9% FLUSH
10.0000 mL | Freq: Once | INTRAVENOUS | Status: AC
  Administered 2023-02-08: 10 mL

## 2023-02-08 MED ORDER — SODIUM CHLORIDE 0.9 % IV SOLN: Freq: Once | INTRAVENOUS | Status: AC

## 2023-02-08 MED ORDER — SODIUM CHLORIDE 0.9% FLUSH
10.0000 mL | INTRAVENOUS | Status: DC | PRN
  Administered 2023-02-08: 10 mL

## 2023-02-08 MED ORDER — SODIUM CHLORIDE 0.9 % IV SOLN
480.0000 mg | Freq: Once | INTRAVENOUS | Status: AC
  Administered 2023-02-08: 480 mg via INTRAVENOUS
  Filled 2023-02-08: qty 48
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
## 2023-02-08 NOTE — Patient Instructions (Signed)
[facility]  Discharge Instructions: Thank you for choosing [facility] to provide your oncology and hematology care.   If you have a lab appointment with the Cancer Center, please go directly to the Cancer Center and check in at the registration area.   Wear comfortable clothing and clothing appropriate for easy access to any Portacath or PICC line.   We strive to give you quality time with your provider. You may need to reschedule your appointment if you arrive late (15 or more minutes).  Arriving late affects you and other patients whose appointments are after yours.  Also, if you miss three or more appointments without notifying the office, you may be dismissed from the clinic at the provider's discretion.      For prescription refill requests, have your pharmacy contact our office and allow 72 hours for refills to be completed.    Today you received the following chemotherapy and/or immunotherapy agents: nivolumab      To help prevent nausea and vomiting after your treatment, we encourage you to take your nausea medication as directed.  BELOW ARE SYMPTOMS THAT SHOULD BE REPORTED IMMEDIATELY: *FEVER GREATER THAN 100.4 F (38 C) OR HIGHER *CHILLS OR SWEATING *NAUSEA AND VOMITING THAT IS NOT CONTROLLED WITH YOUR NAUSEA MEDICATION *UNUSUAL SHORTNESS OF BREATH *UNUSUAL BRUISING OR BLEEDING *URINARY PROBLEMS (pain or burning when urinating, or frequent urination) *BOWEL PROBLEMS (unusual diarrhea, constipation, pain near the anus) TENDERNESS IN MOUTH AND THROAT WITH OR WITHOUT PRESENCE OF ULCERS (sore throat, sores in mouth, or a toothache) UNUSUAL RASH, SWELLING OR PAIN  UNUSUAL VAGINAL DISCHARGE OR ITCHING   Items with * indicate a potential emergency and should be followed up as soon as possible or go to the Emergency Department if any problems should occur.  Please show the CHEMOTHERAPY ALERT CARD or IMMUNOTHERAPY ALERT CARD at  check-in to the Emergency Department and triage nurse.  Should you have questions after your visit or need to cancel or reschedule your appointment, please contact [facility]  Dept: [phone]  and follow the prompts.  Office hours are 8:00 a.m. to 4:30 p.m. Monday - Friday. Please note that voicemails left after 4:00 p.m. may not be returned until the following business day.  We are closed weekends and major holidays. You have access to a nurse at all times for urgent questions. Please call the main number to the clinic Dept: [phone] and follow the prompts.   For any non-urgent questions, you may also contact your provider using MyChart. We now offer e-Visits for anyone 18 and older to request care online for non-urgent symptoms. For details visit [email or web address].   Also download the MyChart app! Go to the app store, search "MyChart", open the app, select [facility], and log in with your MyChart username and password.   

## 2023-02-08 NOTE — Progress Notes (Signed)
Patient seen by MD today  Vitals are within treatment parameters.  Labs reviewed: and are within treatment parameters.  Per physician team, patient is ready for treatment and there are NO modifications to the treatment plan.
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
## 2023-02-09 ENCOUNTER — Other Ambulatory Visit: Payer: Medicare Other

## 2023-02-09 ENCOUNTER — Ambulatory Visit: Payer: Medicare Other

## 2023-02-09 ENCOUNTER — Ambulatory Visit: Payer: Medicare Other | Admitting: Internal Medicine

## 2023-02-09 ENCOUNTER — Other Ambulatory Visit: Payer: Self-pay

## 2023-02-10 LAB — T4: T4, Total: 5.8 ug/dL (ref 4.5–12.0)

## 2023-02-21 ENCOUNTER — Ambulatory Visit: Payer: Medicare Other | Attending: Cardiovascular Disease | Admitting: Cardiovascular Disease

## 2023-02-21 ENCOUNTER — Encounter: Payer: Self-pay | Admitting: Cardiovascular Disease

## 2023-02-21 VITALS — BP 128/60 | HR 78 | Ht 71.0 in | Wt 167.0 lb

## 2023-02-21 DIAGNOSIS — E782 Mixed hyperlipidemia: Secondary | ICD-10-CM

## 2023-02-21 DIAGNOSIS — I4819 Other persistent atrial fibrillation: Secondary | ICD-10-CM

## 2023-02-21 DIAGNOSIS — C7800 Secondary malignant neoplasm of unspecified lung: Secondary | ICD-10-CM | POA: Diagnosis not present

## 2023-02-21 NOTE — Patient Instructions (Addendum)
Medication Instructions:  Your physician recommends that you continue on your current medications as directed. Please refer to the Current Medication list given to you today.  *If you need a refill on your cardiac medications before your next appointment, please call your pharmacy*  Lab Work: If you have labs (blood work) drawn today and your tests are completely normal, you will receive your results only by: MyChart Message (if you have MyChart) OR A paper copy in the mail If you have any lab test that is abnormal or we need to change your treatment, we will call you to review the results.  Testing/Procedures: None ordered today.  Follow-Up: At [facility], you and your health needs are our priority.  As part of our continuing mission to provide you with exceptional heart care, we have created designated Provider Care Teams.  These Care Teams include your primary Cardiologist (physician) and Advanced Practice Providers (APPs -  Physician Assistants and Nurse Practitioners) who all work together to provide you with the care you need, when you need it.  We recommend signing up for the patient portal called "MyChart".  Sign up information is provided on this After Visit Summary.  MyChart is used to connect with patients for Virtual Visits (Telemedicine).  Patients are able to view lab/test results, encounter notes, upcoming appointments, etc.  Non-urgent messages can be sent to your provider as well.   To learn more about what you can do with MyChart, go to [email or web address].    Your next appointment:   6 month(s)  Provider:   Peter Nishan, MD     

## 2023-03-09 ENCOUNTER — Encounter: Payer: Self-pay | Admitting: Medical Oncology

## 2023-03-09 ENCOUNTER — Inpatient Hospital Stay: Payer: Medicare Other | Attending: Internal Medicine | Admitting: Internal Medicine

## 2023-03-09 ENCOUNTER — Encounter: Payer: Self-pay | Admitting: Internal Medicine

## 2023-03-09 ENCOUNTER — Inpatient Hospital Stay: Payer: Medicare Other

## 2023-03-09 ENCOUNTER — Other Ambulatory Visit: Payer: Medicare Other

## 2023-03-09 VITALS — BP 117/57 | HR 72 | Resp 17

## 2023-03-09 DIAGNOSIS — Z79899 Other long term (current) drug therapy: Secondary | ICD-10-CM | POA: Diagnosis not present

## 2023-03-09 DIAGNOSIS — C439 Malignant melanoma of skin, unspecified: Secondary | ICD-10-CM | POA: Insufficient documentation

## 2023-03-09 DIAGNOSIS — C7801 Secondary malignant neoplasm of right lung: Secondary | ICD-10-CM | POA: Insufficient documentation

## 2023-03-09 DIAGNOSIS — C7800 Secondary malignant neoplasm of unspecified lung: Secondary | ICD-10-CM

## 2023-03-09 DIAGNOSIS — Z5112 Encounter for antineoplastic immunotherapy: Secondary | ICD-10-CM | POA: Diagnosis present

## 2023-03-09 LAB — CMP (CANCER CENTER ONLY)
ALT: 14 U/L (ref 0–44)
AST: 16 U/L (ref 15–41)
Albumin: 4 g/dL (ref 3.5–5.0)
Alkaline Phosphatase: 42 U/L (ref 38–126)
Anion gap: 9 (ref 5–15)
BUN: 26 mg/dL — ABNORMAL HIGH (ref 8–23)
CO2: 23 mmol/L (ref 22–32)
Calcium: 9 mg/dL (ref 8.9–10.3)
Chloride: 100 mmol/L (ref 98–111)
Creatinine: 1.21 mg/dL (ref 0.61–1.24)
GFR, Estimated: >60 mL/min (ref >60)
Glucose, Bld: 221 mg/dL — ABNORMAL HIGH (ref 70–99)
Potassium: 4.1 mmol/L (ref 3.5–5.1)
Sodium: 132 mmol/L — ABNORMAL LOW (ref 135–145)
Total Bilirubin: 0.5 mg/dL (ref 0.3–1.2)
Total Protein: 7 g/dL (ref 6.5–8.1)

## 2023-03-09 LAB — CBC WITH DIFFERENTIAL (CANCER CENTER ONLY)
Abs Immature Granulocytes: 0.02 10*3/uL (ref 0.00–0.07)
Basophils Absolute: 0 10*3/uL (ref 0.0–0.1)
Basophils Relative: 1 %
Eosinophils Absolute: 0.2 10*3/uL (ref 0.0–0.5)
Eosinophils Relative: 4 %
HCT: 29.4 % — ABNORMAL LOW (ref 39.0–52.0)
Hemoglobin: 10.3 g/dL — ABNORMAL LOW (ref 13.0–17.0)
Immature Granulocytes: 1 %
Lymphocytes Relative: 26 %
Lymphs Abs: 1.1 10*3/uL (ref 0.7–4.0)
MCH: 34 pg (ref 26.0–34.0)
MCHC: 35 g/dL (ref 30.0–36.0)
MCV: 97 fL (ref 80.0–100.0)
Monocytes Absolute: 0.6 10*3/uL (ref 0.1–1.0)
Monocytes Relative: 14 %
Neutro Abs: 2.4 10*3/uL (ref 1.7–7.7)
Neutrophils Relative %: 54 %
Platelet Count: 188 10*3/uL (ref 150–400)
RBC: 3.03 MIL/uL — ABNORMAL LOW (ref 4.22–5.81)
RDW: 12.1 % (ref 11.5–15.5)
WBC Count: 4.4 10*3/uL (ref 4.0–10.5)
nRBC: 0 % (ref 0.0–0.2)

## 2023-03-09 LAB — TSH: TSH: 4.935 u[IU]/mL — ABNORMAL HIGH (ref 0.350–4.500)

## 2023-03-09 MED ORDER — SODIUM CHLORIDE 0.9% FLUSH
10.0000 mL | INTRAVENOUS | Status: DC | PRN
  Administered 2023-03-09: 10 mL

## 2023-03-09 MED ORDER — HEPARIN SOD (PORK) LOCK FLUSH 100 UNIT/ML IV SOLN
500.0000 [IU] | Freq: Once | INTRAVENOUS | Status: AC | PRN
  Administered 2023-03-09: 500 [IU]

## 2023-03-09 MED ORDER — SODIUM CHLORIDE 0.9 % IV SOLN: Freq: Once | INTRAVENOUS | Status: AC

## 2023-03-09 MED ORDER — SODIUM CHLORIDE 0.9 % IV SOLN
480.0000 mg | Freq: Once | INTRAVENOUS | Status: AC
  Administered 2023-03-09: 480 mg via INTRAVENOUS
  Filled 2023-03-09: qty 48
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
## 2023-03-09 NOTE — Progress Notes (Signed)
Patient seen by MD today  Vitals are within treatment parameters.  Labs reviewed: and are within treatment parameters.  Per physician team, patient is ready for treatment and there are NO modifications to the treatment plan.  

## 2023-03-09 NOTE — Progress Notes (Unsigned)
Patient seen by MD today  Vitals are within treatment parameters.  Labs reviewed: and are within treatment parameters.  Per physician team, patient is ready for treatment and there are NO modifications to the treatment plan.  

## 2023-03-11 LAB — T4: T4, Total: 6.8 ug/dL (ref 4.5–12.0)

## 2023-03-15 ENCOUNTER — Other Ambulatory Visit: Payer: Self-pay
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
## 2023-04-06 ENCOUNTER — Inpatient Hospital Stay (HOSPITAL_BASED_OUTPATIENT_CLINIC_OR_DEPARTMENT_OTHER): Payer: Medicare Other | Admitting: Physician Assistant

## 2023-04-06 ENCOUNTER — Inpatient Hospital Stay: Payer: Medicare Other

## 2023-04-06 ENCOUNTER — Inpatient Hospital Stay: Payer: Medicare Other | Attending: Internal Medicine

## 2023-04-06 VITALS — BP 108/55 | HR 86 | Resp 16

## 2023-04-06 VITALS — BP 130/66 | HR 80 | Temp 97.6°F | Resp 17 | Wt 166.3 lb

## 2023-04-06 DIAGNOSIS — C7800 Secondary malignant neoplasm of unspecified lung: Secondary | ICD-10-CM

## 2023-04-06 DIAGNOSIS — C439 Malignant melanoma of skin, unspecified: Secondary | ICD-10-CM | POA: Insufficient documentation

## 2023-04-06 DIAGNOSIS — Z79620 Long term (current) use of immunosuppressive biologic: Secondary | ICD-10-CM | POA: Insufficient documentation

## 2023-04-06 DIAGNOSIS — Z5112 Encounter for antineoplastic immunotherapy: Secondary | ICD-10-CM

## 2023-04-06 DIAGNOSIS — Z95828 Presence of other vascular implants and grafts: Secondary | ICD-10-CM

## 2023-04-06 DIAGNOSIS — C7801 Secondary malignant neoplasm of right lung: Secondary | ICD-10-CM | POA: Diagnosis not present

## 2023-04-06 LAB — CMP (CANCER CENTER ONLY)
ALT: 15 U/L (ref 0–44)
AST: 33 U/L (ref 15–41)
Albumin: 4.1 g/dL (ref 3.5–5.0)
Alkaline Phosphatase: 42 U/L (ref 38–126)
Anion gap: 7 (ref 5–15)
BUN: 19 mg/dL (ref 8–23)
CO2: 24 mmol/L (ref 22–32)
Calcium: 9.7 mg/dL (ref 8.9–10.3)
Chloride: 104 mmol/L (ref 98–111)
Creatinine: 1.03 mg/dL (ref 0.61–1.24)
GFR, Estimated: >60 mL/min (ref >60)
Glucose, Bld: 206 mg/dL — ABNORMAL HIGH (ref 70–99)
Potassium: 4.3 mmol/L (ref 3.5–5.1)
Sodium: 135 mmol/L (ref 135–145)
Total Bilirubin: 0.4 mg/dL (ref 0.3–1.2)
Total Protein: 7 g/dL (ref 6.5–8.1)

## 2023-04-06 LAB — CBC WITH DIFFERENTIAL (CANCER CENTER ONLY)
Abs Immature Granulocytes: 0.01 10*3/uL (ref 0.00–0.07)
Basophils Absolute: 0 10*3/uL (ref 0.0–0.1)
Basophils Relative: 0 %
Eosinophils Absolute: 0.1 10*3/uL (ref 0.0–0.5)
Eosinophils Relative: 3 %
HCT: 30.1 % — ABNORMAL LOW (ref 39.0–52.0)
Hemoglobin: 10.1 g/dL — ABNORMAL LOW (ref 13.0–17.0)
Immature Granulocytes: 0 %
Lymphocytes Relative: 23 %
Lymphs Abs: 1.1 10*3/uL (ref 0.7–4.0)
MCH: 33.3 pg (ref 26.0–34.0)
MCHC: 33.6 g/dL (ref 30.0–36.0)
MCV: 99.3 fL (ref 80.0–100.0)
Monocytes Absolute: 0.6 10*3/uL (ref 0.1–1.0)
Monocytes Relative: 13 %
Neutro Abs: 2.9 10*3/uL (ref 1.7–7.7)
Neutrophils Relative %: 61 %
Platelet Count: 182 10*3/uL (ref 150–400)
RBC: 3.03 MIL/uL — ABNORMAL LOW (ref 4.22–5.81)
RDW: 12.4 % (ref 11.5–15.5)
WBC Count: 4.7 10*3/uL (ref 4.0–10.5)
nRBC: 0 % (ref 0.0–0.2)

## 2023-04-06 LAB — TSH: TSH: 3.239 u[IU]/mL (ref 0.350–4.500)

## 2023-04-06 MED ORDER — SODIUM CHLORIDE 0.9% FLUSH
10.0000 mL | INTRAVENOUS | Status: DC | PRN
  Administered 2023-04-06: 10 mL

## 2023-04-06 MED ORDER — SODIUM CHLORIDE 0.9 % IV SOLN: Freq: Once | INTRAVENOUS | Status: AC

## 2023-04-06 MED ORDER — SODIUM CHLORIDE 0.9% FLUSH
10.0000 mL | Freq: Once | INTRAVENOUS | Status: AC
  Administered 2023-04-06: 10 mL

## 2023-04-06 MED ORDER — HEPARIN SOD (PORK) LOCK FLUSH 100 UNIT/ML IV SOLN
500.0000 [IU] | Freq: Once | INTRAVENOUS | Status: AC | PRN
  Administered 2023-04-06: 500 [IU]

## 2023-04-06 MED ORDER — SODIUM CHLORIDE 0.9 % IV SOLN
480.0000 mg | Freq: Once | INTRAVENOUS | Status: AC
  Administered 2023-04-06: 480 mg via INTRAVENOUS
  Filled 2023-04-06: qty 48

## 2023-04-06 NOTE — Progress Notes (Signed)
Patient seen by Cassie Heilingoetter, PA-C  Vitals are within treatment parameters.  Labs reviewed: and are within treatment parameters.  Per physician team, patient is ready for treatment and there are NO modifications to the treatment plan.  

## 2023-04-06 NOTE — Patient Instructions (Signed)
[facility]  Discharge Instructions: Thank you for choosing [facility] to provide your oncology and hematology care.   If you have a lab appointment with the Cancer Center, please go directly to the Cancer Center and check in at the registration area.   Wear comfortable clothing and clothing appropriate for easy access to any Portacath or PICC line.   We strive to give you quality time with your provider. You may need to reschedule your appointment if you arrive late (15 or more minutes).  Arriving late affects you and other patients whose appointments are after yours.  Also, if you miss three or more appointments without notifying the office, you may be dismissed from the clinic at the provider's discretion.      For prescription refill requests, have your pharmacy contact our office and allow 72 hours for refills to be completed.    Today you received the following chemotherapy and/or immunotherapy agents: nivolumab      To help prevent nausea and vomiting after your treatment, we encourage you to take your nausea medication as directed.  BELOW ARE SYMPTOMS THAT SHOULD BE REPORTED IMMEDIATELY: *FEVER GREATER THAN 100.4 F (38 C) OR HIGHER *CHILLS OR SWEATING *NAUSEA AND VOMITING THAT IS NOT CONTROLLED WITH YOUR NAUSEA MEDICATION *UNUSUAL SHORTNESS OF BREATH *UNUSUAL BRUISING OR BLEEDING *URINARY PROBLEMS (pain or burning when urinating, or frequent urination) *BOWEL PROBLEMS (unusual diarrhea, constipation, pain near the anus) TENDERNESS IN MOUTH AND THROAT WITH OR WITHOUT PRESENCE OF ULCERS (sore throat, sores in mouth, or a toothache) UNUSUAL RASH, SWELLING OR PAIN  UNUSUAL VAGINAL DISCHARGE OR ITCHING   Items with * indicate a potential emergency and should be followed up as soon as possible or go to the Emergency Department if any problems should occur.  Please show the CHEMOTHERAPY ALERT CARD or IMMUNOTHERAPY ALERT CARD at  check-in to the Emergency Department and triage nurse.  Should you have questions after your visit or need to cancel or reschedule your appointment, please contact [facility]  Dept: [phone]  and follow the prompts.  Office hours are 8:00 a.m. to 4:30 p.m. Monday - Friday. Please note that voicemails left after 4:00 p.m. may not be returned until the following business day.  We are closed weekends and major holidays. You have access to a nurse at all times for urgent questions. Please call the main number to the clinic Dept: [phone] and follow the prompts.   For any non-urgent questions, you may also contact your provider using MyChart. We now offer e-Visits for anyone 18 and older to request care online for non-urgent symptoms. For details visit [email or web address].   Also download the MyChart app! Go to the app store, search "MyChart", open the app, select [place], and log in with your MyChart username and password.   

## 2023-04-08 ENCOUNTER — Telehealth: Payer: Self-pay | Admitting: Internal Medicine

## 2023-04-08 LAB — T4: T4, Total: 6.5 ug/dL (ref 4.5–12.0)

## 2023-04-08 NOTE — Telephone Encounter (Signed)
Called patient regarding May/June appointments, patient is notified.  

## 2023-05-02 ENCOUNTER — Ambulatory Visit (HOSPITAL_COMMUNITY)
Admission: RE | Admit: 2023-05-02 | Discharge: 2023-05-02 | Disposition: A | Discharge status: Home / Self Care | Payer: Medicare Other | Source: Ambulatory Visit | Attending: Physician Assistant | Admitting: Physician Assistant

## 2023-05-02 DIAGNOSIS — C439 Malignant melanoma of skin, unspecified: Secondary | ICD-10-CM | POA: Diagnosis not present

## 2023-05-02 DIAGNOSIS — C7800 Secondary malignant neoplasm of unspecified lung: Secondary | ICD-10-CM | POA: Diagnosis present

## 2023-05-02 DIAGNOSIS — I2510 Atherosclerotic heart disease of native coronary artery without angina pectoris: Secondary | ICD-10-CM | POA: Diagnosis not present

## 2023-05-02 DIAGNOSIS — I700 Atherosclerosis of aorta: Secondary | ICD-10-CM | POA: Insufficient documentation

## 2023-05-02 DIAGNOSIS — K5730 Diverticulosis of large intestine without perforation or abscess without bleeding: Secondary | ICD-10-CM | POA: Diagnosis not present

## 2023-05-02 MED ORDER — IOHEXOL 300 MG/ML  SOLN
100.0000 mL | Freq: Once | INTRAMUSCULAR | Status: AC | PRN
  Administered 2023-05-02: 100 mL via INTRAVENOUS

## 2023-05-04 ENCOUNTER — Inpatient Hospital Stay (HOSPITAL_BASED_OUTPATIENT_CLINIC_OR_DEPARTMENT_OTHER): Payer: Medicare Other | Admitting: Internal Medicine

## 2023-05-04 ENCOUNTER — Inpatient Hospital Stay: Payer: Medicare Other

## 2023-05-04 ENCOUNTER — Encounter: Payer: Self-pay | Admitting: Internal Medicine

## 2023-05-04 ENCOUNTER — Encounter: Payer: Self-pay | Admitting: Medical Oncology

## 2023-05-04 ENCOUNTER — Inpatient Hospital Stay: Payer: Medicare Other | Attending: Internal Medicine

## 2023-05-04 VITALS — BP 137/64 | HR 72 | Temp 98.2°F | Resp 16 | Wt 165.4 lb

## 2023-05-04 DIAGNOSIS — C7801 Secondary malignant neoplasm of right lung: Secondary | ICD-10-CM | POA: Insufficient documentation

## 2023-05-04 DIAGNOSIS — D649 Anemia, unspecified: Secondary | ICD-10-CM | POA: Diagnosis not present

## 2023-05-04 DIAGNOSIS — C7800 Secondary malignant neoplasm of unspecified lung: Secondary | ICD-10-CM | POA: Diagnosis not present

## 2023-05-04 DIAGNOSIS — C439 Malignant melanoma of skin, unspecified: Secondary | ICD-10-CM | POA: Diagnosis present

## 2023-05-04 DIAGNOSIS — J90 Pleural effusion, not elsewhere classified: Secondary | ICD-10-CM | POA: Insufficient documentation

## 2023-05-04 DIAGNOSIS — Z5112 Encounter for antineoplastic immunotherapy: Secondary | ICD-10-CM | POA: Diagnosis present

## 2023-05-04 DIAGNOSIS — Z79620 Long term (current) use of immunosuppressive biologic: Secondary | ICD-10-CM | POA: Diagnosis not present

## 2023-05-04 DIAGNOSIS — Z95828 Presence of other vascular implants and grafts: Secondary | ICD-10-CM

## 2023-05-04 LAB — CBC WITH DIFFERENTIAL (CANCER CENTER ONLY)
Abs Immature Granulocytes: 0.02 10*3/uL (ref 0.00–0.07)
Basophils Absolute: 0 10*3/uL (ref 0.0–0.1)
Basophils Relative: 0 %
Eosinophils Absolute: 0.2 10*3/uL (ref 0.0–0.5)
Eosinophils Relative: 5 %
HCT: 28.6 % — ABNORMAL LOW (ref 39.0–52.0)
Hemoglobin: 9.7 g/dL — ABNORMAL LOW (ref 13.0–17.0)
Immature Granulocytes: 1 %
Lymphocytes Relative: 26 %
Lymphs Abs: 1 10*3/uL (ref 0.7–4.0)
MCH: 33.8 pg (ref 26.0–34.0)
MCHC: 33.9 g/dL (ref 30.0–36.0)
MCV: 99.7 fL (ref 80.0–100.0)
Monocytes Absolute: 0.4 10*3/uL (ref 0.1–1.0)
Monocytes Relative: 11 %
Neutro Abs: 2.1 10*3/uL (ref 1.7–7.7)
Neutrophils Relative %: 57 %
Platelet Count: 180 10*3/uL (ref 150–400)
RBC: 2.87 MIL/uL — ABNORMAL LOW (ref 4.22–5.81)
RDW: 12.1 % (ref 11.5–15.5)
WBC Count: 3.7 10*3/uL — ABNORMAL LOW (ref 4.0–10.5)
nRBC: 0 % (ref 0.0–0.2)

## 2023-05-04 LAB — CMP (CANCER CENTER ONLY)
ALT: 16 U/L (ref 0–44)
AST: 16 U/L (ref 15–41)
Albumin: 3.9 g/dL (ref 3.5–5.0)
Alkaline Phosphatase: 46 U/L (ref 38–126)
Anion gap: 8 (ref 5–15)
BUN: 17 mg/dL (ref 8–23)
CO2: 23 mmol/L (ref 22–32)
Calcium: 8.8 mg/dL — ABNORMAL LOW (ref 8.9–10.3)
Chloride: 103 mmol/L (ref 98–111)
Creatinine: 1.04 mg/dL (ref 0.61–1.24)
GFR, Estimated: >60 mL/min (ref >60)
Glucose, Bld: 270 mg/dL — ABNORMAL HIGH (ref 70–99)
Potassium: 4.3 mmol/L (ref 3.5–5.1)
Sodium: 134 mmol/L — ABNORMAL LOW (ref 135–145)
Total Bilirubin: 0.4 mg/dL (ref 0.3–1.2)
Total Protein: 6.5 g/dL (ref 6.5–8.1)

## 2023-05-04 LAB — TSH: TSH: 4.691 u[IU]/mL — ABNORMAL HIGH (ref 0.350–4.500)

## 2023-05-04 MED ORDER — SODIUM CHLORIDE 0.9 % IV SOLN: Freq: Once | INTRAVENOUS | Status: AC

## 2023-05-04 MED ORDER — SODIUM CHLORIDE 0.9 % IV SOLN
480.0000 mg | Freq: Once | INTRAVENOUS | Status: AC
  Administered 2023-05-04: 480 mg via INTRAVENOUS
  Filled 2023-05-04: qty 48

## 2023-05-04 MED ORDER — HEPARIN SOD (PORK) LOCK FLUSH 100 UNIT/ML IV SOLN: 500.0000 [IU] | Freq: Once | INTRAVENOUS | Status: DC | PRN

## 2023-05-04 MED ORDER — SODIUM CHLORIDE 0.9% FLUSH
10.0000 mL | Freq: Once | INTRAVENOUS | Status: AC
  Administered 2023-05-04: 10 mL

## 2023-05-04 MED ORDER — SODIUM CHLORIDE 0.9% FLUSH: 10.0000 mL | INTRAVENOUS | Status: DC | PRN

## 2023-05-04 NOTE — Progress Notes (Signed)
Patient seen by Dr. Gypsy Balsam are within treatment parameters.  Labs reviewed: and are within treatment parameters.  Per physician team, patient is ready for treatment and there are NO modifications to the treatment plan. NIvolumab.
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
## 2023-05-04 NOTE — Patient Instructions (Signed)
[facility]  Discharge Instructions: Thank you for choosing [facility] to provide your oncology and hematology care.   If you have a lab appointment with the Cancer Center, please go directly to the Cancer Center and check in at the registration area.   Wear comfortable clothing and clothing appropriate for easy access to any Portacath or PICC line.   We strive to give you quality time with your provider. You may need to reschedule your appointment if you arrive late (15 or more minutes).  Arriving late affects you and other patients whose appointments are after yours.  Also, if you miss three or more appointments without notifying the office, you may be dismissed from the clinic at the provider's discretion.      For prescription refill requests, have your pharmacy contact our office and allow 72 hours for refills to be completed.    Today you received the following chemotherapy and/or immunotherapy agents: nivolumab      To help prevent nausea and vomiting after your treatment, we encourage you to take your nausea medication as directed.  BELOW ARE SYMPTOMS THAT SHOULD BE REPORTED IMMEDIATELY: *FEVER GREATER THAN 100.4 F (38 C) OR HIGHER *CHILLS OR SWEATING *NAUSEA AND VOMITING THAT IS NOT CONTROLLED WITH YOUR NAUSEA MEDICATION *UNUSUAL SHORTNESS OF BREATH *UNUSUAL BRUISING OR BLEEDING *URINARY PROBLEMS (pain or burning when urinating, or frequent urination) *BOWEL PROBLEMS (unusual diarrhea, constipation, pain near the anus) TENDERNESS IN MOUTH AND THROAT WITH OR WITHOUT PRESENCE OF ULCERS (sore throat, sores in mouth, or a toothache) UNUSUAL RASH, SWELLING OR PAIN  UNUSUAL VAGINAL DISCHARGE OR ITCHING   Items with * indicate a potential emergency and should be followed up as soon as possible or go to the Emergency Department if any problems should occur.  Please show the CHEMOTHERAPY ALERT CARD or IMMUNOTHERAPY ALERT CARD at  check-in to the Emergency Department and triage nurse.  Should you have questions after your visit or need to cancel or reschedule your appointment, please contact [facility]  Dept: [phone]  and follow the prompts.  Office hours are 8:00 a.m. to 4:30 p.m. Monday - Friday. Please note that voicemails left after 4:00 p.m. may not be returned until the following business day.  We are closed weekends and major holidays. You have access to a nurse at all times for urgent questions. Please call the main number to the clinic Dept: [phone] and follow the prompts.   For any non-urgent questions, you may also contact your provider using MyChart. We now offer e-Visits for anyone 18 and older to request care online for non-urgent symptoms. For details visit [email or web address].   Also download the MyChart app! Go to the app store, search "MyChart", open the app, select [place], and log in with your MyChart username and password.   

## 2023-05-06 LAB — T4: T4, Total: 5.8 ug/dL (ref 4.5–12.0)

## 2023-06-01 ENCOUNTER — Inpatient Hospital Stay: Payer: Medicare Other

## 2023-06-01 ENCOUNTER — Other Ambulatory Visit: Payer: Self-pay

## 2023-06-01 ENCOUNTER — Encounter: Payer: Self-pay | Admitting: Medical Oncology

## 2023-06-01 ENCOUNTER — Inpatient Hospital Stay: Payer: Medicare Other | Attending: Internal Medicine | Admitting: Internal Medicine

## 2023-06-01 DIAGNOSIS — C7800 Secondary malignant neoplasm of unspecified lung: Secondary | ICD-10-CM | POA: Diagnosis not present

## 2023-06-01 DIAGNOSIS — Z5112 Encounter for antineoplastic immunotherapy: Secondary | ICD-10-CM | POA: Diagnosis present

## 2023-06-01 DIAGNOSIS — Z79620 Long term (current) use of immunosuppressive biologic: Secondary | ICD-10-CM | POA: Insufficient documentation

## 2023-06-01 DIAGNOSIS — C439 Malignant melanoma of skin, unspecified: Secondary | ICD-10-CM | POA: Insufficient documentation

## 2023-06-01 DIAGNOSIS — C7801 Secondary malignant neoplasm of right lung: Secondary | ICD-10-CM | POA: Diagnosis not present

## 2023-06-01 DIAGNOSIS — Z95828 Presence of other vascular implants and grafts: Secondary | ICD-10-CM

## 2023-06-01 DIAGNOSIS — D649 Anemia, unspecified: Secondary | ICD-10-CM | POA: Diagnosis not present

## 2023-06-01 LAB — CMP (CANCER CENTER ONLY)
ALT: 12 U/L (ref 0–44)
AST: 15 U/L (ref 15–41)
Albumin: 3.9 g/dL (ref 3.5–5.0)
Alkaline Phosphatase: 41 U/L (ref 38–126)
Anion gap: 8 (ref 5–15)
BUN: 16 mg/dL (ref 8–23)
CO2: 24 mmol/L (ref 22–32)
Calcium: 9.5 mg/dL (ref 8.9–10.3)
Chloride: 98 mmol/L (ref 98–111)
Creatinine: 1.03 mg/dL (ref 0.61–1.24)
GFR, Estimated: >60 mL/min (ref >60)
Glucose, Bld: 154 mg/dL — ABNORMAL HIGH (ref 70–99)
Potassium: 4.1 mmol/L (ref 3.5–5.1)
Sodium: 130 mmol/L — ABNORMAL LOW (ref 135–145)
Total Bilirubin: 0.5 mg/dL (ref 0.3–1.2)
Total Protein: 6.6 g/dL (ref 6.5–8.1)

## 2023-06-01 LAB — CBC WITH DIFFERENTIAL (CANCER CENTER ONLY)
Abs Immature Granulocytes: 0.03 10*3/uL (ref 0.00–0.07)
Basophils Absolute: 0 10*3/uL (ref 0.0–0.1)
Basophils Relative: 1 %
Eosinophils Absolute: 0.2 10*3/uL (ref 0.0–0.5)
Eosinophils Relative: 5 %
HCT: 29.7 % — ABNORMAL LOW (ref 39.0–52.0)
Hemoglobin: 10.2 g/dL — ABNORMAL LOW (ref 13.0–17.0)
Immature Granulocytes: 1 %
Lymphocytes Relative: 35 %
Lymphs Abs: 1.3 10*3/uL (ref 0.7–4.0)
MCH: 33.6 pg (ref 26.0–34.0)
MCHC: 34.3 g/dL (ref 30.0–36.0)
MCV: 97.7 fL (ref 80.0–100.0)
Monocytes Absolute: 0.5 10*3/uL (ref 0.1–1.0)
Monocytes Relative: 14 %
Neutro Abs: 1.7 10*3/uL (ref 1.7–7.7)
Neutrophils Relative %: 44 %
Platelet Count: 194 10*3/uL (ref 150–400)
RBC: 3.04 MIL/uL — ABNORMAL LOW (ref 4.22–5.81)
RDW: 11.7 % (ref 11.5–15.5)
WBC Count: 3.8 10*3/uL — ABNORMAL LOW (ref 4.0–10.5)
nRBC: 0 % (ref 0.0–0.2)

## 2023-06-01 LAB — TSH: TSH: 4.688 u[IU]/mL — ABNORMAL HIGH (ref 0.350–4.500)

## 2023-06-01 MED ORDER — HEPARIN SOD (PORK) LOCK FLUSH 100 UNIT/ML IV SOLN
500.0000 [IU] | Freq: Once | INTRAVENOUS | Status: AC | PRN
  Administered 2023-06-01: 500 [IU]

## 2023-06-01 MED ORDER — SODIUM CHLORIDE 0.9 % IV SOLN: Freq: Once | INTRAVENOUS | Status: AC

## 2023-06-01 MED ORDER — SODIUM CHLORIDE 0.9% FLUSH
10.0000 mL | Freq: Once | INTRAVENOUS | Status: AC
  Administered 2023-06-01: 10 mL

## 2023-06-01 MED ORDER — SODIUM CHLORIDE 0.9% FLUSH
10.0000 mL | INTRAVENOUS | Status: DC | PRN
  Administered 2023-06-01: 10 mL

## 2023-06-01 MED ORDER — SODIUM CHLORIDE 0.9 % IV SOLN
480.0000 mg | Freq: Once | INTRAVENOUS | Status: AC
  Administered 2023-06-01: 480 mg via INTRAVENOUS
  Filled 2023-06-01: qty 48

## 2023-06-01 NOTE — Patient Instructions (Signed)
[facility]  Discharge Instructions: Thank you for choosing [facility] to provide your oncology and hematology care.   If you have a lab appointment with the Cancer Center, please go directly to the Cancer Center and check in at the registration area.   Wear comfortable clothing and clothing appropriate for easy access to any Portacath or PICC line.   We strive to give you quality time with your provider. You may need to reschedule your appointment if you arrive late (15 or more minutes).  Arriving late affects you and other patients whose appointments are after yours.  Also, if you miss three or more appointments without notifying the office, you may be dismissed from the clinic at the provider's discretion.      For prescription refill requests, have your pharmacy contact our office and allow 72 hours for refills to be completed.    Today you received the following chemotherapy and/or immunotherapy agents: nivolumab      To help prevent nausea and vomiting after your treatment, we encourage you to take your nausea medication as directed.  BELOW ARE SYMPTOMS THAT SHOULD BE REPORTED IMMEDIATELY: *FEVER GREATER THAN 100.4 F (38 C) OR HIGHER *CHILLS OR SWEATING *NAUSEA AND VOMITING THAT IS NOT CONTROLLED WITH YOUR NAUSEA MEDICATION *UNUSUAL SHORTNESS OF BREATH *UNUSUAL BRUISING OR BLEEDING *URINARY PROBLEMS (pain or burning when urinating, or frequent urination) *BOWEL PROBLEMS (unusual diarrhea, constipation, pain near the anus) TENDERNESS IN MOUTH AND THROAT WITH OR WITHOUT PRESENCE OF ULCERS (sore throat, sores in mouth, or a toothache) UNUSUAL RASH, SWELLING OR PAIN  UNUSUAL VAGINAL DISCHARGE OR ITCHING   Items with * indicate a potential emergency and should be followed up as soon as possible or go to the Emergency Department if any problems should occur.  Please show the CHEMOTHERAPY ALERT CARD or IMMUNOTHERAPY ALERT CARD at  check-in to the Emergency Department and triage nurse.  Should you have questions after your visit or need to cancel or reschedule your appointment, please contact [facility]  Dept: [phone]  and follow the prompts.  Office hours are 8:00 a.m. to 4:30 p.m. Monday - Friday. Please note that voicemails left after 4:00 p.m. may not be returned until the following business day.  We are closed weekends and major holidays. You have access to a nurse at all times for urgent questions. Please call the main number to the clinic Dept: [phone] and follow the prompts.   For any non-urgent questions, you may also contact your provider using MyChart. We now offer e-Visits for anyone 18 and older to request care online for non-urgent symptoms. For details visit [email or web address].   Also download the MyChart app! Go to the app store, search "MyChart", open the app, select [facility], and log in with your MyChart username and password.   

## 2023-06-01 NOTE — Progress Notes (Signed)
Patient seen by Dr. Mohamed  Vitals are within treatment parameters.  Labs reviewed: and are within treatment parameters.  Per physician team, patient is ready for treatment and there are NO modifications to the treatment plan.  
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
## 2023-06-03 LAB — T4: T4, Total: 6.6 ug/dL (ref 4.5–12.0)
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
## 2023-06-29 ENCOUNTER — Inpatient Hospital Stay: Payer: Medicare Other | Attending: Internal Medicine

## 2023-06-29 ENCOUNTER — Other Ambulatory Visit: Payer: Self-pay

## 2023-06-29 ENCOUNTER — Inpatient Hospital Stay: Payer: Medicare Other | Admitting: Physician Assistant

## 2023-06-29 ENCOUNTER — Inpatient Hospital Stay: Payer: Medicare Other

## 2023-06-29 VITALS — BP 128/66 | HR 67 | Resp 16

## 2023-06-29 DIAGNOSIS — C439 Malignant melanoma of skin, unspecified: Secondary | ICD-10-CM | POA: Insufficient documentation

## 2023-06-29 DIAGNOSIS — C7800 Secondary malignant neoplasm of unspecified lung: Secondary | ICD-10-CM

## 2023-06-29 DIAGNOSIS — Z95828 Presence of other vascular implants and grafts: Secondary | ICD-10-CM

## 2023-06-29 DIAGNOSIS — Z5112 Encounter for antineoplastic immunotherapy: Secondary | ICD-10-CM | POA: Insufficient documentation

## 2023-06-29 DIAGNOSIS — C7951 Secondary malignant neoplasm of bone: Secondary | ICD-10-CM | POA: Insufficient documentation

## 2023-06-29 DIAGNOSIS — C7801 Secondary malignant neoplasm of right lung: Secondary | ICD-10-CM | POA: Insufficient documentation

## 2023-06-29 DIAGNOSIS — Z79620 Long term (current) use of immunosuppressive biologic: Secondary | ICD-10-CM | POA: Insufficient documentation

## 2023-06-29 LAB — CBC WITH DIFFERENTIAL (CANCER CENTER ONLY)
Abs Immature Granulocytes: 0.02 10*3/uL (ref 0.00–0.07)
Basophils Absolute: 0 10*3/uL (ref 0.0–0.1)
Basophils Relative: 1 %
Eosinophils Absolute: 0.2 10*3/uL (ref 0.0–0.5)
Eosinophils Relative: 4 %
HCT: 28.6 % — ABNORMAL LOW (ref 39.0–52.0)
Hemoglobin: 9.8 g/dL — ABNORMAL LOW (ref 13.0–17.0)
Immature Granulocytes: 1 %
Lymphocytes Relative: 29 %
Lymphs Abs: 1.1 10*3/uL (ref 0.7–4.0)
MCH: 33.6 pg (ref 26.0–34.0)
MCHC: 34.3 g/dL (ref 30.0–36.0)
MCV: 97.9 fL (ref 80.0–100.0)
Monocytes Absolute: 0.5 10*3/uL (ref 0.1–1.0)
Monocytes Relative: 14 %
Neutro Abs: 2 10*3/uL (ref 1.7–7.7)
Neutrophils Relative %: 51 %
Platelet Count: 174 10*3/uL (ref 150–400)
RBC: 2.92 MIL/uL — ABNORMAL LOW (ref 4.22–5.81)
RDW: 11.9 % (ref 11.5–15.5)
WBC Count: 3.9 10*3/uL — ABNORMAL LOW (ref 4.0–10.5)
nRBC: 0 % (ref 0.0–0.2)

## 2023-06-29 LAB — CMP (CANCER CENTER ONLY)
ALT: 13 U/L (ref 0–44)
AST: 15 U/L (ref 15–41)
Albumin: 4 g/dL (ref 3.5–5.0)
Alkaline Phosphatase: 41 U/L (ref 38–126)
Anion gap: 8 (ref 5–15)
BUN: 19 mg/dL (ref 8–23)
CO2: 23 mmol/L (ref 22–32)
Calcium: 9.4 mg/dL (ref 8.9–10.3)
Chloride: 103 mmol/L (ref 98–111)
Creatinine: 1.21 mg/dL (ref 0.61–1.24)
GFR, Estimated: >60 mL/min (ref >60)
Glucose, Bld: 103 mg/dL — ABNORMAL HIGH (ref 70–99)
Potassium: 4 mmol/L (ref 3.5–5.1)
Sodium: 134 mmol/L — ABNORMAL LOW (ref 135–145)
Total Bilirubin: 0.6 mg/dL (ref 0.3–1.2)
Total Protein: 6.8 g/dL (ref 6.5–8.1)

## 2023-06-29 LAB — TSH: TSH: 5.778 u[IU]/mL — ABNORMAL HIGH (ref 0.350–4.500)

## 2023-06-29 MED ORDER — HEPARIN SOD (PORK) LOCK FLUSH 100 UNIT/ML IV SOLN
500.0000 [IU] | Freq: Once | INTRAVENOUS | Status: AC | PRN
  Administered 2023-06-29: 500 [IU]

## 2023-06-29 MED ORDER — SODIUM CHLORIDE 0.9% FLUSH
10.0000 mL | Freq: Once | INTRAVENOUS | Status: AC
  Administered 2023-06-29: 10 mL

## 2023-06-29 MED ORDER — SODIUM CHLORIDE 0.9% FLUSH
10.0000 mL | INTRAVENOUS | Status: DC | PRN
  Administered 2023-06-29: 10 mL

## 2023-06-29 MED ORDER — SODIUM CHLORIDE 0.9 % IV SOLN
480.0000 mg | Freq: Once | INTRAVENOUS | Status: AC
  Administered 2023-06-29: 480 mg via INTRAVENOUS
  Filled 2023-06-29: qty 48

## 2023-06-29 MED ORDER — SODIUM CHLORIDE 0.9 % IV SOLN: Freq: Once | INTRAVENOUS | Status: AC

## 2023-06-29 NOTE — Patient Instructions (Signed)
[facility]  Discharge Instructions: Thank you for choosing [facility] to provide your oncology and hematology care.   If you have a lab appointment with the Cancer Center, please go directly to the Cancer Center and check in at the registration area.   Wear comfortable clothing and clothing appropriate for easy access to any Portacath or PICC line.   We strive to give you quality time with your provider. You may need to reschedule your appointment if you arrive late (15 or more minutes).  Arriving late affects you and other patients whose appointments are after yours.  Also, if you miss three or more appointments without notifying the office, you may be dismissed from the clinic at the provider's discretion.      For prescription refill requests, have your pharmacy contact our office and allow 72 hours for refills to be completed.    Today you received the following chemotherapy and/or immunotherapy agents: nivolumab      To help prevent nausea and vomiting after your treatment, we encourage you to take your nausea medication as directed.  BELOW ARE SYMPTOMS THAT SHOULD BE REPORTED IMMEDIATELY: *FEVER GREATER THAN 100.4 F (38 C) OR HIGHER *CHILLS OR SWEATING *NAUSEA AND VOMITING THAT IS NOT CONTROLLED WITH YOUR NAUSEA MEDICATION *UNUSUAL SHORTNESS OF BREATH *UNUSUAL BRUISING OR BLEEDING *URINARY PROBLEMS (pain or burning when urinating, or frequent urination) *BOWEL PROBLEMS (unusual diarrhea, constipation, pain near the anus) TENDERNESS IN MOUTH AND THROAT WITH OR WITHOUT PRESENCE OF ULCERS (sore throat, sores in mouth, or a toothache) UNUSUAL RASH, SWELLING OR PAIN  UNUSUAL VAGINAL DISCHARGE OR ITCHING   Items with * indicate a potential emergency and should be followed up as soon as possible or go to the Emergency Department if any problems should occur.  Please show the CHEMOTHERAPY ALERT CARD or IMMUNOTHERAPY ALERT CARD at  check-in to the Emergency Department and triage nurse.  Should you have questions after your visit or need to cancel or reschedule your appointment, please contact [facility]  Dept: [phone]  and follow the prompts.  Office hours are 8:00 a.m. to 4:30 p.m. Monday - Friday. Please note that voicemails left after 4:00 p.m. may not be returned until the following business day.  We are closed weekends and major holidays. You have access to a nurse at all times for urgent questions. Please call the main number to the clinic Dept: [phone] and follow the prompts.   For any non-urgent questions, you may also contact your provider using MyChart. We now offer e-Visits for anyone 18 and older to request care online for non-urgent symptoms. For details visit [email or web address].   Also download the MyChart app! Go to the app store, search "MyChart", open the app, select [place], and log in with your MyChart username and password.   

## 2023-06-30 LAB — T4: T4, Total: 6.4 ug/dL (ref 4.5–12.0)
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
## 2023-07-27 ENCOUNTER — Inpatient Hospital Stay: Payer: Medicare Other | Admitting: Physician Assistant

## 2023-07-27 ENCOUNTER — Other Ambulatory Visit: Payer: Self-pay

## 2023-07-27 ENCOUNTER — Inpatient Hospital Stay: Payer: Medicare Other

## 2023-07-27 ENCOUNTER — Inpatient Hospital Stay: Payer: Medicare Other | Attending: Internal Medicine

## 2023-07-27 VITALS — BP 132/69 | HR 66 | Temp 97.5°F | Resp 13 | Wt 164.4 lb

## 2023-07-27 DIAGNOSIS — Z5112 Encounter for antineoplastic immunotherapy: Secondary | ICD-10-CM | POA: Insufficient documentation

## 2023-07-27 DIAGNOSIS — C7801 Secondary malignant neoplasm of right lung: Secondary | ICD-10-CM | POA: Insufficient documentation

## 2023-07-27 DIAGNOSIS — C439 Malignant melanoma of skin, unspecified: Secondary | ICD-10-CM | POA: Diagnosis not present

## 2023-07-27 DIAGNOSIS — J90 Pleural effusion, not elsewhere classified: Secondary | ICD-10-CM | POA: Insufficient documentation

## 2023-07-27 DIAGNOSIS — Z79620 Long term (current) use of immunosuppressive biologic: Secondary | ICD-10-CM | POA: Diagnosis not present

## 2023-07-27 DIAGNOSIS — C7800 Secondary malignant neoplasm of unspecified lung: Secondary | ICD-10-CM

## 2023-07-27 LAB — CMP (CANCER CENTER ONLY)
ALT: 22 U/L (ref 0–44)
AST: 19 U/L (ref 15–41)
Albumin: 4 g/dL (ref 3.5–5.0)
Alkaline Phosphatase: 42 U/L (ref 38–126)
Anion gap: 5 (ref 5–15)
BUN: 14 mg/dL (ref 8–23)
CO2: 26 mmol/L (ref 22–32)
Calcium: 8.9 mg/dL (ref 8.9–10.3)
Chloride: 102 mmol/L (ref 98–111)
Creatinine: 1 mg/dL (ref 0.61–1.24)
GFR, Estimated: >60 mL/min (ref >60)
Glucose, Bld: 222 mg/dL — ABNORMAL HIGH (ref 70–99)
Potassium: 4.4 mmol/L (ref 3.5–5.1)
Sodium: 133 mmol/L — ABNORMAL LOW (ref 135–145)
Total Bilirubin: 0.5 mg/dL (ref 0.3–1.2)
Total Protein: 6.8 g/dL (ref 6.5–8.1)

## 2023-07-27 LAB — CBC WITH DIFFERENTIAL (CANCER CENTER ONLY)
Abs Immature Granulocytes: 0.01 10*3/uL (ref 0.00–0.07)
Basophils Absolute: 0 10*3/uL (ref 0.0–0.1)
Basophils Relative: 1 %
Eosinophils Absolute: 0.2 10*3/uL (ref 0.0–0.5)
Eosinophils Relative: 4 %
HCT: 28.8 % — ABNORMAL LOW (ref 39.0–52.0)
Hemoglobin: 10 g/dL — ABNORMAL LOW (ref 13.0–17.0)
Immature Granulocytes: 0 %
Lymphocytes Relative: 27 %
Lymphs Abs: 1 10*3/uL (ref 0.7–4.0)
MCH: 34 pg (ref 26.0–34.0)
MCHC: 34.7 g/dL (ref 30.0–36.0)
MCV: 98 fL (ref 80.0–100.0)
Monocytes Absolute: 0.4 10*3/uL (ref 0.1–1.0)
Monocytes Relative: 11 %
Neutro Abs: 2.1 10*3/uL (ref 1.7–7.7)
Neutrophils Relative %: 57 %
Platelet Count: 190 10*3/uL (ref 150–400)
RBC: 2.94 MIL/uL — ABNORMAL LOW (ref 4.22–5.81)
RDW: 11.9 % (ref 11.5–15.5)
WBC Count: 3.7 10*3/uL — ABNORMAL LOW (ref 4.0–10.5)
nRBC: 0 % (ref 0.0–0.2)

## 2023-07-27 LAB — TSH: TSH: 3.676 u[IU]/mL (ref 0.350–4.500)

## 2023-07-27 MED ORDER — SODIUM CHLORIDE 0.9 % IV SOLN
480.0000 mg | Freq: Once | INTRAVENOUS | Status: AC
  Administered 2023-07-27: 480 mg via INTRAVENOUS
  Filled 2023-07-27: qty 48

## 2023-07-27 MED ORDER — HEPARIN SOD (PORK) LOCK FLUSH 100 UNIT/ML IV SOLN
500.0000 [IU] | Freq: Once | INTRAVENOUS | Status: AC | PRN
  Administered 2023-07-27: 500 [IU]

## 2023-07-27 MED ORDER — SODIUM CHLORIDE 0.9 % IV SOLN: Freq: Once | INTRAVENOUS | Status: AC

## 2023-07-27 MED ORDER — SODIUM CHLORIDE 0.9% FLUSH
10.0000 mL | INTRAVENOUS | Status: DC | PRN
  Administered 2023-07-27: 10 mL

## 2023-07-27 NOTE — Patient Instructions (Signed)
[facility]  Discharge Instructions: Thank you for choosing [facility] to provide your oncology and hematology care.   If you have a lab appointment with the Cancer Center, please go directly to the Cancer Center and check in at the registration area.   Wear comfortable clothing and clothing appropriate for easy access to any Portacath or PICC line.   We strive to give you quality time with your provider. You may need to reschedule your appointment if you arrive late (15 or more minutes).  Arriving late affects you and other patients whose appointments are after yours.  Also, if you miss three or more appointments without notifying the office, you may be dismissed from the clinic at the provider's discretion.      For prescription refill requests, have your pharmacy contact our office and allow 72 hours for refills to be completed.    Today you received the following chemotherapy and/or immunotherapy agents: nivolumab      To help prevent nausea and vomiting after your treatment, we encourage you to take your nausea medication as directed.  BELOW ARE SYMPTOMS THAT SHOULD BE REPORTED IMMEDIATELY: *FEVER GREATER THAN 100.4 F (38 C) OR HIGHER *CHILLS OR SWEATING *NAUSEA AND VOMITING THAT IS NOT CONTROLLED WITH YOUR NAUSEA MEDICATION *UNUSUAL SHORTNESS OF BREATH *UNUSUAL BRUISING OR BLEEDING *URINARY PROBLEMS (pain or burning when urinating, or frequent urination) *BOWEL PROBLEMS (unusual diarrhea, constipation, pain near the anus) TENDERNESS IN MOUTH AND THROAT WITH OR WITHOUT PRESENCE OF ULCERS (sore throat, sores in mouth, or a toothache) UNUSUAL RASH, SWELLING OR PAIN  UNUSUAL VAGINAL DISCHARGE OR ITCHING   Items with * indicate a potential emergency and should be followed up as soon as possible or go to the Emergency Department if any problems should occur.  Please show the CHEMOTHERAPY ALERT CARD or IMMUNOTHERAPY ALERT CARD at  check-in to the Emergency Department and triage nurse.  Should you have questions after your visit or need to cancel or reschedule your appointment, please contact [facility]  Dept: [phone]  and follow the prompts.  Office hours are 8:00 a.m. to 4:30 p.m. Monday - Friday. Please note that voicemails left after 4:00 p.m. may not be returned until the following business day.  We are closed weekends and major holidays. You have access to a nurse at all times for urgent questions. Please call the main number to the clinic Dept: [phone] and follow the prompts.   For any non-urgent questions, you may also contact your provider using MyChart. We now offer e-Visits for anyone 18 and older to request care online for non-urgent symptoms. For details visit [email or web address].   Also download the MyChart app! Go to the app store, search "MyChart", open the app, select [place], and log in with your MyChart username and password.   

## 2023-07-28 LAB — T4: T4, Total: 6.9 ug/dL (ref 4.5–12.0)

## 2023-08-12 ENCOUNTER — Other Ambulatory Visit: Payer: Self-pay

## 2023-08-17 ENCOUNTER — Ambulatory Visit (HOSPITAL_COMMUNITY)
Admission: RE | Admit: 2023-08-17 | Discharge: 2023-08-17 | Disposition: A | Discharge status: Home / Self Care | Payer: Medicare Other | Source: Ambulatory Visit | Attending: Physician Assistant | Admitting: Physician Assistant

## 2023-08-17 DIAGNOSIS — K5730 Diverticulosis of large intestine without perforation or abscess without bleeding: Secondary | ICD-10-CM | POA: Insufficient documentation

## 2023-08-17 DIAGNOSIS — I700 Atherosclerosis of aorta: Secondary | ICD-10-CM | POA: Insufficient documentation

## 2023-08-17 DIAGNOSIS — C7800 Secondary malignant neoplasm of unspecified lung: Secondary | ICD-10-CM | POA: Diagnosis present

## 2023-08-17 MED ORDER — HEPARIN SOD (PORK) LOCK FLUSH 100 UNIT/ML IV SOLN
500.0000 [IU] | Freq: Once | INTRAVENOUS | Status: AC
  Administered 2023-08-17: 500 [IU] via INTRAVENOUS

## 2023-08-17 MED ORDER — HEPARIN SOD (PORK) LOCK FLUSH 100 UNIT/ML IV SOLN
INTRAVENOUS | Status: AC
  Filled 2023-08-17: qty 5

## 2023-08-17 MED ORDER — IOHEXOL 300 MG/ML  SOLN
100.0000 mL | Freq: Once | INTRAMUSCULAR | Status: AC | PRN
  Administered 2023-08-17: 100 mL via INTRAVENOUS

## 2023-08-19 ENCOUNTER — Other Ambulatory Visit: Payer: Self-pay
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
## 2023-08-23 ENCOUNTER — Encounter: Payer: Self-pay | Admitting: Physician Assistant

## 2023-08-23 ENCOUNTER — Encounter: Payer: Self-pay | Admitting: Internal Medicine

## 2023-08-24 ENCOUNTER — Inpatient Hospital Stay: Payer: Medicare Other

## 2023-08-24 ENCOUNTER — Inpatient Hospital Stay: Payer: Medicare Other | Attending: Internal Medicine

## 2023-08-24 ENCOUNTER — Inpatient Hospital Stay: Payer: Medicare Other | Admitting: Physician Assistant

## 2023-08-24 VITALS — BP 120/66 | HR 67 | Temp 98.7°F | Resp 16

## 2023-08-24 VITALS — BP 120/63 | HR 74 | Temp 98.2°F | Resp 16 | Ht 71.0 in | Wt 164.2 lb

## 2023-08-24 DIAGNOSIS — Z5112 Encounter for antineoplastic immunotherapy: Secondary | ICD-10-CM

## 2023-08-24 DIAGNOSIS — C7801 Secondary malignant neoplasm of right lung: Secondary | ICD-10-CM | POA: Diagnosis not present

## 2023-08-24 DIAGNOSIS — C7800 Secondary malignant neoplasm of unspecified lung: Secondary | ICD-10-CM

## 2023-08-24 DIAGNOSIS — Z923 Personal history of irradiation: Secondary | ICD-10-CM | POA: Diagnosis not present

## 2023-08-24 DIAGNOSIS — Z79620 Long term (current) use of immunosuppressive biologic: Secondary | ICD-10-CM | POA: Diagnosis not present

## 2023-08-24 DIAGNOSIS — C439 Malignant melanoma of skin, unspecified: Secondary | ICD-10-CM | POA: Insufficient documentation

## 2023-08-24 DIAGNOSIS — J90 Pleural effusion, not elsewhere classified: Secondary | ICD-10-CM | POA: Diagnosis not present

## 2023-08-24 DIAGNOSIS — Z95828 Presence of other vascular implants and grafts: Secondary | ICD-10-CM

## 2023-08-24 DIAGNOSIS — Z8042 Family history of malignant neoplasm of prostate: Secondary | ICD-10-CM | POA: Insufficient documentation

## 2023-08-24 LAB — CBC WITH DIFFERENTIAL (CANCER CENTER ONLY)
Abs Immature Granulocytes: 0.02 10*3/uL (ref 0.00–0.07)
Basophils Absolute: 0 10*3/uL (ref 0.0–0.1)
Basophils Relative: 1 %
Eosinophils Absolute: 0.2 10*3/uL (ref 0.0–0.5)
Eosinophils Relative: 4 %
HCT: 28.9 % — ABNORMAL LOW (ref 39.0–52.0)
Hemoglobin: 10.3 g/dL — ABNORMAL LOW (ref 13.0–17.0)
Immature Granulocytes: 1 %
Lymphocytes Relative: 29 %
Lymphs Abs: 1.2 10*3/uL (ref 0.7–4.0)
MCH: 34.4 pg — ABNORMAL HIGH (ref 26.0–34.0)
MCHC: 35.6 g/dL (ref 30.0–36.0)
MCV: 96.7 fL (ref 80.0–100.0)
Monocytes Absolute: 0.5 10*3/uL (ref 0.1–1.0)
Monocytes Relative: 12 %
Neutro Abs: 2.2 10*3/uL (ref 1.7–7.7)
Neutrophils Relative %: 53 %
Platelet Count: 186 10*3/uL (ref 150–400)
RBC: 2.99 MIL/uL — ABNORMAL LOW (ref 4.22–5.81)
RDW: 11.9 % (ref 11.5–15.5)
WBC Count: 4 10*3/uL (ref 4.0–10.5)
nRBC: 0 % (ref 0.0–0.2)

## 2023-08-24 LAB — CMP (CANCER CENTER ONLY)
ALT: 14 U/L (ref 0–44)
AST: 15 U/L (ref 15–41)
Albumin: 4 g/dL (ref 3.5–5.0)
Alkaline Phosphatase: 40 U/L (ref 38–126)
Anion gap: 6 (ref 5–15)
BUN: 19 mg/dL (ref 8–23)
CO2: 26 mmol/L (ref 22–32)
Calcium: 9.5 mg/dL (ref 8.9–10.3)
Chloride: 100 mmol/L (ref 98–111)
Creatinine: 1.05 mg/dL (ref 0.61–1.24)
GFR, Estimated: >60 mL/min (ref >60)
Glucose, Bld: 240 mg/dL — ABNORMAL HIGH (ref 70–99)
Potassium: 4.1 mmol/L (ref 3.5–5.1)
Sodium: 132 mmol/L — ABNORMAL LOW (ref 135–145)
Total Bilirubin: 0.5 mg/dL (ref 0.3–1.2)
Total Protein: 6.8 g/dL (ref 6.5–8.1)

## 2023-08-24 LAB — TSH: TSH: 4.448 u[IU]/mL (ref 0.350–4.500)

## 2023-08-24 MED ORDER — SODIUM CHLORIDE 0.9 % IV SOLN
480.0000 mg | Freq: Once | INTRAVENOUS | Status: AC
  Administered 2023-08-24: 480 mg via INTRAVENOUS
  Filled 2023-08-24: qty 48

## 2023-08-24 MED ORDER — SODIUM CHLORIDE 0.9% FLUSH
10.0000 mL | INTRAVENOUS | Status: DC | PRN
  Administered 2023-08-24: 10 mL

## 2023-08-24 MED ORDER — SODIUM CHLORIDE 0.9 % IV SOLN: Freq: Once | INTRAVENOUS | Status: AC

## 2023-08-24 MED ORDER — SODIUM CHLORIDE 0.9% FLUSH
10.0000 mL | Freq: Once | INTRAVENOUS | Status: AC
  Administered 2023-08-24: 10 mL

## 2023-08-24 MED ORDER — HEPARIN SOD (PORK) LOCK FLUSH 100 UNIT/ML IV SOLN
500.0000 [IU] | Freq: Once | INTRAVENOUS | Status: AC | PRN
  Administered 2023-08-24: 500 [IU]

## 2023-08-25 LAB — T4: T4, Total: 6.4 ug/dL (ref 4.5–12.0)
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
## 2023-08-26 ENCOUNTER — Other Ambulatory Visit: Payer: Self-pay

## 2023-09-01 ENCOUNTER — Ambulatory Visit: Payer: Medicare Other | Admitting: Cardiovascular Disease

## 2023-09-02 ENCOUNTER — Ambulatory Visit: Payer: Medicare Other | Attending: Cardiovascular Disease | Admitting: Cardiovascular Disease

## 2023-09-02 ENCOUNTER — Encounter: Payer: Self-pay | Admitting: Cardiovascular Disease

## 2023-09-02 VITALS — BP 118/68 | HR 78 | Ht 71.0 in | Wt 165.0 lb

## 2023-09-02 DIAGNOSIS — I480 Paroxysmal atrial fibrillation: Secondary | ICD-10-CM | POA: Diagnosis not present

## 2023-09-02 NOTE — Patient Instructions (Signed)
Medication Instructions:  Your physician recommends that you continue on your current medications as directed. Please refer to the Current Medication list given to you today.  *If you need a refill on your cardiac medications before your next appointment, please call your pharmacy*  Lab Work: If you have labs (blood work) drawn today and your tests are completely normal, you will receive your results only by: MyChart Message (if you have MyChart) OR A paper copy in the mail If you have any lab test that is abnormal or we need to change your treatment, we will call you to review the results.  Follow-Up: At [facility], you and your health needs are our priority.  As part of our continuing mission to provide you with exceptional heart care, we have created designated Provider Care Teams.  These Care Teams include your primary Cardiologist (physician) and Advanced Practice Providers (APPs -  Physician Assistants and Nurse Practitioners) who all work together to provide you with the care you need, when you need it.  We recommend signing up for the patient portal called "MyChart".  Sign up information is provided on this After Visit Summary.  MyChart is used to connect with patients for Virtual Visits (Telemedicine).  Patients are able to view lab/test results, encounter notes, upcoming appointments, etc.  Non-urgent messages can be sent to your provider as well.   To learn more about what you can do with MyChart, go to [email or web address].    Your next appointment:   12 month(s)  Provider:   Charlton Haws, MD

## 2023-09-09 IMAGING — CT CT ABD-PELV W/ CM
2 of 5 series · 12 of 36 positions shown, 15 images · IV contrast (agent unspecified)
Comparison: 02/04/2022 chest CT angiogram.  11/04/2021 PET-CT.

CLINICAL DATA: Malignant melanoma metastatic to lung.  Restaging.

* Tracking Code: BO *
EXAM:
CT CHEST, ABDOMEN, AND PELVIS WITH CONTRAST
TECHNIQUE: Multidetector CT imaging of the chest, abdomen and pelvis was
performed following the standard protocol during bolus
administration of intravenous contrast.

[Series 3: cap with · axial · 0.85mm/px · z∈[-447,+93]mm · 9 of 134 slices shown, 12 images]
[im 13/134  mediastinal]
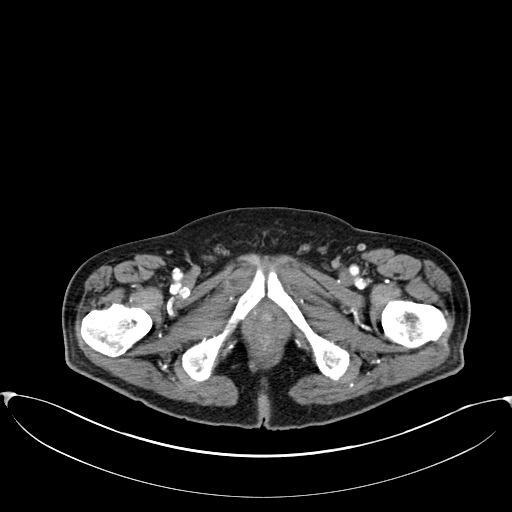
[im 13/134  lung]
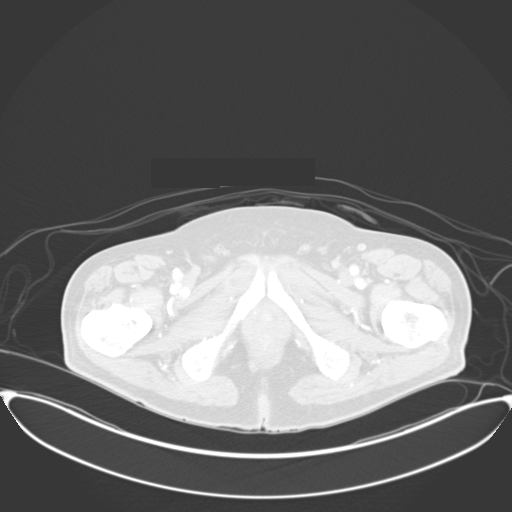
[im 25/134  lung]
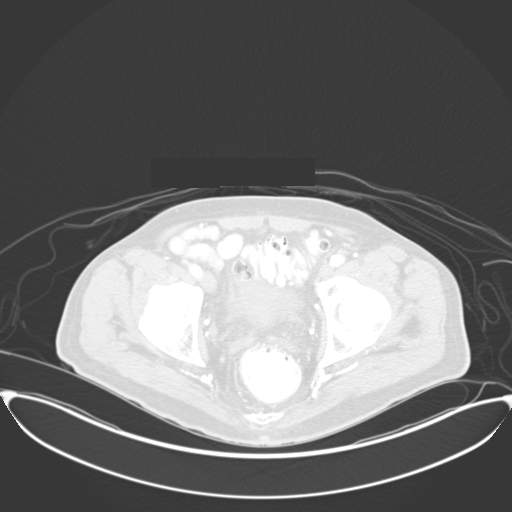
[im 37/134  lung]
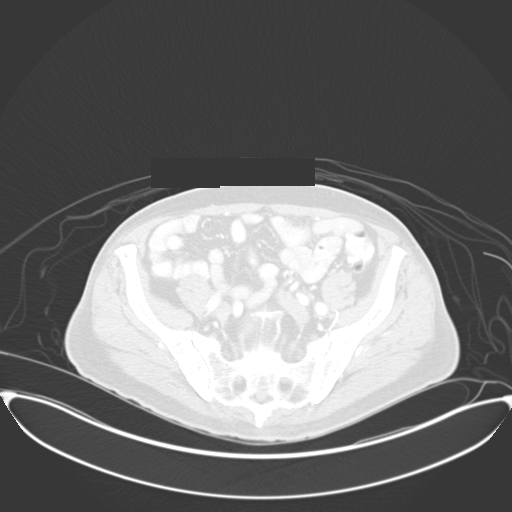
[im 49/134  lung]
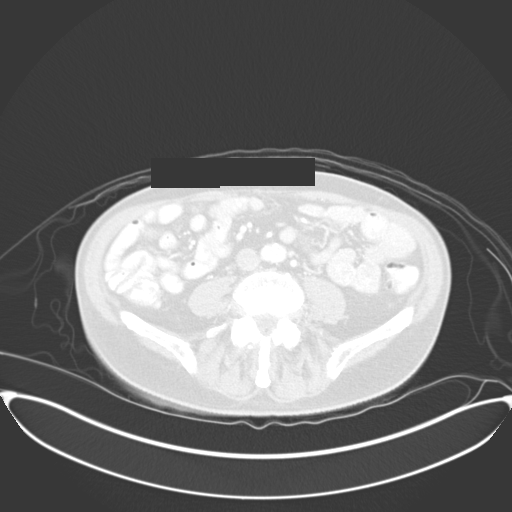
[im 73/134  mediastinal]
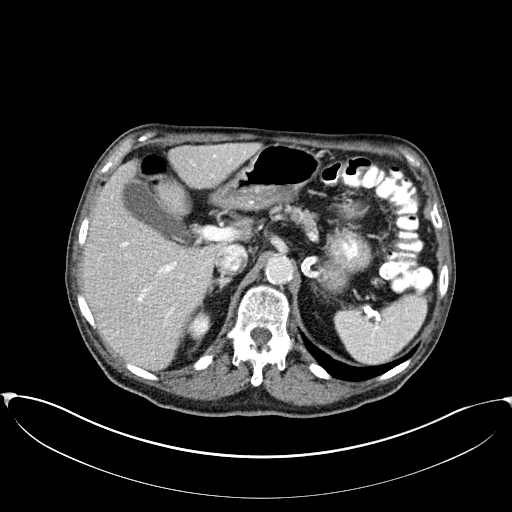
[im 73/134  lung]
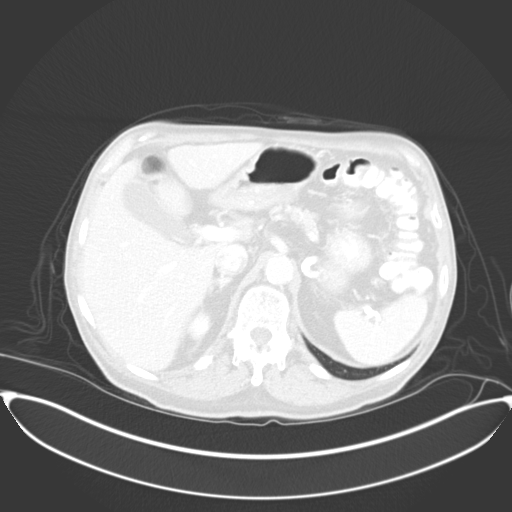
[im 85/134  lung]
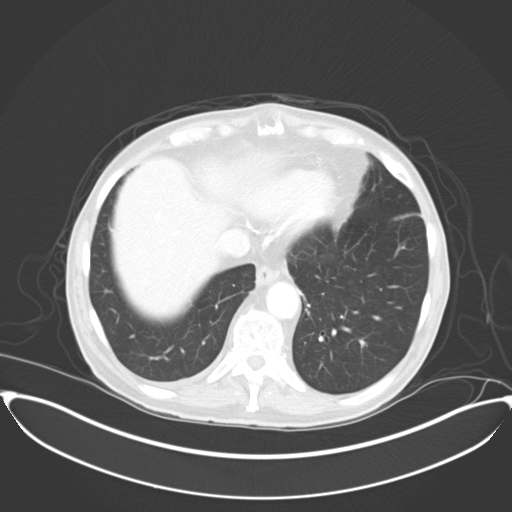
[im 97/134  lung]
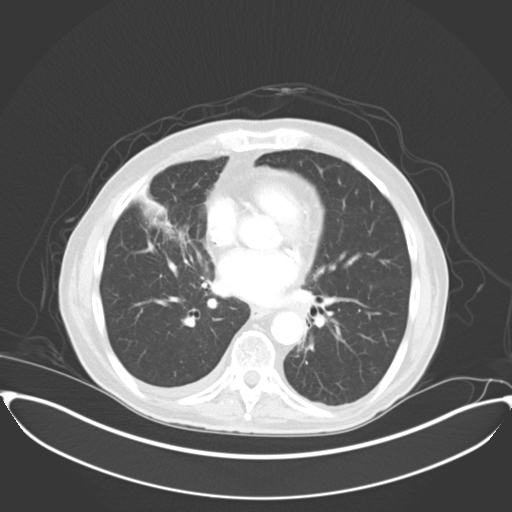
[im 109/134  lung]
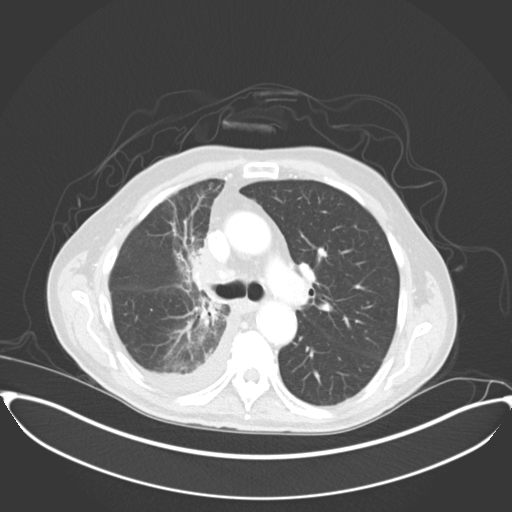
[im 121/134  mediastinal]
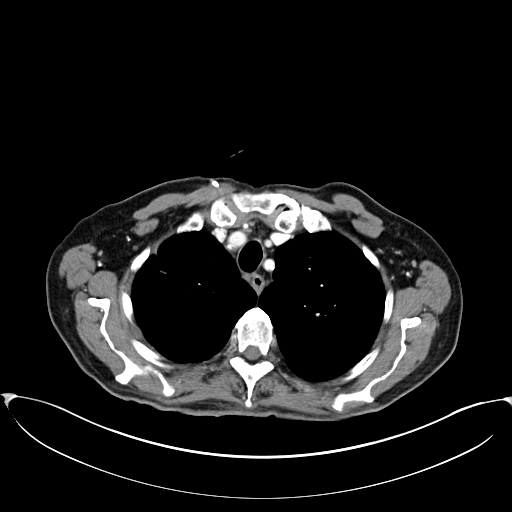
[im 121/134  lung]
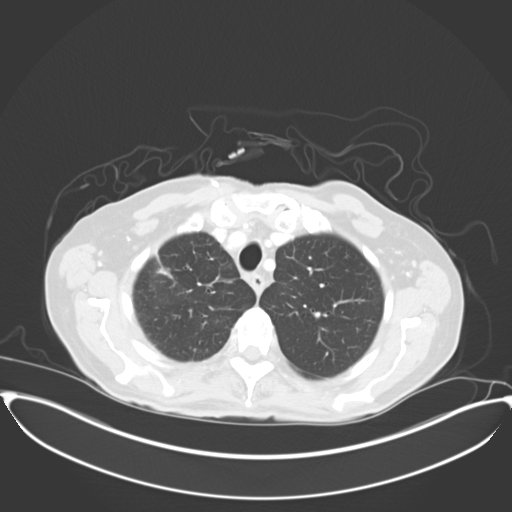

[Series 4: coronals · coronal · 0.75mm/px · 3 of 145 slices shown]
[im 29/145  lung]
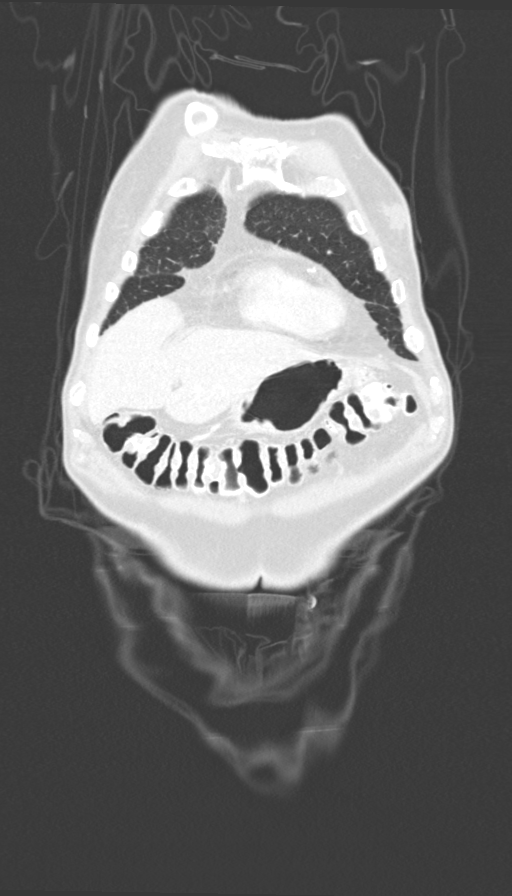
[im 58/145  lung]
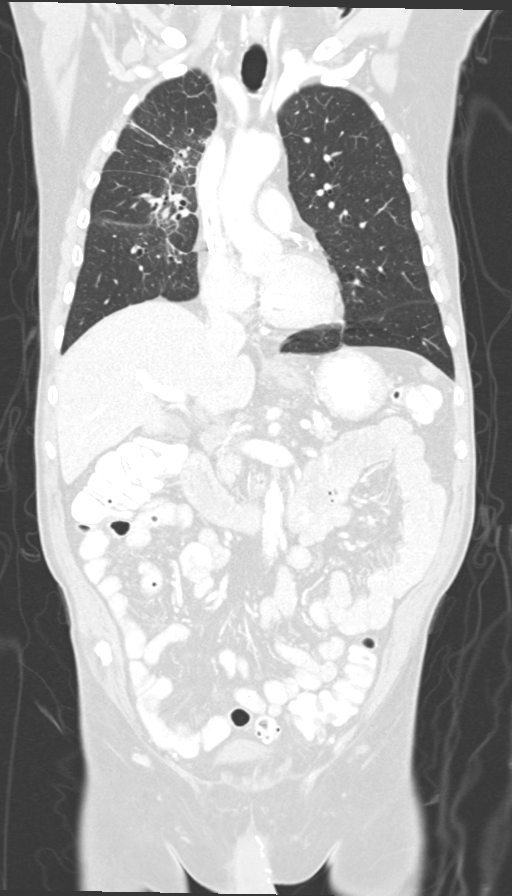
[im 87/145  lung]
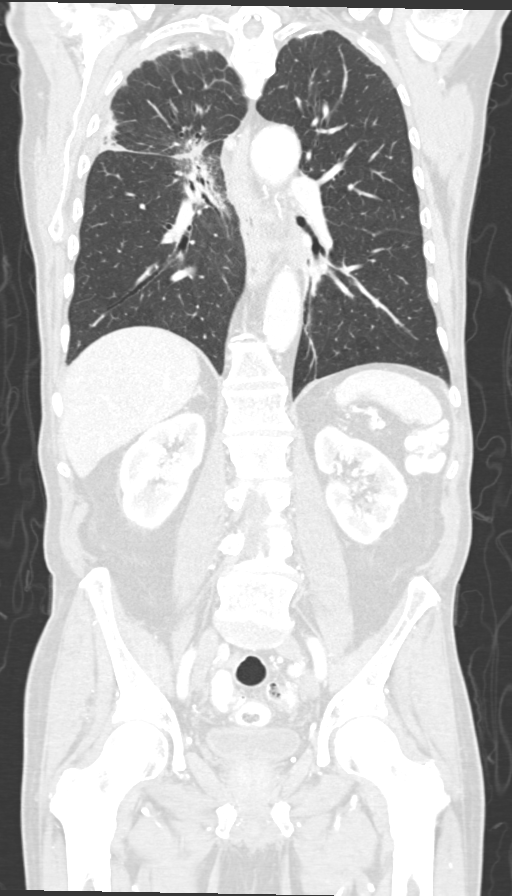

[12 of 36 positions shown; findings below may reference images not displayed]

RADIATION DOSE REDUCTION: This exam was performed according to the
departmental dose-optimization program which includes automated
exposure control, adjustment of the mA and/or kV according to
patient size and/or use of iterative reconstruction technique.

CONTRAST:  100mL OMNIPAQUE IOHEXOL 300 MG/ML  SOLN
FINDINGS: CT CHEST FINDINGS

Cardiovascular: Normal heart size. No significant pericardial
effusion/thickening. Three-vessel coronary atherosclerosis. Right
internal jugular Port-A-Cath terminates at the cavoatrial junction.
Atherosclerotic nonaneurysmal thoracic aorta. Normal caliber
pulmonary arteries. No central pulmonary emboli.

Mediastinum/Nodes: No discrete thyroid nodules. Unremarkable
esophagus. No axillary adenopathy. No residual pathologically
enlarged mediastinal nodes. Right perihilar 2.8 x 1.8 cm mass
(series 511/image 26), significantly decreased from 5.9 x 3.7 cm on
01/15/2022 CT. Resolved left infrahilar adenopathy.

Lungs/Pleura: No pneumothorax. Small dependent right pleural
effusion, minimally increased. No left pleural effusion. Solid 3.4 x
2.8 cm right middle lobe lung mass (series 5, image 16/image 89),
significantly decreased from 5.6 x 4.4 cm. Anterior right upper lobe
2.9 x 2.2 cm solid pulmonary nodule (series 516/image 55), decreased
from 3.3 x 2.5 cm. Two tiny solid left lower lobe pulmonary nodules
are stable, largest 0.3 cm (series 515/image 370). Sharply
marginated patchy consolidation and ground-glass opacity in
perihilar right lung, decreased, compatible with evolving
postradiation change. No new significant pulmonary nodules.

Musculoskeletal: No aggressive appearing focal osseous lesions. Mild
thoracic spondylosis.

CT ABDOMEN PELVIS FINDINGS

Hepatobiliary: Normal liver with no liver mass. Normal gallbladder
with no radiopaque cholelithiasis. No biliary ductal dilatation.

Pancreas: Normal, with no mass or duct dilation.

Spleen: Normal size. No mass.

Adrenals/Urinary Tract: Normal adrenals. Normal kidneys with no
hydronephrosis and no renal mass. Normal bladder.

Stomach/Bowel: Normal non-distended stomach. Normal caliber small
bowel with no small bowel wall thickening. Normal appendix. Oral
contrast transits to the rectum. Moderate sigmoid diverticulosis,
with no large bowel wall thickening or significant pericolonic fat
stranding.

Vascular/Lymphatic: Atherosclerotic nonaneurysmal abdominal aorta.
Patent portal, splenic, hepatic and renal veins. No pathologically
enlarged lymph nodes in the abdomen or pelvis.

Reproductive: Normal size prostate.

Other: No pneumoperitoneum, ascites or focal fluid collection.

Musculoskeletal: No aggressive appearing focal osseous lesions. Mild
lumbar spondylosis.
IMPRESSION: 1. Significant interval partial treatment response. Right perihilar
and right pulmonary metastases are significantly decreased in size.
Resolved left infrahilar adenopathy. No new or progressive
metastatic disease in the chest.
2. No evidence of metastatic disease in the abdomen or pelvis.
3. Small dependent right pleural effusion, minimally increased.
4. Chronic findings include: Moderate sigmoid diverticulosis.
Three-vessel coronary atherosclerosis. Aortic Atherosclerosis
([id number]).

## 2023-09-20 ENCOUNTER — Inpatient Hospital Stay: Payer: Medicare Other | Attending: Internal Medicine

## 2023-09-20 ENCOUNTER — Inpatient Hospital Stay (HOSPITAL_BASED_OUTPATIENT_CLINIC_OR_DEPARTMENT_OTHER): Payer: Medicare Other | Admitting: Internal Medicine

## 2023-09-20 ENCOUNTER — Inpatient Hospital Stay: Payer: Medicare Other

## 2023-09-20 DIAGNOSIS — C7800 Secondary malignant neoplasm of unspecified lung: Secondary | ICD-10-CM

## 2023-09-20 DIAGNOSIS — Z79620 Long term (current) use of immunosuppressive biologic: Secondary | ICD-10-CM | POA: Insufficient documentation

## 2023-09-20 DIAGNOSIS — D649 Anemia, unspecified: Secondary | ICD-10-CM | POA: Diagnosis not present

## 2023-09-20 DIAGNOSIS — Z5112 Encounter for antineoplastic immunotherapy: Secondary | ICD-10-CM | POA: Diagnosis present

## 2023-09-20 DIAGNOSIS — C7801 Secondary malignant neoplasm of right lung: Secondary | ICD-10-CM | POA: Insufficient documentation

## 2023-09-20 DIAGNOSIS — C439 Malignant melanoma of skin, unspecified: Secondary | ICD-10-CM | POA: Insufficient documentation

## 2023-09-20 LAB — TSH: TSH: 3.047 u[IU]/mL (ref 0.350–4.500)

## 2023-09-20 LAB — CMP (CANCER CENTER ONLY)
ALT: 14 U/L (ref 0–44)
AST: 14 U/L — ABNORMAL LOW (ref 15–41)
Albumin: 4.1 g/dL (ref 3.5–5.0)
Alkaline Phosphatase: 42 U/L (ref 38–126)
Anion gap: 8 (ref 5–15)
BUN: 21 mg/dL (ref 8–23)
CO2: 23 mmol/L (ref 22–32)
Calcium: 9.8 mg/dL (ref 8.9–10.3)
Chloride: 105 mmol/L (ref 98–111)
Creatinine: 1.15 mg/dL (ref 0.61–1.24)
GFR, Estimated: >60 mL/min (ref >60)
Glucose, Bld: 171 mg/dL — ABNORMAL HIGH (ref 70–99)
Potassium: 4.3 mmol/L (ref 3.5–5.1)
Sodium: 136 mmol/L (ref 135–145)
Total Bilirubin: 0.5 mg/dL (ref 0.3–1.2)
Total Protein: 7.1 g/dL (ref 6.5–8.1)

## 2023-09-20 LAB — CBC WITH DIFFERENTIAL (CANCER CENTER ONLY)
Abs Immature Granulocytes: 0.02 10*3/uL (ref 0.00–0.07)
Basophils Absolute: 0 10*3/uL (ref 0.0–0.1)
Basophils Relative: 1 %
Eosinophils Absolute: 0.2 10*3/uL (ref 0.0–0.5)
Eosinophils Relative: 4 %
HCT: 29.9 % — ABNORMAL LOW (ref 39.0–52.0)
Hemoglobin: 10.7 g/dL — ABNORMAL LOW (ref 13.0–17.0)
Immature Granulocytes: 0 %
Lymphocytes Relative: 24 %
Lymphs Abs: 1.1 10*3/uL (ref 0.7–4.0)
MCH: 34.7 pg — ABNORMAL HIGH (ref 26.0–34.0)
MCHC: 35.8 g/dL (ref 30.0–36.0)
MCV: 97.1 fL (ref 80.0–100.0)
Monocytes Absolute: 0.5 10*3/uL (ref 0.1–1.0)
Monocytes Relative: 11 %
Neutro Abs: 2.7 10*3/uL (ref 1.7–7.7)
Neutrophils Relative %: 60 %
Platelet Count: 187 10*3/uL (ref 150–400)
RBC: 3.08 MIL/uL — ABNORMAL LOW (ref 4.22–5.81)
RDW: 11.9 % (ref 11.5–15.5)
WBC Count: 4.6 10*3/uL (ref 4.0–10.5)
nRBC: 0 % (ref 0.0–0.2)

## 2023-09-20 MED ORDER — SODIUM CHLORIDE 0.9 % IV SOLN: Freq: Once | INTRAVENOUS | Status: AC

## 2023-09-20 MED ORDER — SODIUM CHLORIDE 0.9% FLUSH
10.0000 mL | INTRAVENOUS | Status: DC | PRN
  Administered 2023-09-20: 10 mL

## 2023-09-20 MED ORDER — SODIUM CHLORIDE 0.9 % IV SOLN
480.0000 mg | Freq: Once | INTRAVENOUS | Status: AC
  Administered 2023-09-20: 480 mg via INTRAVENOUS
  Filled 2023-09-20: qty 48

## 2023-09-20 MED ORDER — HEPARIN SOD (PORK) LOCK FLUSH 100 UNIT/ML IV SOLN
500.0000 [IU] | Freq: Once | INTRAVENOUS | Status: AC | PRN
  Administered 2023-09-20: 500 [IU]
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
## 2023-09-20 NOTE — Patient Instructions (Signed)
[facility] [facility]  Discharge Instructions: Thank you for choosing [facility] to provide your oncology and hematology care.   If you have a lab appointment with the Cancer Center, please go directly to the Cancer Center and check in at the registration area.   Wear comfortable clothing and clothing appropriate for easy access to any Portacath or PICC line.   We strive to give you quality time with your provider. You may need to reschedule your appointment if you arrive late (15 or more minutes).  Arriving late affects you and other patients whose appointments are after yours.  Also, if you miss three or more appointments without notifying the office, you may be dismissed from the clinic at the provider's discretion.      For prescription refill requests, have your pharmacy contact our office and allow 72 hours for refills to be completed.    Today you received the following chemotherapy and/or immunotherapy agents: Opdiovo.      To help prevent nausea and vomiting after your treatment, we encourage you to take your nausea medication as directed.  BELOW ARE SYMPTOMS THAT SHOULD BE REPORTED IMMEDIATELY: *FEVER GREATER THAN 100.4 F (38 C) OR HIGHER *CHILLS OR SWEATING *NAUSEA AND VOMITING THAT IS NOT CONTROLLED WITH YOUR NAUSEA MEDICATION *UNUSUAL SHORTNESS OF BREATH *UNUSUAL BRUISING OR BLEEDING *URINARY PROBLEMS (pain or burning when urinating, or frequent urination) *BOWEL PROBLEMS (unusual diarrhea, constipation, pain near the anus) TENDERNESS IN MOUTH AND THROAT WITH OR WITHOUT PRESENCE OF ULCERS (sore throat, sores in mouth, or a toothache) UNUSUAL RASH, SWELLING OR PAIN  UNUSUAL VAGINAL DISCHARGE OR ITCHING   Items with * indicate a potential emergency and should be followed up as soon as possible or go to the Emergency Department if any problems should occur.  Please show the CHEMOTHERAPY ALERT CARD or IMMUNOTHERAPY ALERT CARD at  check-in to the Emergency Department and triage nurse.  Should you have questions after your visit or need to cancel or reschedule your appointment, please contact [facility] [facility]  Dept: [phone]  and follow the prompts.  Office hours are 8:00 a.m. to 4:30 p.m. Monday - Friday. Please note that voicemails left after 4:00 p.m. may not be returned until the following business day.  We are closed weekends and major holidays. You have access to a nurse at all times for urgent questions. Please call the main number to the clinic Dept: [phone] and follow the prompts.   For any non-urgent questions, you may also contact your provider using MyChart. We now offer e-Visits for anyone 34 and older to request care online for non-urgent symptoms. For details visit [email or web address].   Also download the MyChart app! Go to the app store, search "MyChart", open the app, select [place], and log in with your MyChart username and password.

## 2023-09-21 LAB — T4: T4, Total: 6.2 ug/dL (ref 4.5–12.0)

## 2023-09-24 ENCOUNTER — Other Ambulatory Visit: Payer: Self-pay | Admitting: Cardiovascular Disease

## 2023-09-24 DIAGNOSIS — I480 Paroxysmal atrial fibrillation: Secondary | ICD-10-CM

## 2023-09-25 ENCOUNTER — Other Ambulatory Visit: Payer: Self-pay

## 2023-09-26 NOTE — Telephone Encounter (Signed)
Prescription refill request for Eliquis received. Indication:afib Last office visit:9/24 Scr:1.15  10/24 Age: 77 Weight:75.5  kg  Prescription refilled

## 2023-10-05 ENCOUNTER — Other Ambulatory Visit: Payer: Self-pay
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
## 2023-10-17 ENCOUNTER — Encounter: Payer: Self-pay | Admitting: Physician Assistant

## 2023-10-17 ENCOUNTER — Encounter: Payer: Self-pay | Admitting: Internal Medicine

## 2023-10-19 ENCOUNTER — Inpatient Hospital Stay: Payer: Medicare Other

## 2023-10-19 ENCOUNTER — Inpatient Hospital Stay: Payer: Medicare Other | Attending: Internal Medicine

## 2023-10-19 ENCOUNTER — Inpatient Hospital Stay: Payer: Medicare Other | Admitting: Physician Assistant

## 2023-10-19 VITALS — BP 145/75 | HR 80 | Temp 98.2°F | Resp 18 | Ht 71.0 in | Wt 165.1 lb

## 2023-10-19 DIAGNOSIS — Z79620 Long term (current) use of immunosuppressive biologic: Secondary | ICD-10-CM | POA: Insufficient documentation

## 2023-10-19 DIAGNOSIS — C439 Malignant melanoma of skin, unspecified: Secondary | ICD-10-CM | POA: Insufficient documentation

## 2023-10-19 DIAGNOSIS — C7800 Secondary malignant neoplasm of unspecified lung: Secondary | ICD-10-CM

## 2023-10-19 DIAGNOSIS — C7801 Secondary malignant neoplasm of right lung: Secondary | ICD-10-CM | POA: Insufficient documentation

## 2023-10-19 DIAGNOSIS — Z95828 Presence of other vascular implants and grafts: Secondary | ICD-10-CM

## 2023-10-19 DIAGNOSIS — Z923 Personal history of irradiation: Secondary | ICD-10-CM | POA: Insufficient documentation

## 2023-10-19 DIAGNOSIS — Z5112 Encounter for antineoplastic immunotherapy: Secondary | ICD-10-CM | POA: Insufficient documentation

## 2023-10-19 LAB — CMP (CANCER CENTER ONLY)
ALT: 18 U/L (ref 0–44)
AST: 17 U/L (ref 15–41)
Albumin: 3.9 g/dL (ref 3.5–5.0)
Alkaline Phosphatase: 47 U/L (ref 38–126)
Anion gap: 6 (ref 5–15)
BUN: 20 mg/dL (ref 8–23)
CO2: 25 mmol/L (ref 22–32)
Calcium: 9.4 mg/dL (ref 8.9–10.3)
Chloride: 104 mmol/L (ref 98–111)
Creatinine: 0.96 mg/dL (ref 0.61–1.24)
GFR, Estimated: >60 mL/min (ref >60)
Glucose, Bld: 228 mg/dL — ABNORMAL HIGH (ref 70–99)
Potassium: 4 mmol/L (ref 3.5–5.1)
Sodium: 135 mmol/L (ref 135–145)
Total Bilirubin: 0.5 mg/dL (ref <1.2)
Total Protein: 6.9 g/dL (ref 6.5–8.1)

## 2023-10-19 LAB — CBC WITH DIFFERENTIAL (CANCER CENTER ONLY)
Abs Immature Granulocytes: 0.01 10*3/uL (ref 0.00–0.07)
Basophils Absolute: 0 10*3/uL (ref 0.0–0.1)
Basophils Relative: 1 %
Eosinophils Absolute: 0.2 10*3/uL (ref 0.0–0.5)
Eosinophils Relative: 6 %
HCT: 30 % — ABNORMAL LOW (ref 39.0–52.0)
Hemoglobin: 10 g/dL — ABNORMAL LOW (ref 13.0–17.0)
Immature Granulocytes: 0 %
Lymphocytes Relative: 24 %
Lymphs Abs: 0.9 10*3/uL (ref 0.7–4.0)
MCH: 32.8 pg (ref 26.0–34.0)
MCHC: 33.3 g/dL (ref 30.0–36.0)
MCV: 98.4 fL (ref 80.0–100.0)
Monocytes Absolute: 0.6 10*3/uL (ref 0.1–1.0)
Monocytes Relative: 14 %
Neutro Abs: 2.2 10*3/uL (ref 1.7–7.7)
Neutrophils Relative %: 55 %
Platelet Count: 203 10*3/uL (ref 150–400)
RBC: 3.05 MIL/uL — ABNORMAL LOW (ref 4.22–5.81)
RDW: 11.9 % (ref 11.5–15.5)
WBC Count: 4 10*3/uL (ref 4.0–10.5)
nRBC: 0 % (ref 0.0–0.2)

## 2023-10-19 LAB — TSH: TSH: 3.219 u[IU]/mL (ref 0.350–4.500)

## 2023-10-19 MED ORDER — HEPARIN SOD (PORK) LOCK FLUSH 100 UNIT/ML IV SOLN
500.0000 [IU] | Freq: Once | INTRAVENOUS | Status: AC | PRN
Start: 2023-10-19 — End: 2023-10-19
  Administered 2023-10-19: 500 [IU]

## 2023-10-19 MED ORDER — SODIUM CHLORIDE 0.9% FLUSH
10.0000 mL | Freq: Once | INTRAVENOUS | Status: AC
  Administered 2023-10-19: 10 mL

## 2023-10-19 MED ORDER — SODIUM CHLORIDE 0.9 % IV SOLN
480.0000 mg | Freq: Once | INTRAVENOUS | Status: AC
  Administered 2023-10-19: 480 mg via INTRAVENOUS
  Filled 2023-10-19: qty 48

## 2023-10-19 MED ORDER — SODIUM CHLORIDE 0.9 % IV SOLN: Freq: Once | INTRAVENOUS | Status: AC

## 2023-10-19 MED ORDER — SODIUM CHLORIDE 0.9% FLUSH
10.0000 mL | INTRAVENOUS | Status: DC | PRN
Start: 2023-10-19 — End: 2023-10-19
  Administered 2023-10-19: 10 mL

## 2023-10-19 NOTE — Patient Instructions (Signed)
[facility] - A DEPT OF [facility]  Discharge Instructions: Thank you for choosing [facility] to provide your oncology and hematology care.   If you have a lab appointment with the Cancer Center, please go directly to the Cancer Center and check in at the registration area.   Wear comfortable clothing and clothing appropriate for easy access to any Portacath or PICC line.   We strive to give you quality time with your provider. You may need to reschedule your appointment if you arrive late (15 or more minutes).  Arriving late affects you and other patients whose appointments are after yours.  Also, if you miss three or more appointments without notifying the office, you may be dismissed from the clinic at the provider's discretion.      For prescription refill requests, have your pharmacy contact our office and allow 72 hours for refills to be completed.    Today you received the following chemotherapy and/or immunotherapy agents: Opdivo.       To help prevent nausea and vomiting after your treatment, we encourage you to take your nausea medication as directed.  BELOW ARE SYMPTOMS THAT SHOULD BE REPORTED IMMEDIATELY: *FEVER GREATER THAN 100.4 F (38 C) OR HIGHER *CHILLS OR SWEATING *NAUSEA AND VOMITING THAT IS NOT CONTROLLED WITH YOUR NAUSEA MEDICATION *UNUSUAL SHORTNESS OF BREATH *UNUSUAL BRUISING OR BLEEDING *URINARY PROBLEMS (pain or burning when urinating, or frequent urination) *BOWEL PROBLEMS (unusual diarrhea, constipation, pain near the anus) TENDERNESS IN MOUTH AND THROAT WITH OR WITHOUT PRESENCE OF ULCERS (sore throat, sores in mouth, or a toothache) UNUSUAL RASH, SWELLING OR PAIN  UNUSUAL VAGINAL DISCHARGE OR ITCHING   Items with * indicate a potential emergency and should be followed up as soon as possible or go to the Emergency Department if any problems should occur.  Please show the CHEMOTHERAPY ALERT CARD or IMMUNOTHERAPY  ALERT CARD at check-in to the Emergency Department and triage nurse.  Should you have questions after your visit or need to cancel or reschedule your appointment, please contact [facility] - A DEPT OF [facility]  Dept: [phone]  and follow the prompts.  Office hours are 8:00 a.m. to 4:30 p.m. Monday - Friday. Please note that voicemails left after 4:00 p.m. may not be returned until the following business day.  We are closed weekends and major holidays. You have access to a nurse at all times for urgent questions. Please call the main number to the clinic Dept: [phone] and follow the prompts.   For any non-urgent questions, you may also contact your provider using MyChart. We now offer e-Visits for anyone 52 and older to request care online for non-urgent symptoms. For details visit [email or web address].   Also download the MyChart app! Go to the app store, search "MyChart", open the app, select [facility], and log in with your MyChart username and password.

## 2023-10-20 LAB — T4: T4, Total: 6.6 ug/dL (ref 4.5–12.0)

## 2023-10-21 ENCOUNTER — Other Ambulatory Visit: Payer: Self-pay

## 2023-10-21 ENCOUNTER — Other Ambulatory Visit: Payer: Self-pay | Admitting: Medical Genetics

## 2023-10-21 DIAGNOSIS — Z006 Encounter for examination for normal comparison and control in clinical research program: Secondary | ICD-10-CM

## 2023-10-27 ENCOUNTER — Other Ambulatory Visit: Payer: Self-pay | Admitting: Cardiovascular Disease
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
## 2023-11-16 ENCOUNTER — Inpatient Hospital Stay: Payer: Medicare Other

## 2023-11-16 ENCOUNTER — Inpatient Hospital Stay: Payer: Medicare Other | Attending: Internal Medicine

## 2023-11-16 ENCOUNTER — Inpatient Hospital Stay: Payer: Medicare Other | Admitting: Physician Assistant

## 2023-11-16 VITALS — BP 130/76 | HR 81 | Temp 97.4°F | Resp 18 | Ht 71.0 in | Wt 168.4 lb

## 2023-11-16 DIAGNOSIS — Z5112 Encounter for antineoplastic immunotherapy: Secondary | ICD-10-CM | POA: Diagnosis present

## 2023-11-16 DIAGNOSIS — Z79620 Long term (current) use of immunosuppressive biologic: Secondary | ICD-10-CM | POA: Diagnosis not present

## 2023-11-16 DIAGNOSIS — C439 Malignant melanoma of skin, unspecified: Secondary | ICD-10-CM | POA: Insufficient documentation

## 2023-11-16 DIAGNOSIS — C7801 Secondary malignant neoplasm of right lung: Secondary | ICD-10-CM | POA: Insufficient documentation

## 2023-11-16 DIAGNOSIS — C7800 Secondary malignant neoplasm of unspecified lung: Secondary | ICD-10-CM

## 2023-11-16 LAB — CBC WITH DIFFERENTIAL (CANCER CENTER ONLY)
Abs Immature Granulocytes: 0.03 10*3/uL (ref 0.00–0.07)
Basophils Absolute: 0 10*3/uL (ref 0.0–0.1)
Basophils Relative: 1 %
Eosinophils Absolute: 0.3 10*3/uL (ref 0.0–0.5)
Eosinophils Relative: 6 %
HCT: 28.8 % — ABNORMAL LOW (ref 39.0–52.0)
Hemoglobin: 9.6 g/dL — ABNORMAL LOW (ref 13.0–17.0)
Immature Granulocytes: 1 %
Lymphocytes Relative: 16 %
Lymphs Abs: 0.9 10*3/uL (ref 0.7–4.0)
MCH: 33.1 pg (ref 26.0–34.0)
MCHC: 33.3 g/dL (ref 30.0–36.0)
MCV: 99.3 fL (ref 80.0–100.0)
Monocytes Absolute: 0.6 10*3/uL (ref 0.1–1.0)
Monocytes Relative: 10 %
Neutro Abs: 3.7 10*3/uL (ref 1.7–7.7)
Neutrophils Relative %: 66 %
Platelet Count: 196 10*3/uL (ref 150–400)
RBC: 2.9 MIL/uL — ABNORMAL LOW (ref 4.22–5.81)
RDW: 11.8 % (ref 11.5–15.5)
WBC Count: 5.6 10*3/uL (ref 4.0–10.5)
nRBC: 0 % (ref 0.0–0.2)

## 2023-11-16 LAB — CMP (CANCER CENTER ONLY)
ALT: 15 U/L (ref 0–44)
AST: 16 U/L (ref 15–41)
Albumin: 3.8 g/dL (ref 3.5–5.0)
Alkaline Phosphatase: 65 U/L (ref 38–126)
Anion gap: 8 (ref 5–15)
BUN: 18 mg/dL (ref 8–23)
CO2: 23 mmol/L (ref 22–32)
Calcium: 9.3 mg/dL (ref 8.9–10.3)
Chloride: 101 mmol/L (ref 98–111)
Creatinine: 1.04 mg/dL (ref 0.61–1.24)
GFR, Estimated: >60 mL/min (ref >60)
Glucose, Bld: 238 mg/dL — ABNORMAL HIGH (ref 70–99)
Potassium: 4.2 mmol/L (ref 3.5–5.1)
Sodium: 132 mmol/L — ABNORMAL LOW (ref 135–145)
Total Bilirubin: 0.4 mg/dL (ref <1.2)
Total Protein: 6.8 g/dL (ref 6.5–8.1)

## 2023-11-16 LAB — TSH: TSH: 4.243 u[IU]/mL (ref 0.350–4.500)

## 2023-11-16 MED ORDER — HEPARIN SOD (PORK) LOCK FLUSH 100 UNIT/ML IV SOLN
500.0000 [IU] | Freq: Once | INTRAVENOUS | Status: AC | PRN
  Administered 2023-11-16: 500 [IU]

## 2023-11-16 MED ORDER — SODIUM CHLORIDE 0.9 % IV SOLN: Freq: Once | INTRAVENOUS | Status: AC

## 2023-11-16 MED ORDER — SODIUM CHLORIDE 0.9% FLUSH
10.0000 mL | INTRAVENOUS | Status: DC | PRN
  Administered 2023-11-16: 10 mL

## 2023-11-16 MED ORDER — NIVOLUMAB CHEMO INJECTION 100 MG/10ML
480.0000 mg | Freq: Once | INTRAVENOUS | Status: AC
  Administered 2023-11-16: 480 mg via INTRAVENOUS
  Filled 2023-11-16: qty 48

## 2023-11-16 NOTE — Patient Instructions (Signed)
[facility] - A DEPT OF [facility]  Discharge Instructions: Thank you for choosing [facility] to provide your oncology and hematology care.   If you have a lab appointment with the Cancer Center, please go directly to the Cancer Center and check in at the registration area.   Wear comfortable clothing and clothing appropriate for easy access to any Portacath or PICC line.   We strive to give you quality time with your provider. You may need to reschedule your appointment if you arrive late (15 or more minutes).  Arriving late affects you and other patients whose appointments are after yours.  Also, if you miss three or more appointments without notifying the office, you may be dismissed from the clinic at the provider's discretion.      For prescription refill requests, have your pharmacy contact our office and allow 72 hours for refills to be completed.    Today you received the following chemotherapy and/or immunotherapy agents opdivo      To help prevent nausea and vomiting after your treatment, we encourage you to take your nausea medication as directed.  BELOW ARE SYMPTOMS THAT SHOULD BE REPORTED IMMEDIATELY: *FEVER GREATER THAN 100.4 F (38 C) OR HIGHER *CHILLS OR SWEATING *NAUSEA AND VOMITING THAT IS NOT CONTROLLED WITH YOUR NAUSEA MEDICATION *UNUSUAL SHORTNESS OF BREATH *UNUSUAL BRUISING OR BLEEDING *URINARY PROBLEMS (pain or burning when urinating, or frequent urination) *BOWEL PROBLEMS (unusual diarrhea, constipation, pain near the anus) TENDERNESS IN MOUTH AND THROAT WITH OR WITHOUT PRESENCE OF ULCERS (sore throat, sores in mouth, or a toothache) UNUSUAL RASH, SWELLING OR PAIN  UNUSUAL VAGINAL DISCHARGE OR ITCHING   Items with * indicate a potential emergency and should be followed up as soon as possible or go to the Emergency Department if any problems should occur.  Please show the CHEMOTHERAPY ALERT CARD or IMMUNOTHERAPY  ALERT CARD at check-in to the Emergency Department and triage nurse.  Should you have questions after your visit or need to cancel or reschedule your appointment, please contact [facility] - A DEPT OF [facility]  Dept: [phone]  and follow the prompts.  Office hours are 8:00 a.m. to 4:30 p.m. Monday - Friday. Please note that voicemails left after 4:00 p.m. may not be returned until the following business day.  We are closed weekends and major holidays. You have access to a nurse at all times for urgent questions. Please call the main number to the clinic Dept: [phone] and follow the prompts.   For any non-urgent questions, you may also contact your provider using MyChart. We now offer e-Visits for anyone 74 and older to request care online for non-urgent symptoms. For details visit [email or web address].   Also download the MyChart app! Go to the app store, search "MyChart", open the app, select [facility], and log in with your MyChart username and password.

## 2023-11-17 LAB — T4: T4, Total: 6.8 ug/dL (ref 4.5–12.0)

## 2023-11-18 ENCOUNTER — Other Ambulatory Visit (HOSPITAL_COMMUNITY)
Admission: AD | Admit: 2023-11-18 | Discharge: 2023-11-18 | Disposition: A | Discharge status: Home / Self Care | Payer: Medicare Other | Source: Ambulatory Visit | Attending: Oncology | Admitting: Oncology

## 2023-11-18 DIAGNOSIS — Z006 Encounter for examination for normal comparison and control in clinical research program: Secondary | ICD-10-CM | POA: Insufficient documentation

## 2023-11-28 LAB — GENECONNECT MOLECULAR SCREEN: Genetic Analysis Overall Interpretation: NEGATIVE
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
## 2023-12-08 ENCOUNTER — Other Ambulatory Visit: Payer: Self-pay | Admitting: Internal Medicine

## 2023-12-13 ENCOUNTER — Inpatient Hospital Stay: Payer: Medicare Other

## 2023-12-13 ENCOUNTER — Inpatient Hospital Stay (HOSPITAL_BASED_OUTPATIENT_CLINIC_OR_DEPARTMENT_OTHER): Payer: Medicare Other | Admitting: Physician Assistant

## 2023-12-13 VITALS — BP 111/59 | HR 61 | Temp 97.6°F | Resp 17 | Ht 71.0 in | Wt 168.6 lb

## 2023-12-13 DIAGNOSIS — C7800 Secondary malignant neoplasm of unspecified lung: Secondary | ICD-10-CM

## 2023-12-13 DIAGNOSIS — Z95828 Presence of other vascular implants and grafts: Secondary | ICD-10-CM

## 2023-12-13 DIAGNOSIS — Z5112 Encounter for antineoplastic immunotherapy: Secondary | ICD-10-CM | POA: Diagnosis not present

## 2023-12-13 LAB — CMP (CANCER CENTER ONLY)
ALT: 15 U/L (ref 0–44)
AST: 14 U/L — ABNORMAL LOW (ref 15–41)
Albumin: 3.8 g/dL (ref 3.5–5.0)
Alkaline Phosphatase: 65 U/L (ref 38–126)
Anion gap: 8 (ref 5–15)
BUN: 19 mg/dL (ref 8–23)
CO2: 23 mmol/L (ref 22–32)
Calcium: 9.1 mg/dL (ref 8.9–10.3)
Chloride: 102 mmol/L (ref 98–111)
Creatinine: 1.15 mg/dL (ref 0.61–1.24)
GFR, Estimated: >60 mL/min (ref >60)
Glucose, Bld: 227 mg/dL — ABNORMAL HIGH (ref 70–99)
Potassium: 4.4 mmol/L (ref 3.5–5.1)
Sodium: 133 mmol/L — ABNORMAL LOW (ref 135–145)
Total Bilirubin: 0.5 mg/dL (ref 0.0–1.2)
Total Protein: 7 g/dL (ref 6.5–8.1)

## 2023-12-13 LAB — CBC WITH DIFFERENTIAL (CANCER CENTER ONLY)
Abs Immature Granulocytes: 0.03 10*3/uL (ref 0.00–0.07)
Basophils Absolute: 0 10*3/uL (ref 0.0–0.1)
Basophils Relative: 1 %
Eosinophils Absolute: 0.4 10*3/uL (ref 0.0–0.5)
Eosinophils Relative: 7 %
HCT: 29.4 % — ABNORMAL LOW (ref 39.0–52.0)
Hemoglobin: 10.1 g/dL — ABNORMAL LOW (ref 13.0–17.0)
Immature Granulocytes: 1 %
Lymphocytes Relative: 24 %
Lymphs Abs: 1.2 10*3/uL (ref 0.7–4.0)
MCH: 33.3 pg (ref 26.0–34.0)
MCHC: 34.4 g/dL (ref 30.0–36.0)
MCV: 97 fL (ref 80.0–100.0)
Monocytes Absolute: 0.6 10*3/uL (ref 0.1–1.0)
Monocytes Relative: 11 %
Neutro Abs: 2.9 10*3/uL (ref 1.7–7.7)
Neutrophils Relative %: 56 %
Platelet Count: 200 10*3/uL (ref 150–400)
RBC: 3.03 MIL/uL — ABNORMAL LOW (ref 4.22–5.81)
RDW: 12 % (ref 11.5–15.5)
WBC Count: 5.1 10*3/uL (ref 4.0–10.5)
nRBC: 0 % (ref 0.0–0.2)

## 2023-12-13 LAB — TSH: TSH: 4.576 u[IU]/mL — ABNORMAL HIGH (ref 0.350–4.500)

## 2023-12-13 MED ORDER — NIVOLUMAB CHEMO INJECTION 100 MG/10ML
480.0000 mg | Freq: Once | INTRAVENOUS | Status: AC
  Administered 2023-12-13: 480 mg via INTRAVENOUS
  Filled 2023-12-13: qty 48

## 2023-12-13 MED ORDER — SODIUM CHLORIDE 0.9% FLUSH
10.0000 mL | INTRAVENOUS | Status: DC | PRN
Start: 2023-12-13 — End: 2023-12-13
  Administered 2023-12-13: 10 mL

## 2023-12-13 MED ORDER — SODIUM CHLORIDE 0.9 % IV SOLN: Freq: Once | INTRAVENOUS | Status: AC

## 2023-12-13 MED ORDER — HEPARIN SOD (PORK) LOCK FLUSH 100 UNIT/ML IV SOLN
500.0000 [IU] | Freq: Once | INTRAVENOUS | Status: AC | PRN
  Administered 2023-12-13: 500 [IU]

## 2023-12-13 MED ORDER — SODIUM CHLORIDE 0.9% FLUSH
10.0000 mL | Freq: Once | INTRAVENOUS | Status: AC
  Administered 2023-12-13: 10 mL

## 2023-12-13 NOTE — Patient Instructions (Signed)
 [facility] - A DEPT OF [facility]  Discharge Instructions: Thank you for choosing [facility] to provide your oncology and hematology care.   If you have a lab appointment with the Cancer Center, please go directly to the Cancer Center and check in at the registration area.   Wear comfortable clothing and clothing appropriate for easy access to any Portacath or PICC line.   We strive to give you quality time with your provider. You may need to reschedule your appointment if you arrive late (15 or more minutes).  Arriving late affects you and other patients whose appointments are after yours.  Also, if you miss three or more appointments without notifying the office, you may be dismissed from the clinic at the provider's discretion.      For prescription refill requests, have your pharmacy contact our office and allow 72 hours for refills to be completed.    Today you received the following chemotherapy and/or immunotherapy agents opdivo      To help prevent nausea and vomiting after your treatment, we encourage you to take your nausea medication as directed.  BELOW ARE SYMPTOMS THAT SHOULD BE REPORTED IMMEDIATELY: *FEVER GREATER THAN 100.4 F (38 C) OR HIGHER *CHILLS OR SWEATING *NAUSEA AND VOMITING THAT IS NOT CONTROLLED WITH YOUR NAUSEA MEDICATION *UNUSUAL SHORTNESS OF BREATH *UNUSUAL BRUISING OR BLEEDING *URINARY PROBLEMS (pain or burning when urinating, or frequent urination) *BOWEL PROBLEMS (unusual diarrhea, constipation, pain near the anus) TENDERNESS IN MOUTH AND THROAT WITH OR WITHOUT PRESENCE OF ULCERS (sore throat, sores in mouth, or a toothache) UNUSUAL RASH, SWELLING OR PAIN  UNUSUAL VAGINAL DISCHARGE OR ITCHING   Items with * indicate a potential emergency and should be followed up as soon as possible or go to the Emergency Department if any problems should occur.  Please show the CHEMOTHERAPY ALERT CARD or IMMUNOTHERAPY  ALERT CARD at check-in to the Emergency Department and triage nurse.  Should you have questions after your visit or need to cancel or reschedule your appointment, please contact [facility] - A DEPT OF [facility]  Dept: [phone]  and follow the prompts.  Office hours are 8:00 a.m. to 4:30 p.m. Monday - Friday. Please note that voicemails left after 4:00 p.m. may not be returned until the following business day.  We are closed weekends and major holidays. You have access to a nurse at all times for urgent questions. Please call the main number to the clinic Dept: [phone] and follow the prompts.   For any non-urgent questions, you may also contact your provider using MyChart. We now offer e-Visits for anyone 74 and older to request care online for non-urgent symptoms. For details visit [email or web address].   Also download the MyChart app! Go to the app store, search "MyChart", open the app, select [facility], and log in with your MyChart username and password.

## 2023-12-14 ENCOUNTER — Other Ambulatory Visit: Payer: Self-pay

## 2023-12-14 LAB — T4: T4, Total: 6 ug/dL (ref 4.5–12.0)

## 2024-01-02 ENCOUNTER — Encounter (HOSPITAL_COMMUNITY)
Admission: RE | Admit: 2024-01-02 | Discharge: 2024-01-02 | Disposition: A | Discharge status: Home / Self Care | Payer: Medicare Other | Source: Ambulatory Visit | Attending: Physician Assistant | Admitting: Physician Assistant

## 2024-01-02 DIAGNOSIS — C7800 Secondary malignant neoplasm of unspecified lung: Secondary | ICD-10-CM | POA: Diagnosis present

## 2024-01-02 LAB — GLUCOSE, CAPILLARY: Glucose-Capillary: 233 mg/dL — ABNORMAL HIGH (ref 70–99)

## 2024-01-02 MED ORDER — FLUDEOXYGLUCOSE F - 18 (FDG) INJECTION
8.0000 | Freq: Once | INTRAVENOUS | Status: AC | PRN
  Administered 2024-01-02: 8.12 via INTRAVENOUS

## 2024-01-10 ENCOUNTER — Inpatient Hospital Stay: Payer: Medicare Other | Attending: Internal Medicine

## 2024-01-10 ENCOUNTER — Inpatient Hospital Stay: Payer: Medicare Other | Admitting: Internal Medicine

## 2024-01-10 VITALS — BP 135/62 | HR 84 | Temp 97.6°F | Resp 15 | Ht 71.0 in | Wt 165.5 lb

## 2024-01-10 DIAGNOSIS — M25552 Pain in left hip: Secondary | ICD-10-CM | POA: Diagnosis not present

## 2024-01-10 DIAGNOSIS — Z79899 Other long term (current) drug therapy: Secondary | ICD-10-CM | POA: Insufficient documentation

## 2024-01-10 DIAGNOSIS — C7800 Secondary malignant neoplasm of unspecified lung: Secondary | ICD-10-CM | POA: Diagnosis not present

## 2024-01-10 DIAGNOSIS — R918 Other nonspecific abnormal finding of lung field: Secondary | ICD-10-CM | POA: Insufficient documentation

## 2024-01-10 DIAGNOSIS — M25562 Pain in left knee: Secondary | ICD-10-CM | POA: Insufficient documentation

## 2024-01-10 DIAGNOSIS — E1140 Type 2 diabetes mellitus with diabetic neuropathy, unspecified: Secondary | ICD-10-CM | POA: Diagnosis not present

## 2024-01-10 DIAGNOSIS — Z95828 Presence of other vascular implants and grafts: Secondary | ICD-10-CM

## 2024-01-10 DIAGNOSIS — Z7984 Long term (current) use of oral hypoglycemic drugs: Secondary | ICD-10-CM | POA: Insufficient documentation

## 2024-01-10 DIAGNOSIS — C439 Malignant melanoma of skin, unspecified: Secondary | ICD-10-CM | POA: Insufficient documentation

## 2024-01-10 LAB — CMP (CANCER CENTER ONLY)
ALT: 13 U/L (ref 0–44)
AST: 14 U/L — ABNORMAL LOW (ref 15–41)
Albumin: 4 g/dL (ref 3.5–5.0)
Alkaline Phosphatase: 48 U/L (ref 38–126)
Anion gap: 10 (ref 5–15)
BUN: 26 mg/dL — ABNORMAL HIGH (ref 8–23)
CO2: 22 mmol/L (ref 22–32)
Calcium: 9.4 mg/dL (ref 8.9–10.3)
Chloride: 103 mmol/L (ref 98–111)
Creatinine: 1.35 mg/dL — ABNORMAL HIGH (ref 0.61–1.24)
GFR, Estimated: 53 mL/min — ABNORMAL LOW (ref >60)
Glucose, Bld: 267 mg/dL — ABNORMAL HIGH (ref 70–99)
Potassium: 4.2 mmol/L (ref 3.5–5.1)
Sodium: 135 mmol/L (ref 135–145)
Total Bilirubin: 0.3 mg/dL (ref 0.0–1.2)
Total Protein: 7 g/dL (ref 6.5–8.1)

## 2024-01-10 LAB — CBC WITH DIFFERENTIAL (CANCER CENTER ONLY)
Abs Immature Granulocytes: 0.02 10*3/uL (ref 0.00–0.07)
Basophils Absolute: 0 10*3/uL (ref 0.0–0.1)
Basophils Relative: 1 %
Eosinophils Absolute: 0.3 10*3/uL (ref 0.0–0.5)
Eosinophils Relative: 7 %
HCT: 30.3 % — ABNORMAL LOW (ref 39.0–52.0)
Hemoglobin: 10.1 g/dL — ABNORMAL LOW (ref 13.0–17.0)
Immature Granulocytes: 0 %
Lymphocytes Relative: 22 %
Lymphs Abs: 1 10*3/uL (ref 0.7–4.0)
MCH: 32.8 pg (ref 26.0–34.0)
MCHC: 33.3 g/dL (ref 30.0–36.0)
MCV: 98.4 fL (ref 80.0–100.0)
Monocytes Absolute: 0.5 10*3/uL (ref 0.1–1.0)
Monocytes Relative: 10 %
Neutro Abs: 2.7 10*3/uL (ref 1.7–7.7)
Neutrophils Relative %: 60 %
Platelet Count: 216 10*3/uL (ref 150–400)
RBC: 3.08 MIL/uL — ABNORMAL LOW (ref 4.22–5.81)
RDW: 12.7 % (ref 11.5–15.5)
WBC Count: 4.5 10*3/uL (ref 4.0–10.5)
nRBC: 0 % (ref 0.0–0.2)

## 2024-01-10 LAB — TSH: TSH: 4.273 u[IU]/mL (ref 0.350–4.500)

## 2024-01-10 MED ORDER — SODIUM CHLORIDE 0.9% FLUSH
10.0000 mL | Freq: Once | INTRAVENOUS | Status: AC
  Administered 2024-01-10: 10 mL

## 2024-01-10 MED ORDER — HEPARIN SOD (PORK) LOCK FLUSH 100 UNIT/ML IV SOLN
500.0000 [IU] | Freq: Once | INTRAVENOUS | Status: AC
  Administered 2024-01-10: 500 [IU]
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
## 2024-01-11 ENCOUNTER — Other Ambulatory Visit: Payer: Self-pay

## 2024-01-11 LAB — T4: T4, Total: 6.5 ug/dL (ref 4.5–12.0)

## 2024-02-08 ENCOUNTER — Encounter: Payer: Self-pay | Admitting: Emergency Medicine

## 2024-03-06 ENCOUNTER — Inpatient Hospital Stay: Payer: Medicare Other | Attending: Internal Medicine

## 2024-03-06 DIAGNOSIS — C439 Malignant melanoma of skin, unspecified: Secondary | ICD-10-CM | POA: Insufficient documentation

## 2024-03-06 DIAGNOSIS — Z95828 Presence of other vascular implants and grafts: Secondary | ICD-10-CM

## 2024-03-06 DIAGNOSIS — C7801 Secondary malignant neoplasm of right lung: Secondary | ICD-10-CM | POA: Insufficient documentation

## 2024-03-06 MED ORDER — HEPARIN SOD (PORK) LOCK FLUSH 100 UNIT/ML IV SOLN
500.0000 [IU] | Freq: Once | INTRAVENOUS | Status: AC
Start: 2024-03-06 — End: 2024-03-06
  Administered 2024-03-06: 500 [IU]

## 2024-03-06 MED ORDER — SODIUM CHLORIDE 0.9% FLUSH
10.0000 mL | Freq: Once | INTRAVENOUS | Status: AC
  Administered 2024-03-06: 10 mL

## 2024-05-08 ENCOUNTER — Inpatient Hospital Stay: Payer: Medicare Other | Attending: Internal Medicine

## 2024-05-08 VITALS — BP 117/77 | HR 64 | Temp 97.8°F | Resp 16

## 2024-05-08 DIAGNOSIS — C7801 Secondary malignant neoplasm of right lung: Secondary | ICD-10-CM | POA: Insufficient documentation

## 2024-05-08 DIAGNOSIS — Z95828 Presence of other vascular implants and grafts: Secondary | ICD-10-CM

## 2024-05-08 DIAGNOSIS — C439 Malignant melanoma of skin, unspecified: Secondary | ICD-10-CM | POA: Diagnosis present

## 2024-05-08 MED ORDER — SODIUM CHLORIDE 0.9% FLUSH
10.0000 mL | Freq: Once | INTRAVENOUS | Status: AC
  Administered 2024-05-08: 10 mL

## 2024-05-08 MED ORDER — HEPARIN SOD (PORK) LOCK FLUSH 100 UNIT/ML IV SOLN
500.0000 [IU] | Freq: Once | INTRAVENOUS | Status: AC
Start: 2024-05-08 — End: 2024-05-08
  Administered 2024-05-08: 500 [IU]

## 2024-06-12 ENCOUNTER — Other Ambulatory Visit: Payer: Self-pay

## 2024-07-02 ENCOUNTER — Inpatient Hospital Stay: Payer: Medicare Other

## 2024-07-09 ENCOUNTER — Other Ambulatory Visit: Payer: Self-pay | Admitting: Internal Medicine

## 2024-07-09 ENCOUNTER — Inpatient Hospital Stay: Attending: Internal Medicine

## 2024-07-09 ENCOUNTER — Ambulatory Visit (HOSPITAL_COMMUNITY)
Admission: RE | Admit: 2024-07-09 | Discharge: 2024-07-09 | Disposition: A | Discharge status: Home / Self Care | Source: Ambulatory Visit | Attending: Internal Medicine | Admitting: Internal Medicine

## 2024-07-09 DIAGNOSIS — C7800 Secondary malignant neoplasm of unspecified lung: Secondary | ICD-10-CM | POA: Insufficient documentation

## 2024-07-09 DIAGNOSIS — I2510 Atherosclerotic heart disease of native coronary artery without angina pectoris: Secondary | ICD-10-CM | POA: Diagnosis not present

## 2024-07-09 DIAGNOSIS — I700 Atherosclerosis of aorta: Secondary | ICD-10-CM | POA: Insufficient documentation

## 2024-07-09 DIAGNOSIS — K760 Fatty (change of) liver, not elsewhere classified: Secondary | ICD-10-CM | POA: Insufficient documentation

## 2024-07-09 DIAGNOSIS — C439 Malignant melanoma of skin, unspecified: Secondary | ICD-10-CM | POA: Diagnosis present

## 2024-07-09 DIAGNOSIS — R918 Other nonspecific abnormal finding of lung field: Secondary | ICD-10-CM | POA: Diagnosis not present

## 2024-07-09 DIAGNOSIS — J984 Other disorders of lung: Secondary | ICD-10-CM | POA: Diagnosis not present

## 2024-07-09 DIAGNOSIS — Z95828 Presence of other vascular implants and grafts: Secondary | ICD-10-CM

## 2024-07-09 LAB — CBC WITH DIFFERENTIAL (CANCER CENTER ONLY)
Abs Immature Granulocytes: 0.02 K/uL (ref 0.00–0.07)
Basophils Absolute: 0 K/uL (ref 0.0–0.1)
Basophils Relative: 1 %
Eosinophils Absolute: 0.2 K/uL (ref 0.0–0.5)
Eosinophils Relative: 4 %
HCT: 30.7 % — ABNORMAL LOW (ref 39.0–52.0)
Hemoglobin: 10.8 g/dL — ABNORMAL LOW (ref 13.0–17.0)
Immature Granulocytes: 1 %
Lymphocytes Relative: 35 %
Lymphs Abs: 1.5 K/uL (ref 0.7–4.0)
MCH: 33.8 pg (ref 26.0–34.0)
MCHC: 35.2 g/dL (ref 30.0–36.0)
MCV: 95.9 fL (ref 80.0–100.0)
Monocytes Absolute: 0.6 K/uL (ref 0.1–1.0)
Monocytes Relative: 14 %
Neutro Abs: 2 K/uL (ref 1.7–7.7)
Neutrophils Relative %: 45 %
Platelet Count: 164 K/uL (ref 150–400)
RBC: 3.2 MIL/uL — ABNORMAL LOW (ref 4.22–5.81)
RDW: 12.2 % (ref 11.5–15.5)
WBC Count: 4.3 K/uL (ref 4.0–10.5)
nRBC: 0 % (ref 0.0–0.2)

## 2024-07-09 LAB — CMP (CANCER CENTER ONLY)
ALT: 13 U/L (ref 0–44)
AST: 15 U/L (ref 15–41)
Albumin: 4 g/dL (ref 3.5–5.0)
Alkaline Phosphatase: 43 U/L (ref 38–126)
Anion gap: 6 (ref 5–15)
BUN: 16 mg/dL (ref 8–23)
CO2: 26 mmol/L (ref 22–32)
Calcium: 9.2 mg/dL (ref 8.9–10.3)
Chloride: 105 mmol/L (ref 98–111)
Creatinine: 1.09 mg/dL (ref 0.61–1.24)
GFR, Estimated: >60 mL/min (ref >60)
Glucose, Bld: 180 mg/dL — ABNORMAL HIGH (ref 70–99)
Potassium: 4.2 mmol/L (ref 3.5–5.1)
Sodium: 137 mmol/L (ref 135–145)
Total Bilirubin: 0.6 mg/dL (ref 0.0–1.2)
Total Protein: 7.1 g/dL (ref 6.5–8.1)

## 2024-07-09 LAB — LACTATE DEHYDROGENASE: LDH: 109 U/L (ref 98–192)

## 2024-07-09 MED ORDER — IOHEXOL 300 MG/ML  SOLN
100.0000 mL | Freq: Once | INTRAMUSCULAR | Status: AC | PRN
  Administered 2024-07-09: 100 mL via INTRAVENOUS

## 2024-07-09 MED ORDER — HEPARIN SOD (PORK) LOCK FLUSH 100 UNIT/ML IV SOLN
INTRAVENOUS | Status: AC
  Filled 2024-07-09: qty 5

## 2024-07-09 MED ORDER — HEPARIN SOD (PORK) LOCK FLUSH 100 UNIT/ML IV SOLN
500.0000 [IU] | Freq: Once | INTRAVENOUS | Status: AC
  Administered 2024-07-09: 500 [IU] via INTRAVENOUS

## 2024-07-09 MED ORDER — SODIUM CHLORIDE 0.9% FLUSH
10.0000 mL | Freq: Once | INTRAVENOUS | Status: AC
  Administered 2024-07-09: 10 mL

## 2024-07-10 ENCOUNTER — Inpatient Hospital Stay: Payer: Medicare Other | Admitting: Internal Medicine

## 2024-07-16 ENCOUNTER — Inpatient Hospital Stay: Attending: Internal Medicine | Admitting: Internal Medicine

## 2024-07-16 VITALS — BP 138/77 | HR 78 | Temp 97.3°F | Resp 17 | Ht 71.0 in | Wt 169.0 lb

## 2024-07-16 DIAGNOSIS — C7800 Secondary malignant neoplasm of unspecified lung: Secondary | ICD-10-CM

## 2024-07-16 DIAGNOSIS — Z923 Personal history of irradiation: Secondary | ICD-10-CM | POA: Insufficient documentation

## 2024-07-16 DIAGNOSIS — C7801 Secondary malignant neoplasm of right lung: Secondary | ICD-10-CM | POA: Diagnosis not present

## 2024-07-16 DIAGNOSIS — Z9221 Personal history of antineoplastic chemotherapy: Secondary | ICD-10-CM | POA: Insufficient documentation

## 2024-07-16 DIAGNOSIS — C439 Malignant melanoma of skin, unspecified: Secondary | ICD-10-CM | POA: Insufficient documentation
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
## 2024-07-17 ENCOUNTER — Telehealth: Payer: Self-pay | Admitting: Internal Medicine

## 2024-07-17 NOTE — Telephone Encounter (Signed)
 Scheduled appointments with the patient and his wife. They are aware of the changes made.

## 2024-07-18 ENCOUNTER — Other Ambulatory Visit: Payer: Self-pay

## 2024-07-31 ENCOUNTER — Other Ambulatory Visit: Payer: Self-pay | Admitting: Physician Assistant

## 2024-08-14 ENCOUNTER — Telehealth: Payer: Self-pay | Admitting: Internal Medicine

## 2024-08-19 NOTE — Consults (Signed)
 ------------------------------------------------------------------------------- Attestation signed by Lynwood Vicenta Buff, MD at 08/20/2024  9:49 AM Attending Attestation::  I supervised the Advanced Practitioner on 08/19/2024.? I performed, in its entirety, the assessment and plan of the visit.  I agree with the Advanced Practitioner's note -------------------------------------------------------------------------------  Consultation - Cardiology  Name: Sid Greener 78 y.o. male I MRN: [id number] Unit/Bed#: ICU 25 I Date of Admission: 08/18/2024  Date of Service: 08/19/2024 I Hospital Day: 1   Physician Requesting Evaluation: Selinda Rush Clarity, MD  Reason for Evaluation / Principal Problem: Atrial fibrillation/ SSS  Assessment & Plan Chronic atrial fibrillation (HCC) Asymptomatic, adequate rate control without beta-blockade therapy.  Continue telemetry. Will monitor for tachy versus bradycardia episodes.  At this time there is no indication for permanent pacemaker implantation.  Continue apixaban  therapy per neurology's recommendations.  Will order EKG,echocardiogram. CVA (cerebral vascular accident) (HCC) Acute lacunar infarct per neurology Malignant melanoma metastatic to lung [facility]) Per medicine team Type 2 diabetes mellitus (HCC) Lab Results  Component Value Date   HGBA1C 9.3 (H) 08/18/2024    Recent Labs    08/18/24 1716 08/18/24 1948 08/19/24 0738 08/19/24 1106  POCGLU 153* 202* 198* 259*    Blood Sugar Average: Last 72 hrs: (P) [id number]  Pure hypercholesterolemia    History of Present Illness [name] [name] is a 78 y.o. male who presents with sick sinus syndrome with recent CVA.  [name] [name] is a pleasant 78 year old gentleman who is visiting from [place]  who presented to [facility] with symptoms suggestive of CVA with right face and arm numbness .  Patient has been past medical history remarkable for atrial fibrillation with possible sick  sinus syndrome .  He notes he was initially diagnosed approximately 4 years ago with atrial fibrillation.  He was triaged to rate with beta-blocker therapy  (lop) and apixaban  for thromboembolic prophylaxis.  Patient denies associated palpitations lightheadedness dizziness or chest pain while in atrial fibrillation.  He denies prior history of syncope or collapse. Patient was seen by neurology who thought patient suffered from acute lacunar infarct in light of the diffuse intracranial arthrosclerosis on a recent CTA head and neck.  His MRI of the brain is currently pending.  Per neurology it is felt patient would benefit from continuation of apixaban  therapy.  He is currently resting comfortably without chest pain palpitations lightheaded or dizziness.  Review of Systems  Constitutional: Negative.   HENT: Negative.    Eyes: Negative.   Respiratory: Negative.  Negative for cough, chest tightness and shortness of breath.   Cardiovascular:  Negative for chest pain, palpitations and leg swelling.  Gastrointestinal: Negative.   Endocrine: Negative.   Genitourinary: Negative.   Musculoskeletal: Negative.   Skin: Negative.     Objective: Temp:  [97.1 F (36.2 C)-98 F (36.7 C)] 97.3 F (36.3 C) HR:  [52-94] 60 BP: (101-169)/(60-123) 119/72 Resp:  [5-18] 14 SpO2:  [94 %-99 %] 96 % O2 Device: None (Room air) Orthostatic Blood Pressures    Flowsheet Row Most Recent Value  Blood Pressure 119/72 filed at 08/19/2024 1200  Patient Position - Orthostatic VS Sitting filed at 08/19/2024 1200      First Weight: Weight - Scale: 90.7 kg (200 lb) (08/18/24 0023) Vitals:   08/18/24 0023 08/18/24 0624  Weight: 90.7 kg (200 lb) 76.5 kg (168 lb 10.4 oz)    Physical Exam HEENT: Benign, neck supple without adenopathy trachea midline no bruit. Cardiac: Irregular rate and rhythm normal S1-S2 distant Pulmonary: Clear to auscultation and percussion decreased bases  right > left Abdomen: Soft nontender   extremities: Warm without clubbing cyanosis or edema   Lab Results: I have reviewed the following results: Results from last 7 days  Lab Units 08/19/24 0445 08/18/24 0539 08/18/24 0018  WBC Thousand/uL 5.40 5.76 5.87  HEMOGLOBIN g/dL 88.3* 89.5* 87.3  HEMATOCRIT % 35.3* 31.7* 38.0  PLATELETS Thousands/uL 170 172 197   Results from last 7 days  Lab Units 08/19/24 0445 08/18/24 0539 08/18/24 0018  POTASSIUM mmol/L 4.1 4.1 4.3  CHLORIDE mmol/L 103 101 100  CO2 mmol/L 19* 21* 21*  BUN mg/dL 14 13 16   CREATININE mg/dL 8.94 8.90 8.90  CALCIUM  mg/dL 9.4 9.1 89.6*   Results from last 7 days  Lab Units 08/18/24 0018  INR  1.13  PTT seconds 30   Lab Results  Component Value Date   HGBA1C 9.3 (H) 08/18/2024   No results found for: CKTOTAL, CKMB, CKMBINDEX, TROPONINI     VTE Prophylaxis: Apixaban  therapy

## 2024-08-19 NOTE — Assessment & Plan Note (Signed)
 Lab Results  Component Value Date   HGBA1C 9.3 (H) 08/18/2024    Recent Labs    08/18/24 1716 08/18/24 1948 08/19/24 0738 08/19/24 1106  POCGLU 153* 202* 198* 259*    Blood Sugar Average: Last 72 hrs: (P) [id number]

## 2024-08-19 NOTE — Assessment & Plan Note (Signed)
 Acute lacunar infarct per neurology

## 2024-08-19 NOTE — Assessment & Plan Note (Signed)
 Asymptomatic, adequate rate control without beta-blockade therapy.  Continue telemetry. Will monitor for tachy versus bradycardia episodes.  At this time there is no indication for permanent pacemaker implantation.  Continue apixaban  therapy per neurology's recommendations.  Will order EKG,echocardiogram.

## 2024-08-20 NOTE — Progress Notes (Signed)
 Progress Note - Cardiology  Name: [name] [name] 78 y.o. male I MRN: [id number] Unit/Bed#: ICU 25 I Date of Admission: 08/18/2024  Date of Service: 08/20/2024 I Hospital Day: 2  Assessment & Plan Chronic atrial fibrillation (HCC) Asymptomatic, adequate rate control without beta-blockade therapy.  Continue telemetry. Will monitor for tachy versus bradycardia episodes.  At this time there is no indication for permanent pacemaker implantation.  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 Denies any acute overnight events.   Objective: Temp:  [96.9 F (36.1 C)-97.4 F (36.3 C)] 97.4 F (36.3 C) HR:  [60-79] 70 BP: (94-147)/(59-105) 147/105 Resp:  [3-21] 17 SpO2:  [95 %-96 %] 96 % O2 Device: None (Room air) Orthostatic Blood Pressures    Flowsheet Row Most Recent Value  Blood Pressure 147/105* filed at 08/20/2024 1200  Patient Position - Orthostatic VS Lying filed at 08/20/2024 0400      First Weight: Weight - Scale: 90.7 kg (200 lb) (08/18/24 0023) Vitals:   08/18/24 0624 08/20/24 0828  Weight: 76.5 kg (168 lb 10.4 oz) 76 kg (167 lb 8.8 oz)   Physical  Exam Vitals reviewed.  Constitutional:      General: He is not in acute distress.    Appearance: He is not toxic-appearing.  HENT:     Head: Normocephalic and atraumatic.  Eyes:     Pupils: Pupils are equal, round, and reactive to light.  Cardiovascular:     Rate and Rhythm: Normal rate and regular rhythm.     Heart sounds: No murmur heard. Pulmonary:     Effort: Pulmonary effort is normal. No respiratory distress.     Breath sounds: Normal breath sounds.  Abdominal:     Palpations: Abdomen is soft.     Tenderness: There is no abdominal tenderness.  Musculoskeletal:     Right lower leg: No edema.     Left lower leg: No edema.  Skin:    General: Skin is warm and dry.  Neurological:     General: No focal deficit present.     Mental Status: He is alert.  Psychiatric:        Mood and Affect: Mood normal.        Behavior: Behavior normal.     Lab Results: I have reviewed the following results:CBC/BMP:  .    08/20/24 0514  WBC 5.28  HGB 11.1*  HCT 33.7*  PLT 175  SODIUM 137  K 4.1  CL 104  CO2 20*  BUN 17  CREATININE 1.04  GLUC 190*    Results from last 7 days  Lab Units 08/20/24 0514 08/19/24 0445 08/18/24 0539  WBC Thousand/uL 5.28 5.40 5.76  HEMOGLOBIN g/dL 88.8* 88.3* 89.5*  HEMATOCRIT % 33.7* 35.3* 31.7*  PLATELETS Thousands/uL 175 170 172   Results from  last 7 days  Lab Units 08/20/24 0514 08/19/24 0445 08/18/24 0539  POTASSIUM mmol/L 4.1 4.1 4.1  CHLORIDE mmol/L 104 103 101  CO2 mmol/L 20* 19* 21*  BUN mg/dL 17 14 13   CREATININE mg/dL 8.95 8.94 8.90  CALCIUM  mg/dL 9.0 9.4 9.1   Results from last 7 days  Lab Units 08/18/24 0018  INR  1.13  PTT seconds 30   Lab Results  Component Value Date   HGBA1C 9.3 (H) 08/18/2024   No results found for: CKTOTAL, CKMB, CKMBINDEX, TROPONINI  Imaging Results Review: No pertinent imaging studies reviewed. Other Study Results Review: EKG was reviewed.   VTE Pharmacologic Prophylaxis: VTE  covered by: apixaban , Oral, 5 mg at 08/20/24 9167    *Some images could not be shown.

## 2024-08-20 NOTE — Assessment & Plan Note (Signed)
Per medicine team

## 2024-08-21 ENCOUNTER — Inpatient Hospital Stay

## 2024-08-21 NOTE — Telephone Encounter (Addendum)
 1ST ATTEMPT,   Called pt no answer, LMOM.  Thank you,   [name]    NO CONSENT ON FILE    HFU/ [facility] / CVA  DC- 08/20/2024- HOME  ----- Message from Isaiah Gentry, PA-C sent at 08/21/2024 10:18 AM EDT ----- Regarding: HFU [name] [name] will need follow-up in 6-8 weeks with neurovascular team for Small vessel lacunar infarct in 60 minute appointment. They will not require outpatient neurological testing.

## 2024-08-22 NOTE — Progress Notes (Deleted)
 Cardiology Office Note    Patient Name: [name] [name] Date of Encounter: 08/22/2024  Primary Care Provider:  [name], [name] W, MD Primary Cardiologist:  [name] Emmer, MD Primary Electrophysiologist: None   Past Medical History    Past Medical History:  Diagnosis Date   Anemia    CHF (congestive heart failure) (HCC)    Complication of anesthesia    slow to awaken  x1   Dyspnea    Family hx of prostate cancer    IN BROTHER   History of echocardiogram    Echo 5/18: EF 55-60, normal diastolic function, PASP 32   History of nuclear stress test    Myoview  5/18: EF 60, inf defect suspicious for artifact, no ischemia, Low Risk   Hx of basal cell carcinoma    FOLLOWED BY DERMATOLOGIST DR. GOODRICH   Hyperlipidemia    PAF (paroxysmal atrial fibrillation) (HCC)    DIAGNOSED 5/18   Pneumonia    Type 2 diabetes mellitus (HCC)     History of Present Illness  [name] [name] is a 78 y.o. male with a PMH of paroxysmal AF, stage IV metastatic melanoma (palliative XRT), DM type II, HLD acute lacunar CVA, SSS who presents today for posthospital follow-up.  [name] [name] was followed initially by Dr. Maranda and is currently followed by Dr. Emmer for management of atrial fibrillation.  He was last seen in our office on 09/02/2023 and endorsed no new cardiac complaints at that time.  He denied any episodes of rapid AF and was being followed by Dr. Gatha and Dr. Brenna for management of metastatic melanoma of the lungs.    He was admitted to the [facility] in [place]  on 08/18/2024 with right face and arm numbness while watching TV.  TNK was not recommended by neurology.  He admitted to missing doses of his Eliquis  and underwent MRI stroke workup that showed a left thalamic lacunar infarct with no hemorrhage.  He also completed CTA of head and neck and was given Eliquis  along with baby aspirin .  He completed a 2D echo that showed    Patient denies chest pain, palpitations, dyspnea,  PND, orthopnea, nausea, vomiting, dizziness, syncope, edema, weight gain, or early satiety.   Discussed the use of AI scribe software for clinical note transcription with the patient, who gave verbal consent to proceed.  History of Present Illness    ***Notes:   Review of Systems  Please see the history of present illness.    All other systems reviewed and are otherwise negative except as noted above.  Physical Exam    Wt Readings from Last 3 Encounters:  07/16/24 169 lb (76.7 kg)  01/10/24 165 lb 8 oz (75.1 kg)  12/13/23 168 lb 9.6 oz (76.5 kg)   CD:Uyzmz were no vitals filed for this visit.,There is no height or weight on file to calculate BMI. GEN: Well nourished, well developed in no acute distress Neck: No JVD; No carotid bruits Pulmonary: Clear to auscultation without rales, wheezing or rhonchi  Cardiovascular: Normal rate. Regular rhythm. Normal S1. Normal S2.   Murmurs: There is no murmur.  ABDOMEN: Soft, non-tender, non-distended EXTREMITIES:  No edema; No deformity   EKG/LABS/ Recent Cardiac Studies   ECG personally reviewed by me today - ***  Risk Assessment/Calculations:   {Does this patient have ATRIAL FIBRILLATION?:[phone]}      Lab Results  Component Value Date   WBC 4.3 07/09/2024   HGB 10.8 (L) 07/09/2024   HCT 30.7 (L) 07/09/2024  MCV 95.9 07/09/2024   PLT 164 07/09/2024   Lab Results  Component Value Date   CREATININE 1.09 07/09/2024   BUN 16 07/09/2024   NA 137 07/09/2024   K 4.2 07/09/2024   CL 105 07/09/2024   CO2 26 07/09/2024   No results found for: CHOL, HDL, LDLCALC, LDLDIRECT, TRIG, CHOLHDL  Lab Results  Component Value Date   HGBA1C 7.3 (H) 01/15/2022   Assessment & Plan    Assessment and Plan Assessment & Plan     1.  History of acute CVA  2.  Persistent AF  3.  Hyperlipidemia  4.  Metastatic lung CA fd    Disposition: Follow-up with [name] Emmer, MD or APP in *** months {Are you ordering a CV  Procedure (e.g. stress test, cath, DCCV, TEE, etc)?   Press F2        :[id number]}   Signed, [name] [name], [name] Shove, NP 08/22/2024, 12:52 PM [facility]

## 2024-08-23 ENCOUNTER — Ambulatory Visit: Attending: Nurse Practitioner | Admitting: Nurse Practitioner

## 2024-08-23 DIAGNOSIS — I639 Cerebral infarction, unspecified: Secondary | ICD-10-CM

## 2024-08-23 DIAGNOSIS — E782 Mixed hyperlipidemia: Secondary | ICD-10-CM

## 2024-08-23 DIAGNOSIS — C7800 Secondary malignant neoplasm of unspecified lung: Secondary | ICD-10-CM

## 2024-08-23 DIAGNOSIS — I4819 Other persistent atrial fibrillation: Secondary | ICD-10-CM

## 2024-08-24 ENCOUNTER — Ambulatory Visit: Attending: Internal Medicine

## 2024-09-03 ENCOUNTER — Ambulatory Visit: Attending: Emergency Medicine | Admitting: Emergency Medicine

## 2024-09-03 VITALS — BP 135/70 | HR 77 | Ht 71.0 in | Wt 163.0 lb

## 2024-09-03 DIAGNOSIS — E785 Hyperlipidemia, unspecified: Secondary | ICD-10-CM

## 2024-09-03 DIAGNOSIS — I700 Atherosclerosis of aorta: Secondary | ICD-10-CM

## 2024-09-03 DIAGNOSIS — I4819 Other persistent atrial fibrillation: Secondary | ICD-10-CM | POA: Diagnosis not present

## 2024-09-03 DIAGNOSIS — C7800 Secondary malignant neoplasm of unspecified lung: Secondary | ICD-10-CM | POA: Diagnosis not present

## 2024-09-03 DIAGNOSIS — E782 Mixed hyperlipidemia: Secondary | ICD-10-CM

## 2024-09-03 DIAGNOSIS — E119 Type 2 diabetes mellitus without complications: Secondary | ICD-10-CM

## 2024-09-03 DIAGNOSIS — I480 Paroxysmal atrial fibrillation: Secondary | ICD-10-CM

## 2024-09-03 DIAGNOSIS — I2510 Atherosclerotic heart disease of native coronary artery without angina pectoris: Secondary | ICD-10-CM

## 2024-09-03 DIAGNOSIS — I6381 Other cerebral infarction due to occlusion or stenosis of small artery: Secondary | ICD-10-CM | POA: Diagnosis not present

## 2024-09-03 DIAGNOSIS — Z794 Long term (current) use of insulin: Secondary | ICD-10-CM

## 2024-09-03 NOTE — Progress Notes (Unsigned)
 Cardiology Office Note:    Date:  09/04/2024  ID:  [name] [name], DOB Sep 22, 1946, MRN [id number] PCP: Vernadine Charlie ORN, MD  [facility] Providers Cardiologist:  Maude Emmer, MD Cardiology APP:  Rana Lum CROME, NP       Patient Profile:       Chief Complaint: Hospital follow-up History of Present Illness:  [name] [name] is a 78 y.o. male with visit-pertinent history of paroxysmal AF, stage IV metastatic melanoma (palliative XRT), DM type II, HLD, CVA, coronary artery disease, aortic atherosclerosis  [name] [name] was followed initially by Dr. Maranda and is currently followed by Dr. Emmer for management of atrial fibrillation.  He was last seen in our office on 09/02/2023 and endorsed no new cardiac complaints at that time.  He denied any episodes of rapid AF and was being followed by Dr. Gatha and Dr. Brenna for management of metastatic melanoma of the lungs.    He was admitted to the [name] in [place]  on 08/18/2024 with right face and arm numbness while watching TV.  TNK was not recommended by neurology.  His EKG showed atrial fibrillation with slow ventricular response with rate of 58 bpm.  He admitted to missing doses of his Eliquis  and underwent MRI stroke workup that showed a left thalamic lacunar infarct with no hemorrhage.  CTA head and neck showed Focal high-grade stenosis/occlusion at the left P2 segment with reconstitution distally. Moderate multifocal stenosis at the right P1 P2 junction and P2 segment. Short segment moderate stenosis left M2/anterior temporal MCA branch. Since he was lax in his Eliquis  it was recommended he add another dose of antiplatelet i.e. aspirin  per neurology.  He completed a 2D echo that showed LVEF 55 to 60%, no RWMA, normal diastolic parameters, RV function and size normal, left atrium mildly dilated, no valvular abnormalities.   Discussed the use of AI scribe software for clinical note transcription with the patient, who  gave verbal consent to proceed.  History of Present Illness [name] [name] is an 78 year old male with atrial fibrillation who presents for evaluation after recent admission for stroke.  He is accompanied by his wife.  Today he reports that he experiences weakness and shortness of breath since his stroke.  Reports chronic shortness of breath that is unchanged but weakness/fatigue is new.  He denies palpitations, chest pain, orthopnea, PND, LEE, lightheadedness, or dizziness.  He wears an Apple watch to monitor his heart rhythm.  Does tell me his watch has been alerting him that he is in A-fib for several months.  He tells me since his hospitalization he is now taking his Eliquis  daily and as prescribed.  Following the recent stroke, he has residual right-sided facial and hand numbness and tingling, difficulty with fine motor tasks like buttoning his shirt, and some dysphagia.  He is scheduled to see a neurologist on September 27, 2024.  Review of systems:  Please see the history of present illness. All other systems are reviewed and otherwise negative.      Studies Reviewed:    EKG Interpretation Date/Time:  Monday September 03 2024 13:25:16 EDT Ventricular Rate:  77 PR Interval:    QRS Duration:  88 QT Interval:  380 QTC Calculation: 430 R Axis:   66  Text Interpretation: Atrial fibrillation When compared with ECG of 02-Sep-2023 09:11, Atrial fibrillation has replaced Sinus rhythm Confirmed by Rana Lum [phone]) on 09/04/2024 10:36:57 AM    Echocardiogram 08/20/2024   Left Ventricle: Left ventricular cavity  size is normal. Wall thickness  is normal. The left ventricular ejection fraction is 55-60 %. Systolic  function is normal. Wall motion is normal. Diastolic function is normal.    Right Ventricle: Right ventricular cavity size is normal. Systolic  function is normal.    Left Atrium: The atrium is mildly dilated.    Right Atrium: The atrium is normal in size.    Aortic Valve: The  aortic valve is trileaflet. The leaflets are  moderately thickened. The leaflets are mildly calcified. The leaflets  exhibit normal mobility.    Tricuspid Valve: There is trace regurgitation. The right ventricular  systolic pressure is normal.    IVC/SVC: The inferior vena cava is normal in size.    Pericardium: There is no pericardial effusion.   MRI brain 08/19/2024  1.  Acute left thalamic lacunar infarct. No acute hemorrhage.  2.  No evidence for intracranial metastatic disease.  3.  Chronic microangiopathic changes.   Echocardiogram 01/18/2022 1. Left ventricular ejection fraction, by estimation, is 60 to 65%. The  left ventricle has normal function. The left ventricle has no regional  wall motion abnormalities. Left ventricular diastolic parameters are  indeterminate.   2. Right ventricular systolic function is normal. The right ventricular  size is normal. There is moderately elevated pulmonary artery systolic  pressure.   3. The mitral valve is normal in structure. No evidence of mitral valve  regurgitation. No evidence of mitral stenosis.   4. The aortic valve is normal in structure. Aortic valve regurgitation is  not visualized. No aortic stenosis is present.   5. The inferior vena cava is dilated in size with <50% respiratory  variability, suggesting right atrial pressure of 15 mmHg.   Exercise Myoview  05/12/2017 Nuclear stress EF: 60%. Blood pressure demonstrated a normal response to exercise. No T wave inversion was noted during stress. There was no ST segment deviation noted during stress. Defect 1: There is a medium defect of moderate severity.   Moderate size and intensity, fixed inferoseptal and inferolateral perfusion defects with normal wall motion - suspicious of artifact. No reversible ischemia. LVEF 60%. This is a low risk study.  Echocardiogram 05/12/2017 Study Conclusions   - Left ventricle: The cavity size was normal. Wall thickness was    normal. Systolic  function was normal. The estimated ejection    fraction was in the range of 55% to 60%. Left ventricular    diastolic function parameters were normal.  - Pulmonary arteries: PA peak pressure: 32 mm Hg (S).  Risk Assessment/Calculations:    CHA2DS2-VASc Score = 6   This indicates a 9.7% annual risk of stroke. The patient's score is based upon: CHF History: 0 HTN History: 0 Diabetes History: 1 Stroke History: 2 Vascular Disease History: 1 Age Score: 2 Gender Score: 0             Physical Exam:   VS:  BP 135/70 (BP Location: Left Arm, Patient Position: Sitting, Cuff Size: Normal)   Pulse 77   Ht 5' 11 (1.803 m)   Wt 163 lb (73.9 kg)   SpO2 97%   BMI 22.73 kg/m    Wt Readings from Last 3 Encounters:  09/03/24 163 lb (73.9 kg)  07/16/24 169 lb (76.7 kg)  01/10/24 165 lb 8 oz (75.1 kg)    GEN: Well nourished, well developed in no acute distress NECK: No JVD; No carotid bruits CARDIAC: Irregularly irregular rhythm, no murmurs, rubs, gallops RESPIRATORY:  Clear to auscultation without rales, wheezing  or rhonchi  ABDOMEN: Soft, non-tender, non-distended EXTREMITIES:  No edema; No acute deformity      Assessment and Plan:  Paroxysmal atrial fibrillation/flutter Diagnosed in 2018 in A-fib with RVR by PCP.  Asymptomatic at that time.  Follow-up visit with cardiology 04/2017 he was in NSR Found in persistent A-fib on 02/01/2022 and was set up for cardioversion.  He was found in NSR at the time of cardioversion Admitted 08/2024 in [place]  with left thalamic lacunar infarct.  He was found in atrial flutter with 3-4 block with slow ventricular response with heart rate 40s-50s. Patient did admit he was not taking Eliquis  as directed and was reducing his dose - Today he remains in atrial fibrillation with controlled rate of 77 bpm - Discharged from hospital on 9/8 and reports he has since been compliant with his Eliquis  - I will have him follow-up with the atrial fibrillation clinic  in 2 weeks to consider DCCV - Continue Eliquis  5 mg twice daily, no bleeding concerns - Continue metoprolol  succinate 25 mg daily - CBC,ET, and TSH today   Coronary artery disease Aortic atherosclerosis Three-vessel coronary artery calcifications and aortic atherosclerosis on CT - Today he is stable without anginal symptoms.  No indication for ischemic evaluation at this time - During recent admission was subsequently changed from simvastatin  to atorvastatin  - Continue atorvastatin  80 mg daily  CVA S/p left thalamic lacunar infarct on 08/2024 - Has residual right sided facial and hand numbness/tingling as well as dysphagia - He establishes with neurology on 9/15  Hyperlipidemia, LDL goal <70 LDL 77 on 08/2024 - Previously on simvastatin  now managed on atorvastatin  post CVA - Continue atorvastatin  80 mg daily - Can plan for repeat fasting lipid panel at follow-up visit  T2DM A1c 9.3% on 08/2024 and not well-controlled - Managed on metformin and glipizide - Management per PCP  Stage IV metastatic malignant melanoma Diagnosed 10/2021 S/p palliative radiation and immunotherapy He presented with large right middle lobe lung mass in addition to large right hilar/suprahilar mass and adenopathy  Completed four cycles of ipilimumab  and nivolumab , followed by maintenance nivolumab  for a total of 28 cycles - Currently on observation for the past 9 months with well-managed disease - Management per Dr. Sherrod with oncology      Dispo: Return in 3 months with myself or Dr. Delford.  Follow-up with atrial fibrillation clinic in 2 weeks.  Signed, Lum LITTIE Louis, NP

## 2024-09-03 NOTE — Patient Instructions (Signed)
 Medication Instructions:  NO CHANGES   Lab Work: BMET, CBC, AND TSH TO BE DONE TODAY.   Testing/Procedures: NONE  Follow-Up: At [facility], you and your health needs are our priority.  As part of our continuing mission to provide you with exceptional heart care, our providers are all part of one team.  This team includes your primary Cardiologist (physician) and Advanced Practice Providers or APPs (Physician Assistants and Nurse Practitioners) who all work together to provide you with the care you need, when you need it.  Your next appointment:   2 WEEKS WITH THE AFIB CLINIC 3 MONTHS WITH DR. DELFORD  Provider:   Maude DELFORD, MD OR Lum Louis, NP

## 2024-09-04 ENCOUNTER — Ambulatory Visit: Payer: Self-pay | Admitting: Emergency Medicine

## 2024-09-04 ENCOUNTER — Encounter: Payer: Self-pay | Admitting: Internal Medicine

## 2024-09-04 ENCOUNTER — Telehealth: Payer: Self-pay | Admitting: Cardiovascular Disease

## 2024-09-04 ENCOUNTER — Encounter: Payer: Self-pay | Admitting: Emergency Medicine

## 2024-09-04 LAB — CBC
Hematocrit: 36 % — ABNORMAL LOW (ref 37.5–51.0)
Hemoglobin: 11.6 g/dL — ABNORMAL LOW (ref 13.0–17.7)
MCH: 32.7 pg (ref 26.6–33.0)
MCHC: 32.2 g/dL (ref 31.5–35.7)
MCV: 101 fL — ABNORMAL HIGH (ref 79–97)
Platelets: 227 x10E3/uL (ref 150–450)
RBC: 3.55 x10E6/uL — ABNORMAL LOW (ref 4.14–5.80)
RDW: 12.2 % (ref 11.6–15.4)
WBC: 5.8 x10E3/uL (ref 3.4–10.8)

## 2024-09-04 LAB — BASIC METABOLIC PANEL WITH GFR
BUN/Creatinine Ratio: 16 (ref 10–24)
BUN: 20 mg/dL (ref 8–27)
CO2: 18 mmol/L — ABNORMAL LOW (ref 20–29)
Calcium: 9.3 mg/dL (ref 8.6–10.2)
Chloride: 104 mmol/L (ref 96–106)
Creatinine, Ser: 1.22 mg/dL (ref 0.76–1.27)
Glucose: 166 mg/dL — ABNORMAL HIGH (ref 70–99)
Potassium: 4.5 mmol/L (ref 3.5–5.2)
Sodium: 139 mmol/L (ref 134–144)
eGFR: 60 mL/min/1.73 (ref >59)

## 2024-09-04 LAB — TSH: TSH: 2.57 u[IU]/mL (ref 0.450–4.500)

## 2024-09-04 NOTE — Telephone Encounter (Signed)
 Wife is calling to see if the appt with [facility] on 10/16 is suppose to be with the afib clinic. Please advise

## 2024-09-04 NOTE — Telephone Encounter (Signed)
 Called patient's wife, Ronal ([place]). Informed her that we will cancel the appointment with [facility] that she has an A. FIB appointment on 09/18/24, and to please keep the  follow-up with Dr. Delford in December. Patient's wife verbalized understanding.

## 2024-09-04 NOTE — Telephone Encounter (Signed)
 Called and spoke with patient's wife Emmitte Surgeon, per DPR. Patient's wife states that patient's after visit summary from visit with Lum Louis yesterday (9/22) states that patient's next appointment in 2 weeks should be with the Afib clinic. Patient was scheduled for a two week follow up with [facility] instead of Afib clinic. Advised patient's wife this note would be routed to [facility] and Afib clinic scheduler to advise and schedule. Patient's wife verbalized understanding and agreeable to plan.

## 2024-09-18 ENCOUNTER — Ambulatory Visit (HOSPITAL_COMMUNITY)
Admission: RE | Admit: 2024-09-18 | Discharge: 2024-09-18 | Disposition: A | Discharge status: Home / Self Care | Source: Ambulatory Visit | Attending: Internal Medicine | Admitting: Internal Medicine

## 2024-09-18 ENCOUNTER — Encounter (HOSPITAL_COMMUNITY): Payer: Self-pay | Admitting: Internal Medicine

## 2024-09-18 VITALS — BP 112/80 | HR 83 | Ht 71.0 in | Wt 160.4 lb

## 2024-09-18 DIAGNOSIS — D6869 Other thrombophilia: Secondary | ICD-10-CM | POA: Diagnosis not present

## 2024-09-18 DIAGNOSIS — I4819 Other persistent atrial fibrillation: Secondary | ICD-10-CM | POA: Diagnosis not present

## 2024-09-18 DIAGNOSIS — I4891 Unspecified atrial fibrillation: Secondary | ICD-10-CM | POA: Insufficient documentation

## 2024-09-18 DIAGNOSIS — I480 Paroxysmal atrial fibrillation: Secondary | ICD-10-CM
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
## 2024-09-18 NOTE — Patient Instructions (Addendum)
 HOLD empagliflozin (JARDIANCE  09/21/24 resume after procedure  HOLD morning diabetic medication resume after after procedure  Cardioversion scheduled for:09/25/24 Tuesday at 8:30 am    - Arrive at the [employer] [facility] ([address]  and check in with ADMITTING at 8:30 am    - Do not eat or drink anything after midnight the night prior to your procedure.   - Take all your morning medication (except diabetic medications) with a sip of water prior to arrival.  - Do NOT miss any doses of your blood thinner - if you should miss a dose or take a dose more than 4 hours late -- please notify our office immediately.  - You will not be able to drive home after your procedure. Please ensure you have a responsible adult to drive you home. You will need someone with you for 24 hours post procedure.     - Expect to be in the procedural area approximately 2 hours.   - If you feel as if you go back into normal rhythm prior to scheduled cardioversion, please notify our office immediately.   If your procedure is canceled in the cardioversion suite you will be charged a cancellation fee.       Hold below medications 72 hours prior to scheduled procedure/anesthesia. Restart medication on the following day after scheduled procedure/anesthesia Empagliflozin (Jardiance)    For those patients who have a scheduled procedure/anesthesia on the same day of the week as their dose, hold the medication on the day of surgery.  They can take their scheduled dose the week before.  **Patients on the above medications scheduled for elective procedures that have not held the medication for the appropriate amount of time are at risk of cancellation or change in the anesthetic plan.
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
## 2024-09-24 NOTE — Progress Notes (Signed)
 Spoke to patient and instructed them to come at 0800  and to be NPO after 0000.     Confirmed that patient will have a ride home and someone to stay with them for 24 hours after the procedure.   Medications reviewed.  Confirmed blood thinner.  Confirmed no breaks in taking blood thinner for 3+ weeks prior to procedure. Confirmed patient stopped all GLP-1s and GLP-2s for at least one week before procedure.

## 2024-09-25 ENCOUNTER — Encounter (HOSPITAL_COMMUNITY)
Admission: RE | Disposition: A | Discharge status: Home / Self Care | Payer: Self-pay | Source: Home / Self Care | Attending: Internal Medicine

## 2024-09-25 ENCOUNTER — Ambulatory Visit (HOSPITAL_COMMUNITY)

## 2024-09-25 ENCOUNTER — Ambulatory Visit (HOSPITAL_COMMUNITY)
Admission: RE | Admit: 2024-09-25 | Discharge: 2024-09-25 | Disposition: A | Discharge status: Home / Self Care | Attending: Internal Medicine | Admitting: Internal Medicine

## 2024-09-25 ENCOUNTER — Other Ambulatory Visit: Payer: Self-pay

## 2024-09-25 DIAGNOSIS — I4819 Other persistent atrial fibrillation: Secondary | ICD-10-CM | POA: Diagnosis present

## 2024-09-25 DIAGNOSIS — Z8673 Personal history of transient ischemic attack (TIA), and cerebral infarction without residual deficits: Secondary | ICD-10-CM | POA: Insufficient documentation

## 2024-09-25 DIAGNOSIS — E119 Type 2 diabetes mellitus without complications: Secondary | ICD-10-CM | POA: Diagnosis not present

## 2024-09-25 DIAGNOSIS — Z87891 Personal history of nicotine dependence: Secondary | ICD-10-CM | POA: Diagnosis not present

## 2024-09-25 DIAGNOSIS — Z7984 Long term (current) use of oral hypoglycemic drugs: Secondary | ICD-10-CM | POA: Diagnosis not present

## 2024-09-25 DIAGNOSIS — Z7901 Long term (current) use of anticoagulants: Secondary | ICD-10-CM | POA: Insufficient documentation

## 2024-09-25 DIAGNOSIS — I509 Heart failure, unspecified: Secondary | ICD-10-CM

## 2024-09-25 DIAGNOSIS — I4891 Unspecified atrial fibrillation: Secondary | ICD-10-CM | POA: Diagnosis not present

## 2024-09-25 DIAGNOSIS — I4892 Unspecified atrial flutter: Secondary | ICD-10-CM | POA: Diagnosis not present

## 2024-09-25 DIAGNOSIS — J9601 Acute respiratory failure with hypoxia: Secondary | ICD-10-CM

## 2024-09-25 HISTORY — PX: CARDIOVERSION: EP1203

## 2024-09-25 SURGERY — CARDIOVERSION (CATH LAB)
Anesthesia: General

## 2024-09-25 MED ORDER — SODIUM CHLORIDE 0.9 % IV SOLN: INTRAVENOUS | Status: DC

## 2024-09-25 MED ORDER — LIDOCAINE 2% (20 MG/ML) 5 ML SYRINGE
INTRAMUSCULAR | Status: DC | PRN
  Administered 2024-09-25: 60 mg via INTRAVENOUS

## 2024-09-25 MED ORDER — PROPOFOL 10 MG/ML IV BOLUS
INTRAVENOUS | Status: DC | PRN
  Administered 2024-09-25: 60 mg via INTRAVENOUS

## 2024-09-25 SURGICAL SUPPLY — 1 items: PAD DEFIB RADIO PHYSIO CONN (PAD) ×1 IMPLANT

## 2024-09-25 NOTE — CV Procedure (Signed)
    CARDIOVERSION NOTE  Procedure: Electrical Cardioversion Indications:  Atrial Fibrillation  Procedure Details:  Consent: Risks of procedure as well as the alternatives and risks of each were explained to the (patient/caregiver).  Consent for procedure obtained.  Time Out: Verified patient identification, verified procedure, site/side was marked, verified correct patient position, special equipment/implants available, medications/allergies/relevent history reviewed, required imaging and test results available.  Performed  Patient placed on cardiac monitor, pulse oximetry, supplemental oxygen as necessary.  Sedation given: propofol  per anesthesia Pacer pads placed anterior and posterior chest.  Cardioverted 3 time(s).  Cardioverted at 200J, 300J, and 360J with pressure.  Impression: Findings: Post procedure EKG shows: NSR Complications: None Patient did tolerate procedure well.  Plan: Ultimately successful DCCV with 3 stacked shocks to NSR.  Time Spent Directly with the Patient:  30 minutes   Vinie KYM Maxcy, MD, [facility], FNLA, FACP  [place]  [facility]  Medical Director of the [facility]  [facility] Diplomate of the American Board of Clinical Lipidology Attending Cardiologist  Direct Dial: [phone]  Fax: [phone]  Website:  [email or web address]  Vinie BROCKS Barron Vanloan 09/25/2024, 9:25 AM

## 2024-09-25 NOTE — Anesthesia Postprocedure Evaluation (Signed)
 Anesthesia Post Note  Patient: [name] [name]  Procedure(s) Performed: CARDIOVERSION     Patient location during evaluation: PACU Anesthesia Type: General Level of consciousness: awake and alert Pain management: pain level controlled Vital Signs Assessment: post-procedure vital signs reviewed and stable Respiratory status: spontaneous breathing, nonlabored ventilation and respiratory function stable Cardiovascular status: blood pressure returned to baseline and stable Postop Assessment: no apparent nausea or vomiting Anesthetic complications: no   No notable events documented.  Last Vitals:  Vitals:   09/25/24 0940 09/25/24 0950  BP: 115/67 132/71  Pulse: 81 80  Resp: 14 14  Temp:    SpO2: 97% 98%    Last Pain:  Vitals:   09/25/24 0950  TempSrc:   PainSc: 0-No pain                 Butler Levander Pinal

## 2024-09-25 NOTE — Interval H&P Note (Signed)
 History and Physical Interval Note:  09/25/2024 9:11 AM  [name] [name]  has presented today for surgery, with the diagnosis of afib.  The various methods of treatment have been discussed with the patient and family. After consideration of risks, benefits and other options for treatment, the patient has consented to  Procedure(s): CARDIOVERSION (N/A) as a surgical intervention.  The patient's history has been reviewed, patient examined, no change in status, stable for surgery.  I have reviewed the patient's chart and labs.  Questions were answered to the patient's satisfaction.     Vinie [name]

## 2024-09-25 NOTE — Anesthesia Preprocedure Evaluation (Signed)
 Anesthesia Evaluation  Patient identified by MRN, date of birth, ID band Patient awake    Reviewed: Allergy & Precautions, H&P , NPO status , Patient's Chart, lab work & pertinent test results  Airway Mallampati: II  TM Distance: >3 FB Neck ROM: Full    Dental  (+) Dental Advisory Given   Pulmonary former smoker   Pulmonary exam normal breath sounds clear to auscultation       Cardiovascular +CHF  + dysrhythmias Atrial Fibrillation  Rhythm:Irregular     Neuro/Psych negative neurological ROS  negative psych ROS   GI/Hepatic negative GI ROS, Neg liver ROS,,,  Endo/Other  negative endocrine ROSdiabetes, Type 2    Renal/GU negative Renal ROS  negative genitourinary   Musculoskeletal negative musculoskeletal ROS (+)    Abdominal   Peds negative pediatric ROS (+)  Hematology negative hematology ROS (+)   Anesthesia Other Findings   Reproductive/Obstetrics negative OB ROS                              Anesthesia Physical Anesthesia Plan  ASA: 3  Anesthesia Plan: General   Post-op Pain Management:    Induction: Intravenous  PONV Risk Score and Plan: 2 and Propofol  infusion and Treatment may vary due to age or medical condition  Airway Management Planned: Mask and Simple Face Mask  Additional Equipment:   Intra-op Plan:   Post-operative Plan:   Informed Consent: I have reviewed the patients History and Physical, chart, labs and discussed the procedure including the risks, benefits and alternatives for the proposed anesthesia with the patient or authorized representative who has indicated his/her understanding and acceptance.     Dental advisory given  Plan Discussed with: CRNA  Anesthesia Plan Comments:         Anesthesia Quick Evaluation

## 2024-09-25 NOTE — Transfer of Care (Signed)
 Immediate Anesthesia Transfer of Care Note  Patient: [name] [name]  Procedure(s) Performed: CARDIOVERSION  Patient Location: PACU and Cath Lab  Anesthesia Type:General  Level of Consciousness: awake, alert , and oriented  Airway & Oxygen Therapy: Patient Spontanous Breathing  Post-op Assessment: Report given to RN  Post vital signs: stable  Last Vitals:  Vitals Value Taken Time  BP    Temp    Pulse    Resp    SpO2      Last Pain:  Vitals:   09/25/24 0847  TempSrc:   PainSc: 0-No pain         Complications: No notable events documented.

## 2024-09-25 NOTE — Discharge Instructions (Signed)

## 2024-09-26 ENCOUNTER — Ambulatory Visit: Admitting: Neurology

## 2024-09-26 ENCOUNTER — Encounter (HOSPITAL_COMMUNITY): Payer: Self-pay | Admitting: Internal Medicine

## 2024-09-26 VITALS — BP 136/74 | HR 82 | Resp 17 | Ht 71.0 in | Wt 161.5 lb

## 2024-09-26 DIAGNOSIS — I480 Paroxysmal atrial fibrillation: Secondary | ICD-10-CM | POA: Diagnosis not present

## 2024-09-26 DIAGNOSIS — R1310 Dysphagia, unspecified: Secondary | ICD-10-CM

## 2024-09-26 DIAGNOSIS — I6381 Other cerebral infarction due to occlusion or stenosis of small artery: Secondary | ICD-10-CM | POA: Diagnosis not present
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
## 2024-09-26 NOTE — Patient Instructions (Addendum)
 Continue with Eliquis  as prescribed  Continue your other medication Continue to follow up with PCP  Referral to Occupational Therapy for worsening right hand dexterity Referral to speech therapy for worsening dysphagia following stroke Return if worse

## 2024-09-27 ENCOUNTER — Ambulatory Visit: Admitting: Emergency Medicine

## 2024-10-02 ENCOUNTER — Other Ambulatory Visit: Payer: Self-pay

## 2024-10-02 ENCOUNTER — Ambulatory Visit: Attending: Neurology

## 2024-10-02 ENCOUNTER — Inpatient Hospital Stay: Attending: Internal Medicine

## 2024-10-02 DIAGNOSIS — R1312 Dysphagia, oropharyngeal phase: Secondary | ICD-10-CM | POA: Insufficient documentation

## 2024-10-02 DIAGNOSIS — R278 Other lack of coordination: Secondary | ICD-10-CM | POA: Diagnosis present

## 2024-10-02 DIAGNOSIS — I6381 Other cerebral infarction due to occlusion or stenosis of small artery: Secondary | ICD-10-CM | POA: Diagnosis not present

## 2024-10-02 DIAGNOSIS — R1310 Dysphagia, unspecified: Secondary | ICD-10-CM | POA: Insufficient documentation

## 2024-10-02 DIAGNOSIS — M6281 Muscle weakness (generalized): Secondary | ICD-10-CM | POA: Diagnosis present

## 2024-10-02 DIAGNOSIS — I69351 Hemiplegia and hemiparesis following cerebral infarction affecting right dominant side: Secondary | ICD-10-CM | POA: Insufficient documentation

## 2024-10-02 DIAGNOSIS — Z452 Encounter for adjustment and management of vascular access device: Secondary | ICD-10-CM | POA: Insufficient documentation

## 2024-10-02 DIAGNOSIS — C7801 Secondary malignant neoplasm of right lung: Secondary | ICD-10-CM | POA: Insufficient documentation

## 2024-10-02 DIAGNOSIS — C439 Malignant melanoma of skin, unspecified: Secondary | ICD-10-CM | POA: Insufficient documentation

## 2024-10-02 DIAGNOSIS — R29898 Other symptoms and signs involving the musculoskeletal system: Secondary | ICD-10-CM | POA: Diagnosis present
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
## 2024-10-02 NOTE — Patient Instructions (Addendum)
    TONGUE exercises  --- DO ALL EXERCISES TWICE, 5 days/week  1. Tongue sweep - Sweep your tongue in the pockets of your mouth - touch every tooth  - ten revolutions each way  2. MAKE THE SOUNDS STRONG!!      TUH!!       (2 seconds)       TIE!  (2 seconds)      TWO!!- 20 times      KUH!!     (2 seconds)   COO!     (2 seconds)    GUY!!  - 20 times      DOE!!   (2 seconds)   DEE!      (2 seconds)   DIE!!  - 20 times  3. Move a licorice stick or straw from side to side in your mouth - 20 back and forth movements

## 2024-10-03 ENCOUNTER — Ambulatory Visit

## 2024-10-03 DIAGNOSIS — R278 Other lack of coordination: Secondary | ICD-10-CM

## 2024-10-03 DIAGNOSIS — I69351 Hemiplegia and hemiparesis following cerebral infarction affecting right dominant side: Secondary | ICD-10-CM

## 2024-10-03 DIAGNOSIS — R1312 Dysphagia, oropharyngeal phase: Secondary | ICD-10-CM | POA: Diagnosis not present

## 2024-10-03 DIAGNOSIS — M6281 Muscle weakness (generalized): Secondary | ICD-10-CM

## 2024-10-03 DIAGNOSIS — R29898 Other symptoms and signs involving the musculoskeletal system: Secondary | ICD-10-CM

## 2024-10-03 NOTE — Patient Instructions (Signed)
   Safety considerations for loss of sensation:   Look at affected hand when using it!   Do NOT use affected arm for anything: sharp, hot, breakable, or too heavy  Always check temperature of water (for showering, washing dishes, etc) with UNaffected arm/extremity  Consider travel mugs w/ lids to transport hot liquids/coffee  Consider alternative options and/or adaptive equipment to make things safer (ex: hand chopper or cut resistant glove for chopping vegetables)   Avoid cold temperatures as well (wear glove in cold temperatures, get ice w/ unaffected extremity)  AVOID handling chemicals and machinery
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
## 2024-10-09 ENCOUNTER — Encounter (HOSPITAL_COMMUNITY): Payer: Self-pay | Admitting: Internal Medicine

## 2024-10-09 ENCOUNTER — Ambulatory Visit: Admitting: Occupational Therapy

## 2024-10-09 ENCOUNTER — Ambulatory Visit (HOSPITAL_COMMUNITY)
Admission: RE | Admit: 2024-10-09 | Discharge: 2024-10-09 | Disposition: A | Discharge status: Home / Self Care | Source: Ambulatory Visit | Attending: Internal Medicine | Admitting: Internal Medicine

## 2024-10-09 VITALS — BP 102/70 | HR 99 | Ht 71.0 in | Wt 158.6 lb

## 2024-10-09 DIAGNOSIS — R1312 Dysphagia, oropharyngeal phase: Secondary | ICD-10-CM | POA: Diagnosis not present

## 2024-10-09 DIAGNOSIS — I4892 Unspecified atrial flutter: Secondary | ICD-10-CM

## 2024-10-09 DIAGNOSIS — I69351 Hemiplegia and hemiparesis following cerebral infarction affecting right dominant side: Secondary | ICD-10-CM

## 2024-10-09 DIAGNOSIS — I4819 Other persistent atrial fibrillation: Secondary | ICD-10-CM | POA: Diagnosis not present

## 2024-10-09 DIAGNOSIS — I480 Paroxysmal atrial fibrillation: Secondary | ICD-10-CM | POA: Diagnosis not present

## 2024-10-09 DIAGNOSIS — D6869 Other thrombophilia: Secondary | ICD-10-CM | POA: Diagnosis not present

## 2024-10-09 DIAGNOSIS — R278 Other lack of coordination: Secondary | ICD-10-CM

## 2024-10-09 MED ORDER — APIXABAN 5 MG PO TABS: 5.0000 mg | ORAL_TABLET | Freq: Two times a day (BID) | ORAL | 6 refills | Status: AC
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
## 2024-10-11 ENCOUNTER — Ambulatory Visit

## 2024-10-11 ENCOUNTER — Ambulatory Visit: Admitting: Occupational Therapy

## 2024-10-11 DIAGNOSIS — R278 Other lack of coordination: Secondary | ICD-10-CM

## 2024-10-11 DIAGNOSIS — R1312 Dysphagia, oropharyngeal phase: Secondary | ICD-10-CM | POA: Diagnosis not present

## 2024-10-11 DIAGNOSIS — I69351 Hemiplegia and hemiparesis following cerebral infarction affecting right dominant side: Secondary | ICD-10-CM

## 2024-10-11 NOTE — Patient Instructions (Addendum)
 TONGUE exercises  --- DO ALL EXERCISES TWICE, 5 days/week   1. Tongue sweep - Sweep your tongue in the pockets of your mouth - touch every tooth  - ten revolutions each way   2. MAKE THE SOUNDS STRONG!!      TUH!!       (2 seconds)       TIE!  (2 seconds)      TWO!!- 15 times      KUH!!     (2 seconds)   COO!     (2 seconds)    GUY!!  - 15 times      DOE!!   (2 seconds)   DEE!      (2 seconds)   DIE!!  - 15 times   3. Move a licorice stick or straw from side to side in your mouth - 20 back and forth movements

## 2024-10-11 NOTE — Patient Instructions (Signed)
 Re/desensitization Techniques Some ways to Re/desensitize a hyper or hyposensitive ie) numb, tingling limb/area is by rubbing it with different textures. This will make your limb more tolerant/aware of touch and pressure. Before you begin, make sure your hands and the materials you're using are clean.  To rub your painful/sensitive area with different textures: Sit in a comfortable position with the numb/painful/sensitive area uncovered. Start with a material that is soft, such as a cotton ball, silk or soft towel. Rub your painful/sensitive area in all directions. Start with a light pressure and gradually increase the pressure as tolerated.  Vary the textures you use, as you can tolerate them. Start with soft materials like cotton balls or a makeup brush. Progress to materials that are rougher. Examples include a paper towel, cloth towel, wool, or velcro. As you progress, gradually increase the pressure and roughness of the texture you use. Be careful not to rub over any incisions or wounds, if you still have staples or sutures in place, or if there are any open areas. Rub your limb for at least 30 seconds progressing up to a minute or two as often as you can tolerate, or as recommended by your healthcare provider. Be careful not to irritate your skin or rub your skin raw.  Stop rubbing your affected area if you notice redness that does not dissipate in 15-30 minutes, bleeding or opening skin, and contact your healthcare provider.   Other methods for re/desensitization include: Allow cool or warm water to run over the area.  Be careful not to get the water too hot, especially if you have decreases sensation of temperature. Putting your affected area into a bowl of dry rice, sand, kidney beans, cold water or warm water. You can hide objects in the dry materials to find. Make a bag of miscellaneous matching objects to feel and match based on feel ie) paper clips, nuts, bolts, dice, marbles, checkers,  dominos, beads etc.   If you choose to start with the method of dipping your affected area into a medium such as rice, sand, or water make sure to move the affected area around in the bowl until you cannot tolerate it or you reach one to two minutes, whichever comes first. You can use a combination of these methods for 10 to 15 minutes, 3-4 times per day.
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
## 2024-10-16 ENCOUNTER — Ambulatory Visit: Attending: Neurology

## 2024-10-16 DIAGNOSIS — M6281 Muscle weakness (generalized): Secondary | ICD-10-CM | POA: Insufficient documentation

## 2024-10-16 DIAGNOSIS — I69351 Hemiplegia and hemiparesis following cerebral infarction affecting right dominant side: Secondary | ICD-10-CM | POA: Insufficient documentation

## 2024-10-16 DIAGNOSIS — R208 Other disturbances of skin sensation: Secondary | ICD-10-CM | POA: Insufficient documentation

## 2024-10-16 DIAGNOSIS — R29898 Other symptoms and signs involving the musculoskeletal system: Secondary | ICD-10-CM | POA: Insufficient documentation

## 2024-10-16 DIAGNOSIS — R1312 Dysphagia, oropharyngeal phase: Secondary | ICD-10-CM | POA: Insufficient documentation

## 2024-10-16 DIAGNOSIS — R278 Other lack of coordination: Secondary | ICD-10-CM | POA: Insufficient documentation

## 2024-10-16 NOTE — Patient Instructions (Addendum)
   TONGUE exercises  --- DO ALL EXERCISES TWICE, 5 days/week   1. Tongue sweep - Sweep your tongue in the pockets of your mouth - touch every tooth  - ten revolutions each way   2. MAKE THE SOUNDS STRONG!!      TUH!!       (2 seconds)       TIE!  (2 seconds)      TWO!!- 15 times      KUH!!     (2 seconds)   COO!     (2 seconds)    GUY!!  - 15 times      DOE!!   (2 seconds)   DEE!      (2 seconds)   DIE!!  - 15 times   3. Move a licorice stick or straw from side to side in your mouth - 20 back and forth movements  4.  With your tongue, put gauze or a cough drop in the pocket between your teeth and cheek        Remove it with your tongue and move it to the pocket on the opposite side and remove it        Do 10 back and forth movements between pockets
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
## 2024-10-17 ENCOUNTER — Ambulatory Visit

## 2024-10-17 DIAGNOSIS — R278 Other lack of coordination: Secondary | ICD-10-CM

## 2024-10-17 DIAGNOSIS — M6281 Muscle weakness (generalized): Secondary | ICD-10-CM

## 2024-10-17 NOTE — Patient Instructions (Addendum)
   Compensation Strategies for Tremors  When eating, try the following: . Eat out of bowls, divided plates, or use a plate guard (available at a medical supply store) and eat with a spoon so that you have an edge to scoop up food. . Try raising your plate so that there is less distance between the plate and mouth. . Try stabilizing elbows on the table or against your body. . Use utensil with built-up/larger grips as they are easier to hold.  When writing, try the following:   . Stabilize forearm on the table . Take your time as rushing/being stressed can increase tremors. . Try a felt-tipped pen, it does not glide as much.  Avoid gel pens (they move too much). . Consider using pre-printed labels with your name and address (carry them with you when you go out) or you can get stamps with your address or signature on it. . Use a small tape recorder to record messages/reminders for yourself. . Use pens with bigger grips.  When brushing your teeth, putting on make-up, or styling hair, try the following: . Use an electric toothbrush. . Use items with built-up grips. . Stabilize your elbows against your body or on the counter. . Use long-handled brushes/combs. . Use a hair dryer with a stand.  In general: . Avoid stress, fatigue, or rushing as this can increase tremors. . Sit down for activities that require more control/coordination.
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
## 2024-10-20 ENCOUNTER — Other Ambulatory Visit: Payer: Self-pay | Admitting: Cardiovascular Disease

## 2024-10-23 ENCOUNTER — Ambulatory Visit

## 2024-10-23 DIAGNOSIS — R1312 Dysphagia, oropharyngeal phase: Secondary | ICD-10-CM
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
## 2024-10-24 ENCOUNTER — Other Ambulatory Visit (HOSPITAL_COMMUNITY): Payer: Self-pay | Admitting: Neurology

## 2024-10-24 ENCOUNTER — Ambulatory Visit

## 2024-10-24 DIAGNOSIS — R278 Other lack of coordination: Secondary | ICD-10-CM

## 2024-10-24 DIAGNOSIS — R29898 Other symptoms and signs involving the musculoskeletal system: Secondary | ICD-10-CM

## 2024-10-24 DIAGNOSIS — R1310 Dysphagia, unspecified: Secondary | ICD-10-CM

## 2024-10-24 DIAGNOSIS — I69351 Hemiplegia and hemiparesis following cerebral infarction affecting right dominant side: Secondary | ICD-10-CM

## 2024-10-24 DIAGNOSIS — R059 Cough, unspecified: Secondary | ICD-10-CM

## 2024-10-24 DIAGNOSIS — M6281 Muscle weakness (generalized): Secondary | ICD-10-CM
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
## 2024-10-24 NOTE — Patient Instructions (Signed)
 PWR! Hand Exercises Perform each exercise at least 10 repetitions 1x/day, and PWR! PUSH throughout the day when you are having trouble using your hands (picking up small objects, writing, eating, typing, buttoning, etc.). ** Make each movement big and deliberate; feel the movement.  PWR! UP: Fists to open fingers BIG  PWR! Rock:  Move wrists up and down [employer]! Twist: Twist palms up and down BIG  PWR! Step: Step your thumb to index finger while keeping other fingers straight. Flick fingers out BIG (thumb out/straighten fingers). Repeat with other fingers.  PWR! PUSH: Push hands out BIG. Elbows straight, wrists up, fingers open and spread apart BIG.

## 2024-10-26 ENCOUNTER — Ambulatory Visit: Admitting: Occupational Therapy

## 2024-10-26 DIAGNOSIS — R278 Other lack of coordination: Secondary | ICD-10-CM

## 2024-10-26 DIAGNOSIS — M6281 Muscle weakness (generalized): Secondary | ICD-10-CM

## 2024-10-26 DIAGNOSIS — I69351 Hemiplegia and hemiparesis following cerebral infarction affecting right dominant side: Secondary | ICD-10-CM

## 2024-10-26 DIAGNOSIS — R29898 Other symptoms and signs involving the musculoskeletal system: Secondary | ICD-10-CM
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
## 2024-10-29 ENCOUNTER — Ambulatory Visit: Admitting: Occupational Therapy

## 2024-10-29 ENCOUNTER — Ambulatory Visit

## 2024-10-29 DIAGNOSIS — I69351 Hemiplegia and hemiparesis following cerebral infarction affecting right dominant side: Secondary | ICD-10-CM

## 2024-10-29 DIAGNOSIS — R278 Other lack of coordination: Secondary | ICD-10-CM

## 2024-10-29 DIAGNOSIS — R1312 Dysphagia, oropharyngeal phase: Secondary | ICD-10-CM
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
## 2024-10-31 ENCOUNTER — Ambulatory Visit

## 2024-10-31 DIAGNOSIS — R278 Other lack of coordination: Secondary | ICD-10-CM

## 2024-10-31 DIAGNOSIS — I69351 Hemiplegia and hemiparesis following cerebral infarction affecting right dominant side: Secondary | ICD-10-CM

## 2024-10-31 DIAGNOSIS — R208 Other disturbances of skin sensation: Secondary | ICD-10-CM

## 2024-10-31 DIAGNOSIS — M6281 Muscle weakness (generalized): Secondary | ICD-10-CM
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
## 2024-11-12 ENCOUNTER — Ambulatory Visit: Admitting: Occupational Therapy

## 2024-11-12 ENCOUNTER — Ambulatory Visit

## 2024-11-12 DIAGNOSIS — R1312 Dysphagia, oropharyngeal phase: Secondary | ICD-10-CM

## 2024-11-12 DIAGNOSIS — R278 Other lack of coordination: Secondary | ICD-10-CM | POA: Diagnosis present

## 2024-11-12 DIAGNOSIS — M6281 Muscle weakness (generalized): Secondary | ICD-10-CM | POA: Insufficient documentation

## 2024-11-12 DIAGNOSIS — I69351 Hemiplegia and hemiparesis following cerebral infarction affecting right dominant side: Secondary | ICD-10-CM | POA: Diagnosis present

## 2024-11-12 DIAGNOSIS — R29898 Other symptoms and signs involving the musculoskeletal system: Secondary | ICD-10-CM | POA: Insufficient documentation

## 2024-11-12 DIAGNOSIS — R208 Other disturbances of skin sensation: Secondary | ICD-10-CM | POA: Diagnosis present
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
## 2024-11-13 ENCOUNTER — Inpatient Hospital Stay: Attending: Internal Medicine

## 2024-11-13 NOTE — Progress Notes (Unsigned)
 Electrophysiology Office Note:   Date:  11/14/2024  ID:  [name] [name], DOB 25-Feb-1946, MRN [id number]  Primary Cardiologist: Maude Emmer, MD Primary Heart Failure: None Electrophysiologist: None      History of Present Illness:   [name] [name] is a 78 y.o. male with h/o atrial fibrillation, diabetes, CVA, hyperlipidemia, coronary artery disease, aortic atherosclerosis, stage IV metastatic melanoma seen today for  for Electrophysiology evaluation of atrial fibrillation at the request of [place].    He was found to be in persistent atrial fibrillation 02/09/2022.  He was set up for cardioversion.  He presented to cardioversion in sinus rhythm.  He then was admitted to the hospital September 2025 and [place]  for a CVA and was noted to be in slow atrial fibrillation.  He admitted to noncompliance with his anticoagulation at the time.  He had cardioversion 09/25/2024, but has had recurrence of atrial flutter at follow-up. Discussed the use of AI scribe software for clinical note transcription with the patient, who gave verbal consent to proceed.  History of Present Illness [name] [name] is an 78 year old male with atrial fibrillation who presents with fatigue and weakness. He is accompanied by his wife.  He has a history of atrial fibrillation diagnosed many years ago during an annual physical when his heart rate was noted to be 133 bpm. Initially treated with Eliquis , he was later taken off it by a cardiologist. He is often unaware of when he is in atrial fibrillation, as indicated by his watch, and does not feel any different during episodes.  Over the past six to seven months, he has experienced a decline in strength and activity levels, which has worsened since starting Ozempic. He describes a gradual decline in his health over the past three years, following surgery for two masses in his right lung and concurrent heart issues. He had a stroke on September 5th.  He recalls an episode of atrial  fibrillation occurring after receiving immunotherapy, during which he also had pneumonia. A planned cardioversion was not performed as his heart rhythm returned to normal spontaneously. He has been feeling particularly unwell since the spring, with increased fatigue and reduced ability to perform activities.  He is currently not in remission for melanoma but is stable, undergoing CT scans every six months. He experiences congestion and swallowing difficulties following his stroke, often needing to chew food multiple times before swallowing.   Review of systems complete and found to be negative unless listed in HPI.   EP Information / Studies Reviewed:    EKG is ordered today. Personal review as below.  EKG Interpretation Date/Time:  Wednesday November 14 2024 10:25:48 EST Ventricular Rate:  101 PR Interval:    QRS Duration:  76 QT Interval:  354 QTC Calculation: 459 R Axis:   74  Text Interpretation: Atrial fibrillation with rapid ventricular response Nonspecific ST and T wave abnormality When compared with ECG of 09-Oct-2024 11:08, Atrial fibrillation has replaced Atrial flutter Confirmed by Dionel Archey ([id number]) on 11/14/2024 10:31:26 AM   Risk Assessment/Calculations:    CHA2DS2-VASc Score = 6   This indicates a 9.7% annual risk of stroke. The patient's score is based upon: CHF History: 0 HTN History: 0 Diabetes History: 1 Stroke History: 2 Vascular Disease History: 1 Age Score: 2 Gender Score: 0            Physical Exam:   VS:  BP 134/76 (BP Location: Left Arm, Patient Position: Sitting, Cuff Size: Normal)   Pulse (!) 101  Ht 5' 11 (1.803 m)   Wt 151 lb 9.6 oz (68.8 kg)   SpO2 96%   BMI 21.14 kg/m    Wt Readings from Last 3 Encounters:  11/14/24 151 lb 9.6 oz (68.8 kg)  10/09/24 158 lb 9.6 oz (71.9 kg)  09/26/24 161 lb 8 oz (73.3 kg)     GEN: Well nourished, well developed in no acute distress NECK: No JVD; No carotid bruits CARDIAC: Irregularly irregular  rate and rhythm, no murmurs, rubs, gallops RESPIRATORY:  Clear to auscultation without rales, wheezing or rhonchi  ABDOMEN: Soft, non-tender, non-distended EXTREMITIES:  No edema; No deformity   ASSESSMENT AND PLAN:    1.  Persistent atrial fibrillation/flutter: On metoprolol .  He feels quite poorly in atrial fibrillation.  Due to that, we [name] plan for amiodarone  load and cardioversion.  I did discuss with him the possibility of ablation, which both he and his wife are interested in.  I told him that we would plan for cardioversion, and if he feels better in sinus rhythm, that ablation would be an option, though he is quite frail, I think that he would do okay with the procedure.  [name] have him follow-up in A-fib clinic after the cardioversion to further discuss this or whether or not to stay on amiodarone  and plan for rate control with medications.  2.  Secondary hypercoagulable state: On Eliquis   3.  Coronary artery disease: Has three-vessel calcifications.  No current angina.  Plan per primary cardiology.  4.  Stage IV metastatic melanoma: Diagnosed in 2022.  On palliative radiation and immunotherapy.  Follow up with Afib Clinic as usual post procedure  Signed, Delita Chiquito Gladis Norton, MD

## 2024-11-13 NOTE — H&P (View-Only) (Signed)
 Electrophysiology Office Note:   Date:  11/14/2024  ID:  [name] [name], DOB 25-Feb-1946, MRN [id number]  Primary Cardiologist: [name] Emmer, MD Primary Heart Failure: None Electrophysiologist: None      History of Present Illness:   [name] [name] is a 78 y.o. male with h/o atrial fibrillation, diabetes, CVA, hyperlipidemia, coronary artery disease, aortic atherosclerosis, stage IV metastatic melanoma seen today for  for Electrophysiology evaluation of atrial fibrillation at the request of [name] [name].    He was found to be in persistent atrial fibrillation 02/09/2022.  He was set up for cardioversion.  He presented to cardioversion in sinus rhythm.  He then was admitted to the hospital September 2025 and [place]  for a CVA and was noted to be in slow atrial fibrillation.  He admitted to noncompliance with his anticoagulation at the time.  He had cardioversion 09/25/2024, but has had recurrence of atrial flutter at follow-up. Discussed the use of AI scribe software for clinical note transcription with the patient, who gave verbal consent to proceed.  History of Present Illness [name] [name] is an 78 year old male with atrial fibrillation who presents with fatigue and weakness. He is accompanied by his wife.  He has a history of atrial fibrillation diagnosed many years ago during an annual physical when his heart rate was noted to be 133 bpm. Initially treated with Eliquis , he was later taken off it by a cardiologist. He is often unaware of when he is in atrial fibrillation, as indicated by his watch, and does not feel any different during episodes.  Over the past six to seven months, he has experienced a decline in strength and activity levels, which has worsened since starting Ozempic. He describes a gradual decline in his health over the past three years, following surgery for two masses in his right lung and concurrent heart issues. He had a stroke on September 5th.  He recalls an episode of atrial  fibrillation occurring after receiving immunotherapy, during which he also had pneumonia. A planned cardioversion was not performed as his heart rhythm returned to normal spontaneously. He has been feeling particularly unwell since the spring, with increased fatigue and reduced ability to perform activities.  He is currently not in remission for melanoma but is stable, undergoing CT scans every six months. He experiences congestion and swallowing difficulties following his stroke, often needing to chew food multiple times before swallowing.   Review of systems complete and found to be negative unless listed in HPI.   EP Information / Studies Reviewed:    EKG is ordered today. Personal review as below.  EKG Interpretation Date/Time:  Wednesday November 14 2024 10:25:48 EST Ventricular Rate:  101 PR Interval:    QRS Duration:  76 QT Interval:  354 QTC Calculation: 459 R Axis:   74  Text Interpretation: Atrial fibrillation with rapid ventricular response Nonspecific ST and T wave abnormality When compared with ECG of 09-Oct-2024 11:08, Atrial fibrillation has replaced Atrial flutter Confirmed by [name] [name] ([id number]) on 11/14/2024 10:31:26 AM   Risk Assessment/Calculations:    CHA2DS2-VASc Score = 6   This indicates a 9.7% annual risk of stroke. The patient's score is based upon: CHF History: 0 HTN History: 0 Diabetes History: 1 Stroke History: 2 Vascular Disease History: 1 Age Score: 2 Gender Score: 0            Physical Exam:   VS:  BP 134/76 (BP Location: Left Arm, Patient Position: Sitting, Cuff Size: Normal)   Pulse (!) 101  Ht 5' 11 (1.803 m)   Wt 151 lb 9.6 oz (68.8 kg)   SpO2 96%   BMI 21.14 kg/m    Wt Readings from Last 3 Encounters:  11/14/24 151 lb 9.6 oz (68.8 kg)  10/09/24 158 lb 9.6 oz (71.9 kg)  09/26/24 161 lb 8 oz (73.3 kg)     GEN: Well nourished, well developed in no acute distress NECK: No JVD; No carotid bruits CARDIAC: Irregularly irregular  rate and rhythm, no murmurs, rubs, gallops RESPIRATORY:  Clear to auscultation without rales, wheezing or rhonchi  ABDOMEN: Soft, non-tender, non-distended EXTREMITIES:  No edema; No deformity   ASSESSMENT AND PLAN:    1.  Persistent atrial fibrillation/flutter: On metoprolol .  He feels quite poorly in atrial fibrillation.  Due to that, we [name] [name] plan for amiodarone  load and cardioversion.  I did discuss with him the possibility of ablation, which both he and his wife are interested in.  I told him that we would plan for cardioversion, and if he feels better in sinus rhythm, that ablation would be an option, though he is quite frail, I think that he would do okay with the procedure.  [name] [name] have him follow-up in A-fib clinic after the cardioversion to further discuss this or whether or not to stay on amiodarone  and plan for rate control with medications.  2.  Secondary hypercoagulable state: On Eliquis   3.  Coronary artery disease: Has three-vessel calcifications.  No current angina.  Plan per primary cardiology.  4.  Stage IV metastatic melanoma: Diagnosed in 2022.  On palliative radiation and immunotherapy.  Follow up with Afib Clinic as usual post procedure  Signed, [name] Chiquito Gladis Norton, MD

## 2024-11-14 ENCOUNTER — Ambulatory Visit

## 2024-11-14 ENCOUNTER — Ambulatory Visit: Attending: Cardiology | Admitting: Cardiology

## 2024-11-14 ENCOUNTER — Encounter: Payer: Self-pay | Admitting: Cardiology

## 2024-11-14 VITALS — BP 134/76 | HR 101 | Ht 71.0 in | Wt 151.6 lb

## 2024-11-14 DIAGNOSIS — I4819 Other persistent atrial fibrillation: Secondary | ICD-10-CM

## 2024-11-14 DIAGNOSIS — D6869 Other thrombophilia: Secondary | ICD-10-CM

## 2024-11-14 DIAGNOSIS — R29898 Other symptoms and signs involving the musculoskeletal system: Secondary | ICD-10-CM

## 2024-11-14 DIAGNOSIS — R208 Other disturbances of skin sensation: Secondary | ICD-10-CM

## 2024-11-14 DIAGNOSIS — Z01812 Encounter for preprocedural laboratory examination: Secondary | ICD-10-CM

## 2024-11-14 DIAGNOSIS — I4891 Unspecified atrial fibrillation: Secondary | ICD-10-CM | POA: Diagnosis not present

## 2024-11-14 DIAGNOSIS — I2510 Atherosclerotic heart disease of native coronary artery without angina pectoris: Secondary | ICD-10-CM | POA: Diagnosis not present

## 2024-11-14 DIAGNOSIS — R278 Other lack of coordination: Secondary | ICD-10-CM

## 2024-11-14 DIAGNOSIS — I69351 Hemiplegia and hemiparesis following cerebral infarction affecting right dominant side: Secondary | ICD-10-CM

## 2024-11-14 MED ORDER — AMIODARONE HCL 200 MG PO TABS: ORAL_TABLET | ORAL | 2 refills | Status: AC
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
## 2024-11-14 NOTE — Patient Instructions (Signed)
 Medication Instructions:  Your physician has recommended you make the following change in your medication:  START Amiodarone      Take 200 mg TWICE a day for one month, then      Take 200 mg ONCE a day thereafter  *If you need a refill on your cardiac medications before your next appointment, please call your pharmacy*  Lab Work: Pre procedure labs: BMET & CBC  If you have any lab test that is abnormal or we need to change your treatment, we will call you to review the results.  Testing/Procedures: Your physician has recommended that you have a Cardioversion (DCCV). Electrical Cardioversion uses a jolt of electricity to your heart either through paddles or wired patches attached to your chest. This is a controlled, usually prescheduled, procedure. Defibrillation is done under light anesthesia in the hospital, and you usually go home the day of the procedure. This is done to get your heart back into a normal rhythm. You are not awake for the procedure. Please see the instruction sheet below located under other instructions.      Dear [name] [name]  You are scheduled for a Cardioversion on Monday, December 22 with Dr. Loni.  Please arrive at the [facility] (Main Entrance A) at [facility]: [address] [place], [place] [address] at 7:00 AM (This time is 1 hour(s) before your procedure to ensure your preparation).   Free valet parking service is available. You will check in at ADMITTING.   *Please Note: You will receive a call the day before your procedure to confirm the appointment time. That time may have changed from the original time based on the schedule for that day.*   DIET:  Nothing to eat or drink after midnight except a sip of water with medications (see medication instructions below)  MEDICATION INSTRUCTIONS: !!IF ANY NEW MEDICATIONS ARE STARTED AFTER TODAY, PLEASE NOTIFY YOUR PROVIDER AS SOON AS POSSIBLE!!  FYI: Medications such as Semaglutide (Ozempic, Wegovy),  Tirzepatide (Mounjaro, Zepbound), Dulaglutide (Trulicity), etc (GLP1 agonists) AND Canagliflozin (Invokana), Dapagliflozin (Farxiga), Empagliflozin (Jardiance), Ertugliflozin (Steglatro), Bexagliflozin [employer]) or any combination with one of these drugs such as Invokamet (Canagliflozin/Metformin), Synjardy (Empagliflozin/Metformin), etc (SGLT2 inhibitors) must be held around the time of a procedure. This is not a comprehensive list of all of these drugs. Please review all of your medications and talk to your provider if you take any one of these. If you are not sure, ask your provider.   HOLD: Empagliflozin (Jardiance) for 3 days prior to the procedure. Last dose on Thursday, December 18.   HOLD: Metformin and Glipizide on the day of the Procedure. Last dose will be taken Sunday 12/02/24  Continue taking your anticoagulant (blood thinner): Apixaban  (Eliquis ).  You will need to continue this after your procedure until you are told by your provider that it is safe to stop.    LABS CBC, BMP  FYI:  For your safety, and to allow us  to monitor your vital signs accurately during the surgery/procedure we request: If you have artificial nails, gel coating, SNS etc, please have those removed prior to your surgery/procedure. Not having the nail coverings /polish removed may result in cancellation or delay of your surgery/procedure.  Your support person will be asked to wait in the waiting room during your procedure.  It is OK to have someone drop you off and come back when you are ready to be discharged.  You cannot drive after the procedure and will need someone to  drive you home.  Bring your insurance cards.  *Special Note: Every effort is made to have your procedure done on time. Occasionally there are emergencies that occur at the hospital that may cause delays. Please be patient if a delay does occur.     Follow-Up: At [facility], you and your health needs are our priority.  As  part of our continuing mission to provide you with exceptional heart care, our providers are all part of one team.  This team includes your primary Cardiologist (physician) and Advanced Practice Providers or APPs (Physician Assistants and Nurse Practitioners) who all work together to provide you with the care you need, when you need it.  Your next appointment:   January 2026  Provider:   You will follow up in the Atrial Fibrillation Clinic located at [facility]. Your provider will be: Clint R. Fenton, PA-C or Fairy Heinrich, PA-C     Thank you for choosing [employer]!!   Maeola Domino, RN [phone]   Other Instructions

## 2024-11-15 ENCOUNTER — Ambulatory Visit (HOSPITAL_COMMUNITY)
Admission: RE | Admit: 2024-11-15 | Discharge: 2024-11-15 | Disposition: A | Source: Ambulatory Visit | Attending: Internal Medicine | Admitting: Internal Medicine

## 2024-11-15 ENCOUNTER — Ambulatory Visit (HOSPITAL_COMMUNITY)
Admission: RE | Admit: 2024-11-15 | Discharge: 2024-11-15 | Disposition: A | Source: Ambulatory Visit | Attending: Internal Medicine

## 2024-11-15 DIAGNOSIS — R1310 Dysphagia, unspecified: Secondary | ICD-10-CM

## 2024-11-15 DIAGNOSIS — R1312 Dysphagia, oropharyngeal phase: Secondary | ICD-10-CM

## 2024-11-15 DIAGNOSIS — R059 Cough, unspecified: Secondary | ICD-10-CM

## 2024-11-15 NOTE — Progress Notes (Addendum)
 Modified Barium Swallow Study  Patient Details  Name: [name] [name] MRN: [id number] Date of Birth: 1946-08-17  Today's Date: 11/15/2024  Modified Barium Swallow completed.  Full report located under Chart Review in the Imaging Section.  History of Present Illness 78 yo male presenting for MBS by referral of OP SLP. Recent admission at OSH September 2025 with L thalamic CVA. Currently targeting oral motor strengthening HEP with ongoing reports of dysphagia stemming from prior to recent CVA. Per SLP note, pt describes regurgitating 5-10 times during meals and having significant amounts of mucous production. PMH: PAF, CAP (2023), malignant melanoma metastatic to lung with palliative radiation and immunotherapy, CHF, T2DM, prior CVA   Clinical Impression Pt presents with mild oropharyngeal dysphagia characterized by intermittent moments of mistiming. Despite suspected cervical osteophytes, epiglottic inversion and laryngeal elevation are complete but the swallow is initiated at the pyriform sinuses, allowing a single instance of frank penetration with nectar thick liquids that was expelled with a cued throat clear (PAS 4). Otherwise, only trace superificial penetration occurred with the remaining trials of thin and nectar thick liquids (PAS 2). Oral residue is overall trace and pt independently clears it with a subswallow. Residue collects more notably in the pharynx but ultimately clears. The 13 mm barium was transiently lodged in the valleculae when given with thin liquids but traveled through the PES with subsequent trials of thin liquids and puree. While the tablet traveled promptly through the esophagus, there was barium retention with retrograde flow below the PES. Discussed with APP and recommend dedicated esophageal assessment as next step given pt's persistent complaints of globus and regurgitation with meals. Will defer ongoing f/u for HEP to OP SLP.   DIGEST Swallow Severity  Rating*  Safety: 1  Efficiency: 1  Overall Pharyngeal Swallow Severity: 1 (mild) 1: mild; 2: moderate; 3: severe; 4: profound  *The Dynamic Imaging Grade of Swallowing Toxicity is standardized for the head and neck cancer population, however, demonstrates promising clinical applications across populations to standardize the clinical rating of pharyngeal swallow safety and severity.  Factors that may increase risk of adverse event in presence of aspiration Noe & Lianne 2021):    Swallow Evaluation Recommendations Recommendations: PO diet PO Diet Recommendation: Regular;Thin liquids (Level 0) Liquid Administration via: Cup;Straw Medication Administration: Whole meds with liquid Supervision: Patient able to self-feed Swallowing strategies  : Minimize environmental distractions;Slow rate;Small bites/sips Postural changes: Position pt fully upright for meals;Stay upright 30-60 min after meals Oral care recommendations: Oral care BID (2x/day) Recommended consults: Consider esophageal assessment    Damien Blumenthal, M.A., CCC-SLP Speech Language Pathology, Acute Rehabilitation Services  Secure Chat preferred [phone]  11/15/2024,12:46 PM

## 2024-11-19 ENCOUNTER — Ambulatory Visit: Admitting: Occupational Therapy

## 2024-11-19 DIAGNOSIS — R278 Other lack of coordination: Secondary | ICD-10-CM

## 2024-11-19 DIAGNOSIS — M6281 Muscle weakness (generalized): Secondary | ICD-10-CM

## 2024-11-19 DIAGNOSIS — I69351 Hemiplegia and hemiparesis following cerebral infarction affecting right dominant side: Secondary | ICD-10-CM

## 2024-11-19 DIAGNOSIS — R208 Other disturbances of skin sensation: Secondary | ICD-10-CM
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
## 2024-11-20 ENCOUNTER — Encounter: Payer: Self-pay | Admitting: Internal Medicine

## 2024-11-21 ENCOUNTER — Ambulatory Visit

## 2024-11-21 ENCOUNTER — Encounter: Payer: Self-pay | Admitting: Internal Medicine

## 2024-11-21 DIAGNOSIS — R278 Other lack of coordination: Secondary | ICD-10-CM

## 2024-11-21 DIAGNOSIS — I69351 Hemiplegia and hemiparesis following cerebral infarction affecting right dominant side: Secondary | ICD-10-CM

## 2024-11-21 DIAGNOSIS — R208 Other disturbances of skin sensation: Secondary | ICD-10-CM

## 2024-11-21 LAB — BASIC METABOLIC PANEL WITH GFR
BUN/Creatinine Ratio: 12 (ref 10–24)
BUN: 15 mg/dL (ref 8–27)
CO2: 16 mmol/L — AB (ref 20–29)
Calcium: 9.5 mg/dL (ref 8.6–10.2)
Chloride: 99 mmol/L (ref 96–106)
Creatinine, Ser: 1.29 mg/dL — AB (ref 0.76–1.27)
Glucose: 180 mg/dL — AB (ref 70–99)
Potassium: 4.6 mmol/L (ref 3.5–5.2)
Sodium: 137 mmol/L (ref 134–144)
eGFR: 56 mL/min/1.73 — AB (ref >59)

## 2024-11-21 LAB — CBC
Hematocrit: 32.2 % — ABNORMAL LOW (ref 37.5–51.0)
Hemoglobin: 10.3 g/dL — ABNORMAL LOW (ref 13.0–17.7)
MCH: 32.1 pg (ref 26.6–33.0)
MCHC: 32 g/dL (ref 31.5–35.7)
MCV: 100 fL — ABNORMAL HIGH (ref 79–97)
Platelets: 261 x10E3/uL (ref 150–450)
RBC: 3.21 x10E6/uL — ABNORMAL LOW (ref 4.14–5.80)
RDW: 12.3 % (ref 11.6–15.4)
WBC: 7.1 x10E3/uL (ref 3.4–10.8)
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
## 2024-11-22 ENCOUNTER — Ambulatory Visit

## 2024-11-22 DIAGNOSIS — R278 Other lack of coordination: Secondary | ICD-10-CM | POA: Diagnosis not present

## 2024-11-22 DIAGNOSIS — R1312 Dysphagia, oropharyngeal phase: Secondary | ICD-10-CM
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
## 2024-11-26 ENCOUNTER — Ambulatory Visit: Admitting: Occupational Therapy

## 2024-11-26 ENCOUNTER — Ambulatory Visit

## 2024-11-26 DIAGNOSIS — R278 Other lack of coordination: Secondary | ICD-10-CM

## 2024-11-26 DIAGNOSIS — R208 Other disturbances of skin sensation: Secondary | ICD-10-CM

## 2024-11-26 DIAGNOSIS — M6281 Muscle weakness (generalized): Secondary | ICD-10-CM

## 2024-11-26 DIAGNOSIS — I69351 Hemiplegia and hemiparesis following cerebral infarction affecting right dominant side: Secondary | ICD-10-CM
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
## 2024-11-28 ENCOUNTER — Ambulatory Visit

## 2024-11-28 DIAGNOSIS — M6281 Muscle weakness (generalized): Secondary | ICD-10-CM

## 2024-11-28 DIAGNOSIS — I69351 Hemiplegia and hemiparesis following cerebral infarction affecting right dominant side: Secondary | ICD-10-CM

## 2024-11-28 DIAGNOSIS — R278 Other lack of coordination: Secondary | ICD-10-CM

## 2024-11-28 DIAGNOSIS — R208 Other disturbances of skin sensation: Secondary | ICD-10-CM

## 2024-11-28 NOTE — Patient Instructions (Addendum)
   Desensitization Techniques  The goal of desensitization and resensitization is to inhibit or interrupt the body's interpretation of routine stimuli as painful. It does not ensure that these stimuli will become pleasant or enjoyable or that your feeling will be completely normal, but that they will no longer provoke an extreme pain response, that you are more sensory aware and that you are safe despite any residual deficits.    One way to desensitize a painful incision or tingly area OR to resensitize a numb area is by rubbing it with different textures. This will make your limb more tolerant to touch and pressure. Before you begin, make sure your hands and the materials you're using are clean.  To rub your painful/sensitive/numb/tingly area with different textures:  Sit in a comfortable position with the painful/sensitive/numb/tingly area uncovered. Start with a material that is soft, such as a cotton ball, kleenex, silk soft towel q-tips, sponge, or shaving cream,. Rub your painful/sensitive/tingly area in all directions. Start with a light pressure and gradually increase the pressure. Vary the textures you use as you are able to tolerate them. Start with soft materials like cotton balls or a makeup brush. Progress to materials that are rougher. Examples include a brush, rough towel, warm wash clothes, velcro, toothbrush, erasers, gloves, tennis ball, paint brush, wool, cotton balls, leather, rubber, etc. As you progress, gradually increase the pressure and roughness of the texture you use. Be careful not to rub over an incision if you still have staples or sutures in place, or if there are any open areas. Rub your limb for at least 30 seconds progressing up to a minute or two as often as you can tolerate, or as recommended by your healthcare provider.  Other methods for desensitization include: Brushing: use a hair brush or combing in circular or sweeping motions over the area Tapping: use your  hand to tap or pat the area Towel rubs: use a dry towel and rub the area in circular or sweeping motions Massage: massage the area with your hands, may use lotion Vibration: apply home massager or other vibration tool over area Light touch: gently "tickle" the area with your fingertips Allow cool or warm water to run over the area. Dipping your affected area into a bowl of dry rice, sand, kidney beans, cold water or warm water. If you are able to dip your affected area into a medium such as rice, sand, or water - make sure to move the affected area around in the bowl until you cannot tolerate it or you reach one to two minutes, whichever comes first.  Use mental imagery and verbal/mental feedback ie) look at the object and tell yourself what the surface property/texture is while exploring the different objects/textures with affected digits.  Remember to apply regular stimulus to the affected area of the body for short durations, frequently throughout the day to increase sensory input to the brain ie)  use a combination of these methods for 10 to 15 minutes, 3-4 times per day.  Vary the objects you use, the difficulty of tasks (with and without vision) and try to incorporate sensory stimulation during different activities throughout the day/week. With time, the body will acclimate to the sensation, leading to a decrease in the body's pain response or hypersensitivity to the stimulus.
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
## 2024-11-29 ENCOUNTER — Encounter: Payer: Self-pay | Admitting: Internal Medicine

## 2024-11-30 NOTE — Progress Notes (Addendum)
 Pt called for pre procedure instructions. Arrival time 0745 NPO after midnight explained Instructed to take am meds with sip of water and confirmed blood thinner consistency Instructed pt need for ride home tomorrow and have responsible adult with them for 24 hrs post procedure.

## 2024-12-03 ENCOUNTER — Encounter (HOSPITAL_COMMUNITY): Payer: Self-pay | Admitting: Internal Medicine

## 2024-12-03 ENCOUNTER — Ambulatory Visit (HOSPITAL_COMMUNITY): Admitting: Certified Registered Nurse Anesthetist

## 2024-12-03 ENCOUNTER — Encounter (HOSPITAL_COMMUNITY): Admission: RE | Payer: Self-pay | Source: Home / Self Care

## 2024-12-03 ENCOUNTER — Ambulatory Visit (HOSPITAL_COMMUNITY)
Admission: RE | Admit: 2024-12-03 | Discharge: 2024-12-03 | Disposition: A | Discharge status: Home / Self Care | Attending: Internal Medicine | Admitting: Internal Medicine

## 2024-12-03 ENCOUNTER — Other Ambulatory Visit: Payer: Self-pay

## 2024-12-03 ENCOUNTER — Ambulatory Visit (HOSPITAL_COMMUNITY)

## 2024-12-03 DIAGNOSIS — I110 Hypertensive heart disease with heart failure: Secondary | ICD-10-CM | POA: Diagnosis not present

## 2024-12-03 DIAGNOSIS — Z85820 Personal history of malignant melanoma of skin: Secondary | ICD-10-CM | POA: Insufficient documentation

## 2024-12-03 DIAGNOSIS — E119 Type 2 diabetes mellitus without complications: Secondary | ICD-10-CM | POA: Insufficient documentation

## 2024-12-03 DIAGNOSIS — Z79899 Other long term (current) drug therapy: Secondary | ICD-10-CM | POA: Diagnosis not present

## 2024-12-03 DIAGNOSIS — J9601 Acute respiratory failure with hypoxia: Secondary | ICD-10-CM

## 2024-12-03 DIAGNOSIS — I2510 Atherosclerotic heart disease of native coronary artery without angina pectoris: Secondary | ICD-10-CM | POA: Diagnosis not present

## 2024-12-03 DIAGNOSIS — Z8673 Personal history of transient ischemic attack (TIA), and cerebral infarction without residual deficits: Secondary | ICD-10-CM | POA: Insufficient documentation

## 2024-12-03 DIAGNOSIS — I4891 Unspecified atrial fibrillation: Secondary | ICD-10-CM | POA: Diagnosis not present

## 2024-12-03 DIAGNOSIS — Z87891 Personal history of nicotine dependence: Secondary | ICD-10-CM

## 2024-12-03 DIAGNOSIS — I4819 Other persistent atrial fibrillation: Secondary | ICD-10-CM | POA: Diagnosis present

## 2024-12-03 DIAGNOSIS — I509 Heart failure, unspecified: Secondary | ICD-10-CM | POA: Insufficient documentation

## 2024-12-03 DIAGNOSIS — Z7901 Long term (current) use of anticoagulants: Secondary | ICD-10-CM | POA: Insufficient documentation

## 2024-12-03 DIAGNOSIS — D6869 Other thrombophilia: Secondary | ICD-10-CM | POA: Diagnosis not present

## 2024-12-03 DIAGNOSIS — I4892 Unspecified atrial flutter: Secondary | ICD-10-CM | POA: Insufficient documentation

## 2024-12-03 HISTORY — PX: TRANSESOPHAGEAL ECHOCARDIOGRAM (CATH LAB): EP1270

## 2024-12-03 HISTORY — PX: CARDIOVERSION: EP1203

## 2024-12-03 LAB — ECHO TEE

## 2024-12-03 SURGERY — CARDIOVERSION (CATH LAB)
Anesthesia: Monitor Anesthesia Care

## 2024-12-03 MED ORDER — SODIUM CHLORIDE 0.9 % IV SOLN: INTRAVENOUS | Status: DC | PRN

## 2024-12-03 MED ORDER — SODIUM CHLORIDE 0.9 % IV SOLN: INTRAVENOUS | Status: DC

## 2024-12-03 MED ORDER — PHENYLEPHRINE 80 MCG/ML (10ML) SYRINGE FOR IV PUSH (FOR BLOOD PRESSURE SUPPORT)
PREFILLED_SYRINGE | INTRAVENOUS | Status: DC | PRN
  Administered 2024-12-03: 80 ug via INTRAVENOUS

## 2024-12-03 MED ORDER — PROPOFOL 500 MG/50ML IV EMUL
INTRAVENOUS | Status: DC | PRN
  Administered 2024-12-03: 75 ug/kg/min via INTRAVENOUS

## 2024-12-03 MED ORDER — PROPOFOL 10 MG/ML IV BOLUS
INTRAVENOUS | Status: DC | PRN
  Administered 2024-12-03: 20 mg via INTRAVENOUS
  Administered 2024-12-03: 30 mg via INTRAVENOUS

## 2024-12-03 SURGICAL SUPPLY — 1 items: PAD DEFIB RADIO PHYSIO CONN (PAD) ×1 IMPLANT

## 2024-12-03 NOTE — CV Procedure (Signed)
 INDICATIONS: afib  PROCEDURE:   Informed consent was obtained prior to the procedure. The risks, benefits and alternatives for the procedure were discussed and the patient comprehended these risks.  Risks include, but are not limited to, cough, sore throat, vomiting, nausea, somnolence, esophageal and stomach trauma or perforation, bleeding, low blood pressure, aspiration, pneumonia, infection, trauma to the teeth and death.    Procedural time out performed.  During this procedure the patient was administered propofol  per anesthesia.  The patient's heart rate, blood pressure, and oxygen saturation were monitored continuously during the procedure. The period of conscious sedation was 10 minutes, of which I was present face-to-face 100% of this time.  The transesophageal probe was inserted in the esophagus without difficulty and multiple views were obtained.  The patient was kept under observation until the patient left the procedure room.  The patient left the procedure room in stable condition.   Agitated microbubble saline contrast was not administered.  COMPLICATIONS:    There were no immediate complications.  FINDINGS:   FORMAL ECHOCARDIOGRAM REPORT PENDING Limited TEE EF 60% Small LAA with no thrombus.  RECOMMENDATIONS:     Proceed to DCCV  Procedure: Electrical Cardioversion Indications:  Atrial Fibrillation  Procedure Details:  Consent: Risks of procedure as well as the alternatives and risks of each were explained to the (patient/caregiver).  Consent for procedure obtained.  Time Out: Verified patient identification, verified procedure, site/side was marked, verified correct patient position, special equipment/implants available, medications/allergies/relevent history reviewed, required imaging and test results available. PERFORMED.  Patient placed on cardiac monitor, pulse oximetry, supplemental oxygen as necessary.  Sedation given: propofol  per anesthesia Pacer pads  placed anterior and posterior chest.  Cardioverted 1 time(s).  Cardioversion with synchronized biphasic 200J shock.  Evaluation: Findings: Post procedure EKG shows: NSR Complications: None Patient did tolerate procedure well.  Time Spent Directly with the Patient:  20 minutes   Sherry Rogus A Katryn Plummer 12/03/2024, 8:35 AM

## 2024-12-03 NOTE — Discharge Instructions (Signed)

## 2024-12-03 NOTE — Interval H&P Note (Signed)
 History and Physical Interval Note:  12/03/2024 7:59 AM  [name] [name]  has presented today for surgery, with the diagnosis of AFIB.  The various methods of treatment have been discussed with the patient and family. After consideration of risks, benefits and other options for treatment, the patient has consented to  Procedures: CARDIOVERSION (N/A) as a surgical intervention.  The patient's history has been reviewed, patient examined, no change in status, stable for surgery.  I have reviewed the patient's chart and labs.  Questions were answered to the patient's satisfaction.     Jerame Hedding A [place]

## 2024-12-03 NOTE — Anesthesia Preprocedure Evaluation (Signed)
 "                                  Anesthesia Evaluation  Patient identified by MRN, date of birth, ID band Patient awake    Reviewed: Allergy & Precautions, NPO status , Patient's Chart, lab work & pertinent test results, reviewed documented beta blocker date and time   History of Anesthesia Complications Negative for: history of anesthetic complications  Airway Mallampati: III  TM Distance: >3 FB Neck ROM: Full    Dental  (+) Dental Advisory Given, Teeth Intact   Pulmonary shortness of breath, neg sleep apnea, neg COPD, neg recent URI, former smoker   breath sounds clear to auscultation       Cardiovascular (-) angina +CHF  + dysrhythmias Atrial Fibrillation  Rhythm:Irregular   1. Left ventricular ejection fraction, by estimation, is 60 to 65%. The  left ventricle has normal function. The left ventricle has no regional  wall motion abnormalities. Left ventricular diastolic parameters are  indeterminate.   2. Right ventricular systolic function is normal. The right ventricular  size is normal. There is moderately elevated pulmonary artery systolic  pressure.   3. The mitral valve is normal in structure. No evidence of mitral valve  regurgitation. No evidence of mitral stenosis.   4. The aortic valve is normal in structure. Aortic valve regurgitation is  not visualized. No aortic stenosis is present.   5. The inferior vena cava is dilated in size with <50% respiratory  variability, suggesting right atrial pressure of 15 mmHg.      Neuro/Psych neg Seizures CVA, Residual Symptoms    GI/Hepatic negative GI ROS, Neg liver ROS,,,  Endo/Other  diabetes, Type 2  Lab Results      Component                Value               Date                      HGBA1C                   7.3 (H)             01/15/2022             Renal/GU Renal InsufficiencyRenal diseaseLab Results      Component                Value               Date                      NA                        137                 11/20/2024                K                        4.6                 11/20/2024                CO2  16 (L)              11/20/2024                GLUCOSE                  180 (H)             11/20/2024                BUN                      15                  11/20/2024                CREATININE               1.29 (H)            11/20/2024                CALCIUM                   9.5                 11/20/2024                EGFR                     56 (L)              11/20/2024                GFRNONAA                 >60                 07/09/2024                Musculoskeletal negative musculoskeletal ROS (+)    Abdominal   Peds  Hematology  (+) Blood dyscrasia, anemia Lab Results      Component                Value               Date                      WBC                      7.1                 11/20/2024                HGB                      10.3 (L)            11/20/2024                HCT                      32.2 (L)            11/20/2024                MCV                      100 (H)  11/20/2024                PLT                      261                 11/20/2024              Anesthesia Other Findings   Reproductive/Obstetrics                              Anesthesia Physical Anesthesia Plan  ASA: 3  Anesthesia Plan: MAC and General   Post-op Pain Management: Minimal or no pain anticipated   Induction: Intravenous  PONV Risk Score and Plan: 2 and Propofol  infusion  Airway Management Planned: Nasal Cannula, Natural Airway and Simple Face Mask  Additional Equipment: None  Intra-op Plan:   Post-operative Plan:   Informed Consent: I have reviewed the patients History and Physical, chart, labs and discussed the procedure including the risks, benefits and alternatives for the proposed anesthesia with the patient or authorized representative who has indicated his/her  understanding and acceptance.     Dental advisory given  Plan Discussed with: CRNA  Anesthesia Plan Comments:          Anesthesia Quick Evaluation  "

## 2024-12-03 NOTE — Interval H&P Note (Signed)
 History and Physical Interval Note:  12/03/2024 8:17 AM  [name] [name]  has presented today for surgery, with the diagnosis of AFIB.  The various methods of treatment have been discussed with the patient and family. After consideration of risks, benefits and other options for treatment, the patient has consented to  Procedures: CARDIOVERSION (N/A) as a surgical intervention.  The patient's history has been reviewed, patient examined, no change in status, stable for surgery.  I have reviewed the patient's chart and labs.  Questions were answered to the patient's satisfaction.    Transesophageal echo added given possible missed dose of eliquis  last week. Patient cannot be 100% sure, therefore we consented for TEE.  After careful review of history and examination, the risks and benefits of transesophageal echocardiogram have been explained including risks of esophageal damage, perforation (1:10,000 risk), bleeding, pharyngeal hematoma as well as other potential complications associated with conscious sedation including aspiration, arrhythmia, respiratory failure and death. Alternatives to treatment were discussed, questions were answered. Patient is willing to proceed. Risks, benefits and alternatives of direct current cardioversion reviewed including potential for post-cardioversion rhythms, especially life-threatening arrhythmias (ventricular tachycardia and fibrillation, profound bradycardia). Major complications may include serious or fatal arrhythmias, myocardial damage, and acute pulmonary edema; minor complications include skin burns and transient hypotension. Benefits include restoration of sinus rhythm. Alternatives to treatment were discussed, questions were answered. Patient is willing to proceed.    Soyla DELENA Merck 8:17 AM 12/03/2024

## 2024-12-03 NOTE — Transfer of Care (Signed)
 Immediate Anesthesia Transfer of Care Note  Patient: [name] [name]  Procedure(s) Performed: CARDIOVERSION TRANSESOPHAGEAL ECHOCARDIOGRAM  Patient Location: Cath Lab  Anesthesia Type:MAC  Level of Consciousness: drowsy and patient cooperative  Airway & Oxygen Therapy: Patient Spontanous Breathing and Patient connected to nasal cannula oxygen  Post-op Assessment: Report given to RN, Post -op Vital signs reviewed and stable, and Patient moving all extremities X 4  Post vital signs: Reviewed and stable  Last Vitals:  Vitals Value Taken Time  BP 132/64   Temp    Pulse 70   Resp 13   SpO2 98     Last Pain:  Vitals:   12/03/24 0702  TempSrc: Temporal         Complications: No notable events documented.

## 2024-12-05 ENCOUNTER — Encounter (HOSPITAL_COMMUNITY): Payer: Self-pay | Admitting: Internal Medicine

## 2024-12-05 ENCOUNTER — Ambulatory Visit

## 2024-12-05 NOTE — Anesthesia Postprocedure Evaluation (Signed)
"   Anesthesia Post Note  Patient: [name] [name]  Procedure(s) Performed: CARDIOVERSION TRANSESOPHAGEAL ECHOCARDIOGRAM     Patient location during evaluation: Cath Lab Anesthesia Type: MAC and General Level of consciousness: awake and alert Pain management: pain level controlled Vital Signs Assessment: post-procedure vital signs reviewed and stable Respiratory status: spontaneous breathing, nonlabored ventilation and respiratory function stable Cardiovascular status: blood pressure returned to baseline and stable Postop Assessment: no apparent nausea or vomiting Anesthetic complications: no   No notable events documented.                  Lazar Tierce      "

## 2024-12-10 ENCOUNTER — Ambulatory Visit: Attending: Cardiovascular Disease | Admitting: Cardiovascular Disease

## 2024-12-10 ENCOUNTER — Encounter: Payer: Self-pay | Admitting: Internal Medicine

## 2024-12-10 ENCOUNTER — Encounter: Payer: Self-pay | Admitting: Cardiovascular Disease

## 2024-12-10 VITALS — BP 124/70 | HR 72 | Ht 71.0 in | Wt 152.2 lb

## 2024-12-10 DIAGNOSIS — D6869 Other thrombophilia: Secondary | ICD-10-CM

## 2024-12-10 DIAGNOSIS — I480 Paroxysmal atrial fibrillation: Secondary | ICD-10-CM | POA: Diagnosis not present

## 2024-12-10 DIAGNOSIS — E782 Mixed hyperlipidemia: Secondary | ICD-10-CM

## 2024-12-10 DIAGNOSIS — I4819 Other persistent atrial fibrillation: Secondary | ICD-10-CM

## 2024-12-10 DIAGNOSIS — I6381 Other cerebral infarction due to occlusion or stenosis of small artery: Secondary | ICD-10-CM | POA: Diagnosis not present

## 2024-12-10 NOTE — Patient Instructions (Addendum)
 Medication Instructions:  Your physician recommends that you continue on your current medications as directed. Please refer to the Current Medication list given to you today.  *If you need a refill on your cardiac medications before your next appointment, please call your pharmacy*  Lab Work: none If you have labs (blood work) drawn today and your tests are completely normal, you will receive your results only by: MyChart Message (if you have MyChart) OR A paper copy in the mail If you have any lab test that is abnormal or we need to change your treatment, we will call you to review the results.  Testing/Procedures: none  Follow-Up: At [facility], you and your health needs are our priority.  As part of our continuing mission to provide you with exceptional heart care, our providers are all part of one team.  This team includes your primary Cardiologist (physician) and Advanced Practice Providers or APPs (Physician Assistants and Nurse Practitioners) who all work together to provide you with the care you need, when you need it.  Your next appointment:   Follow up 3 months Dr. Inocencio and [facility]  Provider:   Dr. Delford 6 months   We recommend signing up for the patient portal called MyChart.  Sign up information is provided on this After Visit Summary.  MyChart is used to connect with patients for Virtual Visits (Telemedicine).  Patients are able to view lab/test results, encounter notes, upcoming appointments, etc.  Non-urgent messages can be sent to your provider as well.   To learn more about what you can do with MyChart, go to [email or web address].   Other Instructions none

## 2024-12-11 ENCOUNTER — Inpatient Hospital Stay

## 2024-12-11 ENCOUNTER — Other Ambulatory Visit: Payer: Self-pay

## 2024-12-12 ENCOUNTER — Ambulatory Visit

## 2024-12-12 DIAGNOSIS — R1312 Dysphagia, oropharyngeal phase: Secondary | ICD-10-CM

## 2024-12-12 DIAGNOSIS — R278 Other lack of coordination: Secondary | ICD-10-CM | POA: Diagnosis not present
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
## 2024-12-19 ENCOUNTER — Ambulatory Visit

## 2025-01-01 ENCOUNTER — Ambulatory Visit (HOSPITAL_COMMUNITY): Admitting: Internal Medicine

## 2025-01-03 ENCOUNTER — Encounter: Payer: Self-pay | Admitting: Internal Medicine

## 2025-01-04 ENCOUNTER — Encounter: Payer: Self-pay | Admitting: Internal Medicine

## 2025-01-08 ENCOUNTER — Inpatient Hospital Stay: Attending: Internal Medicine

## 2025-01-08 ENCOUNTER — Other Ambulatory Visit

## 2025-01-08 ENCOUNTER — Ambulatory Visit (HOSPITAL_COMMUNITY)
Admission: RE | Admit: 2025-01-08 | Discharge: 2025-01-08 | Disposition: A | Discharge status: Home / Self Care | Source: Ambulatory Visit | Attending: Internal Medicine | Admitting: Internal Medicine

## 2025-01-08 DIAGNOSIS — R918 Other nonspecific abnormal finding of lung field: Secondary | ICD-10-CM | POA: Insufficient documentation

## 2025-01-08 DIAGNOSIS — C7800 Secondary malignant neoplasm of unspecified lung: Secondary | ICD-10-CM | POA: Insufficient documentation

## 2025-01-08 DIAGNOSIS — C439 Malignant melanoma of skin, unspecified: Secondary | ICD-10-CM | POA: Diagnosis present

## 2025-01-08 LAB — CMP (CANCER CENTER ONLY)
ALT: 33 U/L (ref 0–44)
AST: 34 U/L (ref 15–41)
Albumin: 4.3 g/dL (ref 3.5–5.0)
Alkaline Phosphatase: 92 U/L (ref 38–126)
Anion gap: 14 (ref 5–15)
BUN: 21 mg/dL (ref 8–23)
CO2: 21 mmol/L — ABNORMAL LOW (ref 22–32)
Calcium: 9.5 mg/dL (ref 8.9–10.3)
Chloride: 100 mmol/L (ref 98–111)
Creatinine: 1.13 mg/dL (ref 0.61–1.24)
GFR, Estimated: >60 mL/min
Glucose, Bld: 88 mg/dL (ref 70–99)
Potassium: 4.1 mmol/L (ref 3.5–5.1)
Sodium: 136 mmol/L (ref 135–145)
Total Bilirubin: 0.5 mg/dL (ref 0.0–1.2)
Total Protein: 7.6 g/dL (ref 6.5–8.1)

## 2025-01-08 LAB — CBC WITH DIFFERENTIAL (CANCER CENTER ONLY)
Abs Immature Granulocytes: 0.03 10*3/uL (ref 0.00–0.07)
Basophils Absolute: 0 10*3/uL (ref 0.0–0.1)
Basophils Relative: 0 %
Eosinophils Absolute: 0.2 10*3/uL (ref 0.0–0.5)
Eosinophils Relative: 4 %
HCT: 30.5 % — ABNORMAL LOW (ref 39.0–52.0)
Hemoglobin: 10.4 g/dL — ABNORMAL LOW (ref 13.0–17.0)
Immature Granulocytes: 1 %
Lymphocytes Relative: 25 %
Lymphs Abs: 1.4 10*3/uL (ref 0.7–4.0)
MCH: 33.1 pg (ref 26.0–34.0)
MCHC: 34.1 g/dL (ref 30.0–36.0)
MCV: 97.1 fL (ref 80.0–100.0)
Monocytes Absolute: 0.6 10*3/uL (ref 0.1–1.0)
Monocytes Relative: 12 %
Neutro Abs: 3.3 10*3/uL (ref 1.7–7.7)
Neutrophils Relative %: 58 %
Platelet Count: 201 10*3/uL (ref 150–400)
RBC: 3.14 MIL/uL — ABNORMAL LOW (ref 4.22–5.81)
RDW: 16.2 % — ABNORMAL HIGH (ref 11.5–15.5)
WBC Count: 5.5 10*3/uL (ref 4.0–10.5)
nRBC: 0 % (ref 0.0–0.2)

## 2025-01-08 MED ORDER — HEPARIN SOD (PORK) LOCK FLUSH 100 UNIT/ML IV SOLN
500.0000 [IU] | Freq: Once | INTRAVENOUS | Status: AC
  Administered 2025-01-08: 500 [IU] via INTRAVENOUS

## 2025-01-08 MED ORDER — IOHEXOL 300 MG/ML  SOLN
100.0000 mL | Freq: Once | INTRAMUSCULAR | Status: AC | PRN
  Administered 2025-01-08: 100 mL via INTRAVENOUS

## 2025-01-08 MED ORDER — HEPARIN SOD (PORK) LOCK FLUSH 100 UNIT/ML IV SOLN
INTRAVENOUS | Status: AC
  Filled 2025-01-08: qty 5

## 2025-01-11 NOTE — Progress Notes (Unsigned)
 SABRA

## 2025-01-15 ENCOUNTER — Telehealth: Payer: Self-pay | Admitting: Cardiology

## 2025-01-15 ENCOUNTER — Inpatient Hospital Stay: Attending: Internal Medicine | Admitting: Internal Medicine

## 2025-01-15 DIAGNOSIS — Z923 Personal history of irradiation: Secondary | ICD-10-CM

## 2025-01-15 DIAGNOSIS — C7801 Secondary malignant neoplasm of right lung: Secondary | ICD-10-CM

## 2025-01-15 DIAGNOSIS — C439 Malignant melanoma of skin, unspecified: Secondary | ICD-10-CM

## 2025-01-15 DIAGNOSIS — Z9221 Personal history of antineoplastic chemotherapy: Secondary | ICD-10-CM

## 2025-01-15 DIAGNOSIS — Z8673 Personal history of transient ischemic attack (TIA), and cerebral infarction without residual deficits: Secondary | ICD-10-CM

## 2025-01-15 DIAGNOSIS — C7800 Secondary malignant neoplasm of unspecified lung: Secondary | ICD-10-CM

## 2025-01-15 NOTE — Telephone Encounter (Signed)
 Pt c/o medication issue:  1. Name of Medication: amiodarone  (PACERONE ) 200 MG tablet   2. How are you currently taking this medication (dosage and times per day)? As written   3. Are you having a reaction (difficulty breathing--STAT)? No  4. What is your medication issue? Pt states this medication may be causing him to be dizzy and itching all over his body

## 2025-01-15 NOTE — Telephone Encounter (Signed)
 He reports itching for the last 3 years, but it has increased in intensity the last several weeks.  Itching started when he was on immunotherapy years ago. States dizziness has occurred the last 4 mornings. Advised to contact PCP to further discuss, made aware discussed with pharmD and do not think this is related to Amiodarone  as he was loaded and started on this beginning of December.  Advised to call back if PCP feels we need to readdress.  Aware will forward to MD to review and send other advisement, if any.

## 2025-03-11 ENCOUNTER — Ambulatory Visit (HOSPITAL_COMMUNITY): Admitting: Nurse Practitioner

## 2025-07-08 ENCOUNTER — Inpatient Hospital Stay: Attending: Internal Medicine

## 2025-07-15 ENCOUNTER — Inpatient Hospital Stay: Attending: Internal Medicine | Admitting: Internal Medicine
